# Patient Record
Sex: Female | Born: 1940 | Race: White | Hispanic: No | Marital: Married | Smoking: Former smoker
Health system: Southern US, Community
[De-identification: ages and names within clinical notes are randomized; demographics above are authoritative.]

## PROBLEM LIST (undated history)

## (undated) DIAGNOSIS — N200 Calculus of kidney: Secondary | ICD-10-CM

## (undated) DIAGNOSIS — R51 Headache: Secondary | ICD-10-CM

## (undated) DIAGNOSIS — Z5189 Encounter for other specified aftercare: Secondary | ICD-10-CM

## (undated) DIAGNOSIS — K219 Gastro-esophageal reflux disease without esophagitis: Secondary | ICD-10-CM

## (undated) DIAGNOSIS — I2510 Atherosclerotic heart disease of native coronary artery without angina pectoris: Secondary | ICD-10-CM

## (undated) DIAGNOSIS — IMO0001 Reserved for inherently not codable concepts without codable children: Secondary | ICD-10-CM

## (undated) DIAGNOSIS — M2550 Pain in unspecified joint: Secondary | ICD-10-CM

## (undated) DIAGNOSIS — D649 Anemia, unspecified: Secondary | ICD-10-CM

## (undated) DIAGNOSIS — I6529 Occlusion and stenosis of unspecified carotid artery: Secondary | ICD-10-CM

## (undated) DIAGNOSIS — M1990 Unspecified osteoarthritis, unspecified site: Secondary | ICD-10-CM

## (undated) DIAGNOSIS — H547 Unspecified visual loss: Secondary | ICD-10-CM

## (undated) DIAGNOSIS — I10 Essential (primary) hypertension: Secondary | ICD-10-CM

## (undated) DIAGNOSIS — E7800 Pure hypercholesterolemia, unspecified: Secondary | ICD-10-CM

## (undated) DIAGNOSIS — R0602 Shortness of breath: Secondary | ICD-10-CM

## (undated) HISTORY — DX: Pure hypercholesterolemia, unspecified: E78.00

## (undated) HISTORY — PX: EYE SURGERY: SHX253

## (undated) HISTORY — PX: CARDIAC CATHETERIZATION: SHX172

## (undated) HISTORY — DX: Unspecified osteoarthritis, unspecified site: M19.90

## (undated) HISTORY — DX: Occlusion and stenosis of unspecified carotid artery: I65.29

## (undated) HISTORY — DX: Essential (primary) hypertension: I10

## (undated) HISTORY — DX: Shortness of breath: R06.02

## (undated) HISTORY — PX: ABDOMINAL HYSTERECTOMY: SHX81

## (undated) HISTORY — PX: CORONARY ARTERY BYPASS GRAFT: SHX141

## (undated) HISTORY — DX: Pain in unspecified joint: M25.50

## (undated) HISTORY — PX: CAROTID ARTERY - SUBCLAVIAN ARTERY BYPASS GRAFT: SUR178

## (undated) HISTORY — DX: Anemia, unspecified: D64.9

## (undated) HISTORY — DX: Atherosclerotic heart disease of native coronary artery without angina pectoris: I25.10

## (undated) HISTORY — PX: PR VEIN BYPASS GRAFT,AORTO-FEM-POP: 35551

## (undated) HISTORY — PX: CARDIOVASCULAR STRESS TEST: SHX262

## (undated) HISTORY — PX: BREAST BIOPSY: SHX20

## (undated) HISTORY — DX: Unspecified visual loss: H54.7

---

## 2005-04-20 ENCOUNTER — Observation Stay (HOSPITAL_COMMUNITY): Admission: RE | Admit: 2005-04-20 | Discharge: 2005-04-20 | Payer: Self-pay | Admitting: Gastroenterology

## 2009-11-15 DIAGNOSIS — I2510 Atherosclerotic heart disease of native coronary artery without angina pectoris: Secondary | ICD-10-CM

## 2009-11-15 HISTORY — DX: Atherosclerotic heart disease of native coronary artery without angina pectoris: I25.10

## 2009-12-12 ENCOUNTER — Encounter: Admission: RE | Admit: 2009-12-12 | Discharge: 2009-12-12 | Payer: Self-pay | Admitting: Family Medicine

## 2009-12-23 ENCOUNTER — Inpatient Hospital Stay (HOSPITAL_COMMUNITY): Admission: EM | Admit: 2009-12-23 | Discharge: 2010-01-05 | Payer: Self-pay | Admitting: Emergency Medicine

## 2009-12-24 ENCOUNTER — Ambulatory Visit: Payer: Self-pay | Admitting: Surgery

## 2009-12-25 ENCOUNTER — Encounter: Payer: Self-pay | Admitting: Surgery

## 2009-12-25 ENCOUNTER — Encounter (INDEPENDENT_AMBULATORY_CARE_PROVIDER_SITE_OTHER): Payer: Self-pay | Admitting: Cardiovascular Disease

## 2010-01-27 ENCOUNTER — Encounter
Admission: RE | Admit: 2010-01-27 | Discharge: 2010-01-27 | Payer: Self-pay | Source: Home / Self Care | Attending: Surgery | Admitting: Surgery

## 2010-01-27 ENCOUNTER — Ambulatory Visit: Payer: Self-pay | Admitting: Surgery

## 2010-02-11 ENCOUNTER — Ambulatory Visit: Payer: Self-pay | Admitting: Surgery

## 2010-02-28 ENCOUNTER — Emergency Department (HOSPITAL_COMMUNITY)
Admission: EM | Admit: 2010-02-28 | Discharge: 2010-02-28 | Payer: Self-pay | Source: Home / Self Care | Admitting: Emergency Medicine

## 2010-03-02 LAB — CBC
HCT: 38.1 % (ref 36.0–46.0)
Hemoglobin: 12.7 g/dL (ref 12.0–15.0)
MCH: 30.7 pg (ref 26.0–34.0)
MCHC: 33.3 g/dL (ref 30.0–36.0)
MCV: 92 fL (ref 78.0–100.0)
Platelets: 263 10*3/uL (ref 150–400)
RBC: 4.14 MIL/uL (ref 3.87–5.11)
RDW: 15 % (ref 11.5–15.5)
WBC: 8.5 10*3/uL (ref 4.0–10.5)

## 2010-03-02 LAB — DIFFERENTIAL
Basophils Absolute: 0 10*3/uL (ref 0.0–0.1)
Basophils Relative: 1 % (ref 0–1)
Eosinophils Absolute: 0.2 10*3/uL (ref 0.0–0.7)
Eosinophils Relative: 3 % (ref 0–5)
Lymphocytes Relative: 26 % (ref 12–46)
Lymphs Abs: 2.2 10*3/uL (ref 0.7–4.0)
Monocytes Absolute: 0.4 10*3/uL (ref 0.1–1.0)
Monocytes Relative: 5 % (ref 3–12)
Neutro Abs: 5.6 10*3/uL (ref 1.7–7.7)
Neutrophils Relative %: 67 % (ref 43–77)

## 2010-03-02 LAB — BASIC METABOLIC PANEL
BUN: 16 mg/dL (ref 6–23)
CO2: 23 mEq/L (ref 19–32)
Calcium: 9.9 mg/dL (ref 8.4–10.5)
Chloride: 104 mEq/L (ref 96–112)
Creatinine, Ser: 0.97 mg/dL (ref 0.4–1.2)
GFR calc Af Amer: >60 mL/min (ref >60)
GFR calc non Af Amer: 57 mL/min — ABNORMAL LOW (ref >60)
Glucose, Bld: 117 mg/dL — ABNORMAL HIGH (ref 70–99)
Potassium: 4.1 mEq/L (ref 3.5–5.1)
Sodium: 136 mEq/L (ref 135–145)

## 2010-03-16 ENCOUNTER — Ambulatory Visit: Admit: 2010-03-16 | Payer: Self-pay | Admitting: Surgery

## 2010-03-16 ENCOUNTER — Ambulatory Visit
Admission: RE | Admit: 2010-03-16 | Discharge: 2010-03-16 | Payer: Self-pay | Source: Home / Self Care | Attending: Surgery | Admitting: Surgery

## 2010-03-17 NOTE — Assessment & Plan Note (Signed)
OFFICE VISIT  Jennifer [name], Jennifer [name] DOB:  [birth year]                                       03/16/2010 NWGNF#:[id number]  The patient returns today of followup.  She is status post coronary artery bypass graft x4 in combination of arch reconstruction consisting of aorta to innominate aorta left carotid and aorta left subclavian bypass graft.  This was done on 12/30/2009.  By preoperative imaging she was found to have bilateral carotid stenosis which we did not address at the time of her arch reconstruction.  She comes back in today for followup of her carotid disease to be done with ultrasound.  In the meantime she has been sent to be seen by ENT to make sure she had full vocal cord function.  She was cleared by ENT.  She recently went to the hospital for severe headache.  She had a CT angiogram of her head to rule out subarachnoid hemorrhage.  This was negative.  She still is having 1 to 2 headaches a day.  She is not having any fever.  She does have some night sweats.  She is scheduled to see a neurologist and headache specialist at the end of February.  PHYSICAL EXAMINATION:  She has palpable radial pulses bilaterally.  Her incision is well-healed.  Neurologically she is intact.  Carotid duplex was performed today that shows bilateral 60% to 79% stenosis.  ASSESSMENT:  Status post coronary artery bypass graft and aortic arch reconstruction, bilateral carotid disease.  Based on her duplex today she has bilateral carotid stenosis in the 60% to 79% range.  I believe that she is asymptomatic at this time.  I would recommend continued medical management of her carotid occlusive disease with plans for followup in 6 months with a repeat ultrasound.  I do not feel that her headaches are related to her carotid disease.  I agree with having her see a headache specialist in the near future.  This has been scheduled by Dr. Nathanial Rancher.  The patient is concerned that  her metoprolol may be causing her symptoms.  Therefore I agreed to switch her metoprolol to atenolol.  I plan on seeing her back in 6 months.    Jorge Ny, MD Electronically Signed  VWB/MEDQ  D:  03/16/2010  T:  03/17/2010  Job:  [id number]

## 2010-03-30 NOTE — Procedures (Unsigned)
CAROTID DUPLEX EXAM  INDICATION:  Carotid stenosis.  HISTORY: Diabetes:  No. Cardiac:  CABG x4. Hypertension:  Yes. Smoking:  Previous, quit November 2011. Previous Surgery:  No. CV History:  Currently complaining of excruciating headaches. Amaurosis Fugax No, Paresthesias No, Hemiparesis No Other:  Hyperlipidemia.                                      RIGHT             LEFT Brachial systolic pressure:         154               149 Brachial Doppler waveforms:         Normal            Normal Vertebral direction of flow:        Antegrade         Antegrade DUPLEX VELOCITIES (cm/sec) CCA peak systolic                   57                69 ECA peak systolic                   95                187 ICA peak systolic                   215               236 ICA end diastolic                   101               83 PLAQUE MORPHOLOGY:                  Mixed             Mixed PLAQUE AMOUNT:                      Moderate          Moderate PLAQUE LOCATION:                    CCA, ICA, ECA     CCA, ICA, ECA  IMPRESSION: 1. Bilateral internal carotid artery velocities suggest 60% to 70%     stenosis. 2. Left external carotid artery stenosis. 3. Right common carotid artery stenosis, without any hemodynamically     significant velocities.  ___________________________________________ V. Charlena Cross, MD  EM/MEDQ  D:  03/16/2010  T:  03/16/2010  Job:  [id number]

## 2010-04-28 LAB — FIBRINOGEN: Fibrinogen: 227 mg/dL (ref 204–475)

## 2010-04-28 LAB — POCT I-STAT 3, ART BLOOD GAS (G3+)
Acid-base deficit: 6 mmol/L — ABNORMAL HIGH (ref 0.0–2.0)
Bicarbonate: 22.3 mEq/L (ref 20.0–24.0)
Bicarbonate: 22.6 mEq/L (ref 20.0–24.0)
Bicarbonate: 24.8 mEq/L — ABNORMAL HIGH (ref 20.0–24.0)
O2 Saturation: 100 %
O2 Saturation: 95 %
Patient temperature: 36
TCO2: 20 mmol/L (ref 0–100)
TCO2: 23 mmol/L (ref 0–100)
pCO2 arterial: 34.6 mmHg — ABNORMAL LOW (ref 35.0–45.0)
pCO2 arterial: 45.3 mmHg — ABNORMAL HIGH (ref 35.0–45.0)
pCO2 arterial: 56.7 mmHg — ABNORMAL HIGH (ref 35.0–45.0)
pH, Arterial: 7.201 — ABNORMAL LOW (ref 7.350–7.400)
pH, Arterial: 7.346 — ABNORMAL LOW (ref 7.350–7.400)
pH, Arterial: 7.347 — ABNORMAL LOW (ref 7.350–7.400)
pO2, Arterial: 394 mmHg — ABNORMAL HIGH (ref 80.0–100.0)
pO2, Arterial: 79 mmHg — ABNORMAL LOW (ref 80.0–100.0)
pO2, Arterial: 98 mmHg (ref 80.0–100.0)

## 2010-04-28 LAB — LIPID PANEL
Cholesterol: 175 mg/dL (ref 0–200)
Cholesterol: 190 mg/dL (ref 0–200)
LDL Cholesterol: 100 mg/dL — ABNORMAL HIGH (ref 0–99)
LDL Cholesterol: 127 mg/dL — ABNORMAL HIGH (ref 0–99)
Triglycerides: 129 mg/dL (ref <150)
Triglycerides: 151 mg/dL — ABNORMAL HIGH (ref <150)
VLDL: 30 mg/dL (ref 0–40)

## 2010-04-28 LAB — CBC
HCT: 27.8 % — ABNORMAL LOW (ref 36.0–46.0)
HCT: 28.1 % — ABNORMAL LOW (ref 36.0–46.0)
HCT: 33.9 % — ABNORMAL LOW (ref 36.0–46.0)
HCT: 34.3 % — ABNORMAL LOW (ref 36.0–46.0)
HCT: 35.9 % — ABNORMAL LOW (ref 36.0–46.0)
HCT: 36.9 % (ref 36.0–46.0)
HCT: 37.7 % (ref 36.0–46.0)
HCT: 37.8 % (ref 36.0–46.0)
HCT: 38.6 % (ref 36.0–46.0)
HCT: 40.1 % (ref 36.0–46.0)
HCT: 41.6 % (ref 36.0–46.0)
HCT: 41.7 % (ref 36.0–46.0)
Hemoglobin: 11.7 g/dL — ABNORMAL LOW (ref 12.0–15.0)
Hemoglobin: 12.3 g/dL (ref 12.0–15.0)
Hemoglobin: 13.1 g/dL (ref 12.0–15.0)
Hemoglobin: 14.5 g/dL (ref 12.0–15.0)
Hemoglobin: 14.5 g/dL (ref 12.0–15.0)
Hemoglobin: 9.1 g/dL — ABNORMAL LOW (ref 12.0–15.0)
MCH: 31.3 pg (ref 26.0–34.0)
MCH: 31.4 pg (ref 26.0–34.0)
MCH: 31.4 pg (ref 26.0–34.0)
MCH: 31.8 pg (ref 26.0–34.0)
MCH: 31.8 pg (ref 26.0–34.0)
MCH: 32 pg (ref 26.0–34.0)
MCH: 32.2 pg (ref 26.0–34.0)
MCH: 32.3 pg (ref 26.0–34.0)
MCH: 32.3 pg (ref 26.0–34.0)
MCH: 32.3 pg (ref 26.0–34.0)
MCHC: 33.9 g/dL (ref 30.0–36.0)
MCHC: 34.2 g/dL (ref 30.0–36.0)
MCHC: 34.3 g/dL (ref 30.0–36.0)
MCHC: 34.4 g/dL (ref 30.0–36.0)
MCHC: 34.4 g/dL (ref 30.0–36.0)
MCHC: 34.7 g/dL (ref 30.0–36.0)
MCHC: 34.8 g/dL (ref 30.0–36.0)
MCHC: 34.8 g/dL (ref 30.0–36.0)
MCHC: 34.9 g/dL (ref 30.0–36.0)
MCHC: 35.5 g/dL (ref 30.0–36.0)
MCV: 88.4 fL (ref 78.0–100.0)
MCV: 90.2 fL (ref 78.0–100.0)
MCV: 90.6 fL (ref 78.0–100.0)
MCV: 91.5 fL (ref 78.0–100.0)
MCV: 92.1 fL (ref 78.0–100.0)
MCV: 92.4 fL (ref 78.0–100.0)
MCV: 92.7 fL (ref 78.0–100.0)
MCV: 92.7 fL (ref 78.0–100.0)
MCV: 92.7 fL (ref 78.0–100.0)
MCV: 92.9 fL (ref 78.0–100.0)
MCV: 92.9 fL (ref 78.0–100.0)
MCV: 93.6 fL (ref 78.0–100.0)
Platelets: 115 10*3/uL — ABNORMAL LOW (ref 150–400)
Platelets: 133 10*3/uL — ABNORMAL LOW (ref 150–400)
Platelets: 169 10*3/uL (ref 150–400)
Platelets: 222 10*3/uL (ref 150–400)
Platelets: 226 10*3/uL (ref 150–400)
Platelets: 262 10*3/uL (ref 150–400)
Platelets: 279 10*3/uL (ref 150–400)
Platelets: 90 10*3/uL — ABNORMAL LOW (ref 150–400)
RBC: 2.86 MIL/uL — ABNORMAL LOW (ref 3.87–5.11)
RBC: 2.95 MIL/uL — ABNORMAL LOW (ref 3.87–5.11)
RBC: 3.07 MIL/uL — ABNORMAL LOW (ref 3.87–5.11)
RBC: 3.67 MIL/uL — ABNORMAL LOW (ref 3.87–5.11)
RBC: 3.98 MIL/uL (ref 3.87–5.11)
RBC: 4 MIL/uL (ref 3.87–5.11)
RBC: 4.19 MIL/uL (ref 3.87–5.11)
RBC: 4.49 MIL/uL (ref 3.87–5.11)
RBC: 4.49 MIL/uL (ref 3.87–5.11)
RDW: 12.4 % (ref 11.5–15.5)
RDW: 12.4 % (ref 11.5–15.5)
RDW: 12.4 % (ref 11.5–15.5)
RDW: 12.5 % (ref 11.5–15.5)
RDW: 12.5 % (ref 11.5–15.5)
RDW: 12.6 % (ref 11.5–15.5)
RDW: 12.6 % (ref 11.5–15.5)
RDW: 13.1 % (ref 11.5–15.5)
RDW: 13.4 % (ref 11.5–15.5)
RDW: 13.5 % (ref 11.5–15.5)
RDW: 13.7 % (ref 11.5–15.5)
RDW: 14 % (ref 11.5–15.5)
WBC: 6.4 10*3/uL (ref 4.0–10.5)
WBC: 6.7 10*3/uL (ref 4.0–10.5)
WBC: 7.2 10*3/uL (ref 4.0–10.5)
WBC: 7.2 10*3/uL (ref 4.0–10.5)
WBC: 7.5 10*3/uL (ref 4.0–10.5)
WBC: 8.2 10*3/uL (ref 4.0–10.5)
WBC: 9.9 10*3/uL (ref 4.0–10.5)

## 2010-04-28 LAB — GLUCOSE, CAPILLARY
Glucose-Capillary: 108 mg/dL — ABNORMAL HIGH (ref 70–99)
Glucose-Capillary: 111 mg/dL — ABNORMAL HIGH (ref 70–99)
Glucose-Capillary: 122 mg/dL — ABNORMAL HIGH (ref 70–99)
Glucose-Capillary: 128 mg/dL — ABNORMAL HIGH (ref 70–99)
Glucose-Capillary: 148 mg/dL — ABNORMAL HIGH (ref 70–99)
Glucose-Capillary: 157 mg/dL — ABNORMAL HIGH (ref 70–99)
Glucose-Capillary: 160 mg/dL — ABNORMAL HIGH (ref 70–99)

## 2010-04-28 LAB — BASIC METABOLIC PANEL
BUN: 12 mg/dL (ref 6–23)
BUN: 14 mg/dL (ref 6–23)
BUN: 14 mg/dL (ref 6–23)
BUN: 19 mg/dL (ref 6–23)
BUN: 20 mg/dL (ref 6–23)
BUN: 20 mg/dL (ref 6–23)
BUN: 23 mg/dL (ref 6–23)
CO2: 23 mEq/L (ref 19–32)
CO2: 24 mEq/L (ref 19–32)
CO2: 26 mEq/L (ref 19–32)
CO2: 29 mEq/L (ref 19–32)
CO2: 30 mEq/L (ref 19–32)
CO2: 31 mEq/L (ref 19–32)
Calcium: 7 mg/dL — ABNORMAL LOW (ref 8.4–10.5)
Calcium: 8.2 mg/dL — ABNORMAL LOW (ref 8.4–10.5)
Calcium: 8.4 mg/dL (ref 8.4–10.5)
Chloride: 101 mEq/L (ref 96–112)
Chloride: 107 mEq/L (ref 96–112)
Chloride: 108 mEq/L (ref 96–112)
Chloride: 108 mEq/L (ref 96–112)
Chloride: 95 mEq/L — ABNORMAL LOW (ref 96–112)
Creatinine, Ser: 0.74 mg/dL (ref 0.4–1.2)
Creatinine, Ser: 0.82 mg/dL (ref 0.4–1.2)
Creatinine, Ser: 0.9 mg/dL (ref 0.4–1.2)
Creatinine, Ser: 1.01 mg/dL (ref 0.4–1.2)
Creatinine, Ser: 1.03 mg/dL (ref 0.4–1.2)
GFR calc Af Amer: >60 mL/min (ref >60)
GFR calc non Af Amer: >60 mL/min (ref >60)
GFR calc non Af Amer: >60 mL/min (ref >60)
Glucose, Bld: 127 mg/dL — ABNORMAL HIGH (ref 70–99)
Glucose, Bld: 130 mg/dL — ABNORMAL HIGH (ref 70–99)
Glucose, Bld: 84 mg/dL (ref 70–99)
Glucose, Bld: 93 mg/dL (ref 70–99)
Potassium: 3.3 mEq/L — ABNORMAL LOW (ref 3.5–5.1)
Potassium: 3.7 mEq/L (ref 3.5–5.1)
Potassium: 3.8 mEq/L (ref 3.5–5.1)
Potassium: 4 mEq/L (ref 3.5–5.1)

## 2010-04-28 LAB — HEPARIN LEVEL (UNFRACTIONATED)
Heparin Unfractionated: 0.44 IU/mL (ref 0.30–0.70)
Heparin Unfractionated: 0.8 IU/mL — ABNORMAL HIGH (ref 0.30–0.70)

## 2010-04-28 LAB — DIFFERENTIAL
Basophils Absolute: 0 10*3/uL (ref 0.0–0.1)
Basophils Relative: 0 % (ref 0–1)
Eosinophils Absolute: 0.2 10*3/uL (ref 0.0–0.7)
Eosinophils Relative: 3 % (ref 0–5)
Lymphocytes Relative: 34 % (ref 12–46)
Lymphs Abs: 2.5 10*3/uL (ref 0.7–4.0)
Monocytes Absolute: 0.4 10*3/uL (ref 0.1–1.0)
Monocytes Relative: 6 % (ref 3–12)
Neutro Abs: 4 10*3/uL (ref 1.7–7.7)
Neutrophils Relative %: 56 % (ref 43–77)

## 2010-04-28 LAB — POCT I-STAT, CHEM 8
BUN: 19 mg/dL (ref 6–23)
Calcium, Ion: 1.01 mmol/L — ABNORMAL LOW (ref 1.12–1.32)
Calcium, Ion: 1.12 mmol/L (ref 1.12–1.32)
Chloride: 108 mEq/L (ref 96–112)
Chloride: 109 mEq/L (ref 96–112)
Creatinine, Ser: 0.9 mg/dL (ref 0.4–1.2)
Creatinine, Ser: 1.2 mg/dL (ref 0.4–1.2)
Glucose, Bld: 122 mg/dL — ABNORMAL HIGH (ref 70–99)
Glucose, Bld: 152 mg/dL — ABNORMAL HIGH (ref 70–99)
Glucose, Bld: 92 mg/dL (ref 70–99)
HCT: 25 % — ABNORMAL LOW (ref 36.0–46.0)
HCT: 36 % (ref 36.0–46.0)
Hemoglobin: 12.2 g/dL (ref 12.0–15.0)
Hemoglobin: 8.5 g/dL — ABNORMAL LOW (ref 12.0–15.0)
Sodium: 140 mEq/L (ref 135–145)
TCO2: 24 mmol/L (ref 0–100)
TCO2: 24 mmol/L (ref 0–100)

## 2010-04-28 LAB — POCT I-STAT 4, (NA,K, GLUC, HGB,HCT)
Glucose, Bld: 109 mg/dL — ABNORMAL HIGH (ref 70–99)
Glucose, Bld: 131 mg/dL — ABNORMAL HIGH (ref 70–99)
Glucose, Bld: 139 mg/dL — ABNORMAL HIGH (ref 70–99)
Glucose, Bld: 148 mg/dL — ABNORMAL HIGH (ref 70–99)
Glucose, Bld: 91 mg/dL (ref 70–99)
HCT: 21 % — ABNORMAL LOW (ref 36.0–46.0)
HCT: 28 % — ABNORMAL LOW (ref 36.0–46.0)
HCT: 34 % — ABNORMAL LOW (ref 36.0–46.0)
HCT: 36 % (ref 36.0–46.0)
Hemoglobin: 11.2 g/dL — ABNORMAL LOW (ref 12.0–15.0)
Hemoglobin: 11.6 g/dL — ABNORMAL LOW (ref 12.0–15.0)
Hemoglobin: 11.6 g/dL — ABNORMAL LOW (ref 12.0–15.0)
Hemoglobin: 12.2 g/dL (ref 12.0–15.0)
Hemoglobin: 9.5 g/dL — ABNORMAL LOW (ref 12.0–15.0)
Potassium: 3.6 mEq/L (ref 3.5–5.1)
Potassium: 3.9 mEq/L (ref 3.5–5.1)
Potassium: 4.3 mEq/L (ref 3.5–5.1)
Potassium: 5.9 mEq/L — ABNORMAL HIGH (ref 3.5–5.1)
Potassium: 6.5 mEq/L (ref 3.5–5.1)
Sodium: 135 mEq/L (ref 135–145)
Sodium: 135 mEq/L (ref 135–145)
Sodium: 138 mEq/L (ref 135–145)
Sodium: 139 mEq/L (ref 135–145)
Sodium: 140 mEq/L (ref 135–145)
Sodium: 142 mEq/L (ref 135–145)
Sodium: 142 mEq/L (ref 135–145)

## 2010-04-28 LAB — PROTIME-INR
INR: 0.94 (ref 0.00–1.49)
Prothrombin Time: 12.4 seconds (ref 11.6–15.2)
Prothrombin Time: 12.8 seconds (ref 11.6–15.2)

## 2010-04-28 LAB — CROSSMATCH
ABO/RH(D): O POS
Antibody Screen: NEGATIVE
Unit division: 0
Unit division: 0

## 2010-04-28 LAB — TROPONIN I: Troponin I: 0.01 ng/mL (ref 0.00–0.06)

## 2010-04-28 LAB — CK TOTAL AND CKMB (NOT AT ARMC)
CK, MB: 1.1 ng/mL (ref 0.3–4.0)
Relative Index: INVALID (ref 0.0–2.5)
Total CK: 34 U/L (ref 7–177)

## 2010-04-28 LAB — BLOOD GAS, ARTERIAL
Acid-base deficit: 1.5 mmol/L (ref 0.0–2.0)
TCO2: 23.8 mmol/L (ref 0–100)
pCO2 arterial: 37.8 mmHg (ref 35.0–45.0)

## 2010-04-28 LAB — MAGNESIUM
Magnesium: 2 mg/dL (ref 1.5–2.5)
Magnesium: 2.1 mg/dL (ref 1.5–2.5)

## 2010-04-28 LAB — COMPREHENSIVE METABOLIC PANEL
Alkaline Phosphatase: 66 U/L (ref 39–117)
BUN: 17 mg/dL (ref 6–23)
CO2: 27 mEq/L (ref 19–32)
Chloride: 101 mEq/L (ref 96–112)
Creatinine, Ser: 0.95 mg/dL (ref 0.4–1.2)
GFR calc non Af Amer: 59 mL/min — ABNORMAL LOW (ref >60)
Glucose, Bld: 104 mg/dL — ABNORMAL HIGH (ref 70–99)
Potassium: 4.4 mEq/L (ref 3.5–5.1)
Total Bilirubin: 0.3 mg/dL (ref 0.3–1.2)

## 2010-04-28 LAB — CREATININE, SERUM
Creatinine, Ser: 0.83 mg/dL (ref 0.4–1.2)
GFR calc Af Amer: >60 mL/min (ref >60)
GFR calc non Af Amer: >60 mL/min (ref >60)
GFR calc non Af Amer: >60 mL/min (ref >60)

## 2010-04-28 LAB — CARDIAC PANEL(CRET KIN+CKTOT+MB+TROPI)
CK, MB: 1 ng/mL (ref 0.3–4.0)
CK, MB: 1 ng/mL (ref 0.3–4.0)
Relative Index: INVALID (ref 0.0–2.5)
Troponin I: 0.03 ng/mL (ref 0.00–0.06)
Troponin I: <0.01 ng/mL (ref 0.00–0.06)

## 2010-04-28 LAB — MRSA PCR SCREENING: MRSA by PCR: NEGATIVE

## 2010-04-28 LAB — POCT CARDIAC MARKERS
Myoglobin, poc: 67 ng/mL (ref 12–200)
Troponin i, poc: <0.05 ng/mL (ref 0.00–0.09)
Troponin i, poc: <0.05 ng/mL (ref 0.00–0.09)

## 2010-04-28 LAB — POTASSIUM
Potassium: 4.1 mEq/L (ref 3.5–5.1)
Potassium: 4.3 mEq/L (ref 3.5–5.1)

## 2010-04-28 LAB — HEMOGLOBIN A1C
Hgb A1c MFr Bld: 6 % — ABNORMAL HIGH (ref <5.7)
Mean Plasma Glucose: 126 mg/dL — ABNORMAL HIGH (ref <117)

## 2010-04-28 LAB — APTT
aPTT: 30 seconds (ref 24–37)
aPTT: 38 seconds — ABNORMAL HIGH (ref 24–37)

## 2010-04-28 LAB — PREPARE PLATELETS

## 2010-06-30 NOTE — Assessment & Plan Note (Signed)
OFFICE VISIT   Jennifer [name], Jennifer [name]  DOB:  11/10/1940                                       02/11/2010  QIWLN#:[id number]   This patient returns today for follow-up.  She is status post coronary  bypass graft times 4 in combination with arch reconstruction which  consisted of an aorta to innominate,  aorta left carotid and left  subclavian bypass graft.  This was done in conjunction with Dr. [name] on 12/30/2009.  She had uncomplicated postoperative course.  However, did develop postoperative atrial fibrillation which was treated  with amiodarone.  When she saw Dr. Laneta Simmers in her postoperative visit,  she was complaining of severe nausea.  Amiodarone has been held.  She is  back today stating that her nausea has gone away and she is starting to  regain her appetite.  She did have some hoarseness following her  operation but that is improving.  She is getting her strength back.   On examination her incisions are well-healed.  She has palpable radial  pulses bilaterally.  Neurologically she is intact.   At this point we need to reevaluate her bilateral carotid disease.  She  was found to have high-grade stenosis prior to her operation.  She is  not ready to have another surgery at this time.  I am going to give her  1 more month to recover.  In the meantime, I am going to have her  scoped by ENT to make sure she has full vocal cord function.  I am also  going to repeat her carotid ultrasound to confirm that her stenosis is  to the point where we would recommend operation.     Jorge Ny, MD  Electronically Signed   VWB/MEDQ  D:  02/11/2010  T:  02/11/2010  Job:  [id number]   cc:   [name], M.D.

## 2010-06-30 NOTE — Assessment & Plan Note (Signed)
OFFICE VISIT   Jennifer, [name]  DOB:  December 25, 1940                                        January 27, 2010  CHART #:  [id number]   HISTORY:  The patient returned to my office today for followup status  post coronary artery bypass graft surgery x4 in addition to aorto-  innominate, aorto-left carotid, and aorto-left subclavian bypass  performed in conjunction with Dr. Durene Cal on December 30, 2009.  Her postoperative course was fairly smooth, although she did develop  postoperative atrial fibrillation and was converted with amiodarone.  Since discharge, she said she has had fairly persistent nausea.  She  feels it is related to her medications and possibly her aspirin.  Her  amiodarone was decreased recently to 200 mg per day by Dr. Algie [name].  She  has been walking without chest pain or shortness of breath.  She has had  no dizziness and no focal neurologic symptoms.  She does note some  swelling in her right ankle as well as her left hand towards the end of  the day.  She said she has continued to abstain from smoking.   PHYSICAL EXAMINATION:  Today, her blood pressure is 131/74, pulse is 76  and regular, and respiratory rate is 20 unlabored.  Oxygen saturation on  room air is 94%.  She looks well.  Cardiac exam shows regular rate and  rhythm with normal bowel sounds.  Lung exam reveals a few crackles at  the left base.  The chest incision is healing well and the sternum is  stable.  The extension of the incision into the left supraclavicular  region is healing well.  The brachial and radial pulses are palpable and  equal bilaterally.  There is no upper or lower extremity edema.   DIAGNOSTIC TESTS:  A followup chest x-ray today shows improvement in  aeration of both lower lobes.  There has been improvement in her pleural  effusions with very small bilateral effusions remaining.   MEDICATIONS:  1. Amiodarone 200 mg daily.  2. Aspirin 81 mg daily.  3.  Lopressor 25 mg b.i.d.  4. Ultram p.r.n. for pain.  5. Crestor 20 mg nightly.  6. Coenzyme Q10 daily.  7. Fish oil daily.  8. Flaxseed oil daily.  9. Garlic extract daily.  10.Magnesium OTC daily.  11.Multivitamin daily.  12.Prevacid daily p.r.n.  13.Vitamin B12 daily.  14.Vitamin C daily.  15.Vitamin D3 daily.  16.Hydrocodone p.r.n. for pain.   IMPRESSION:  Overall, the patient is making a good recovery following  her surgery.  She appears to be maintaining sinus rhythm.  I suspect her  nausea is probably related to the amiodarone and I have asked her to  discontinue this completely.  If this nausea does not resolve over the  next week, then she can try holding her aspirin to see if that helps.  Also, I asked her to hold off taking any of her supplements until this  nausea completely resolves.  She said that this has really been slowing  down her recovery.  She will continue to follow up with Dr. Algie [name] for  cardiology care and Dr. Nathanial [name] for her general medical care.  She has a  followup appointment next week with Dr. Myra [name].  She does have  bilateral internal carotid artery stenosis.  I told  her she not need to  return to see me unless she develops problem with her incisions.  I told  her she can return to driving a car, but should refrain from lifting  anything heavier than 10 pounds for a total of 3 months from date of  surgery.   Jennifer [name], M.D.  Electronically Signed   BB/MEDQ  D:  01/27/2010  T:  01/28/2010  Job:  [id number]   cc:   Jennifer Ny, MD  Jennifer [name], M.D.  Jennifer Blanks, MD

## 2010-09-07 ENCOUNTER — Encounter: Payer: Self-pay | Admitting: Surgery

## 2010-09-28 ENCOUNTER — Other Ambulatory Visit (INDEPENDENT_AMBULATORY_CARE_PROVIDER_SITE_OTHER): Payer: Medicare Other

## 2010-09-28 ENCOUNTER — Ambulatory Visit: Payer: Self-pay | Admitting: Surgery

## 2010-09-28 DIAGNOSIS — Z48812 Encounter for surgical aftercare following surgery on the circulatory system: Secondary | ICD-10-CM

## 2010-09-28 DIAGNOSIS — I6529 Occlusion and stenosis of unspecified carotid artery: Secondary | ICD-10-CM

## 2010-10-05 NOTE — Procedures (Unsigned)
CAROTID DUPLEX EXAM  INDICATION:  Followup carotid stenosis.  HISTORY: Diabetes:  No. Cardiac:  CAD and CABG x4. Hypertension:  Yes. Smoking:  Previous. Previous Surgery:  Ascending aorta to innominate bypass graft, ascending aorta to left common carotid artery bypass graft and ascending aorta to left subclavian bypass graft on 12/30/2009. CV History:  Asymptomatic. Amaurosis Fugax No, Paresthesias No, Hemiparesis No                                      RIGHT             LEFT Brachial systolic pressure:         164               190 Brachial Doppler waveforms:         WNL               WNL Vertebral direction of flow:        Antegrade         Antegrade DUPLEX VELOCITIES (cm/sec) CCA peak systolic                   105               91 ECA peak systolic                   159               218 ICA peak systolic                   286               229 ICA end diastolic                   97                71 PLAQUE MORPHOLOGY:                  Heterogeneous     Heterogeneous PLAQUE AMOUNT:                      Moderate  to severe                 Moderate  to severe PLAQUE LOCATION:                    CCA, ICA, ECA     CCA, ICA, ECA  IMPRESSION: 1. Right internal carotid artery stenosis in the 60%-79% range (high     end of range). 2. Left internal carotid artery stenosis in the 60%-79% range. 3. Bilateral external carotid artery stenosis present. 4. Bilateral patent and antegrade vertebral arteries. 5. Essentially unchanged since study on 03/16/2010.        ___________________________________________ V. Charlena Cross, MD  SH/MEDQ  D:  09/28/2010  T:  09/28/2010  Job:  [id number]

## 2011-02-24 IMAGING — CR DG CHEST 2V
2 series · 2 of 2 positions shown · non-contrast
Comparison: Chest x-ray of 01/04/2010

CLINICAL DATA: Status post CABG, some shortness of breath

CHEST - 2 VIEW

[w chest pa]
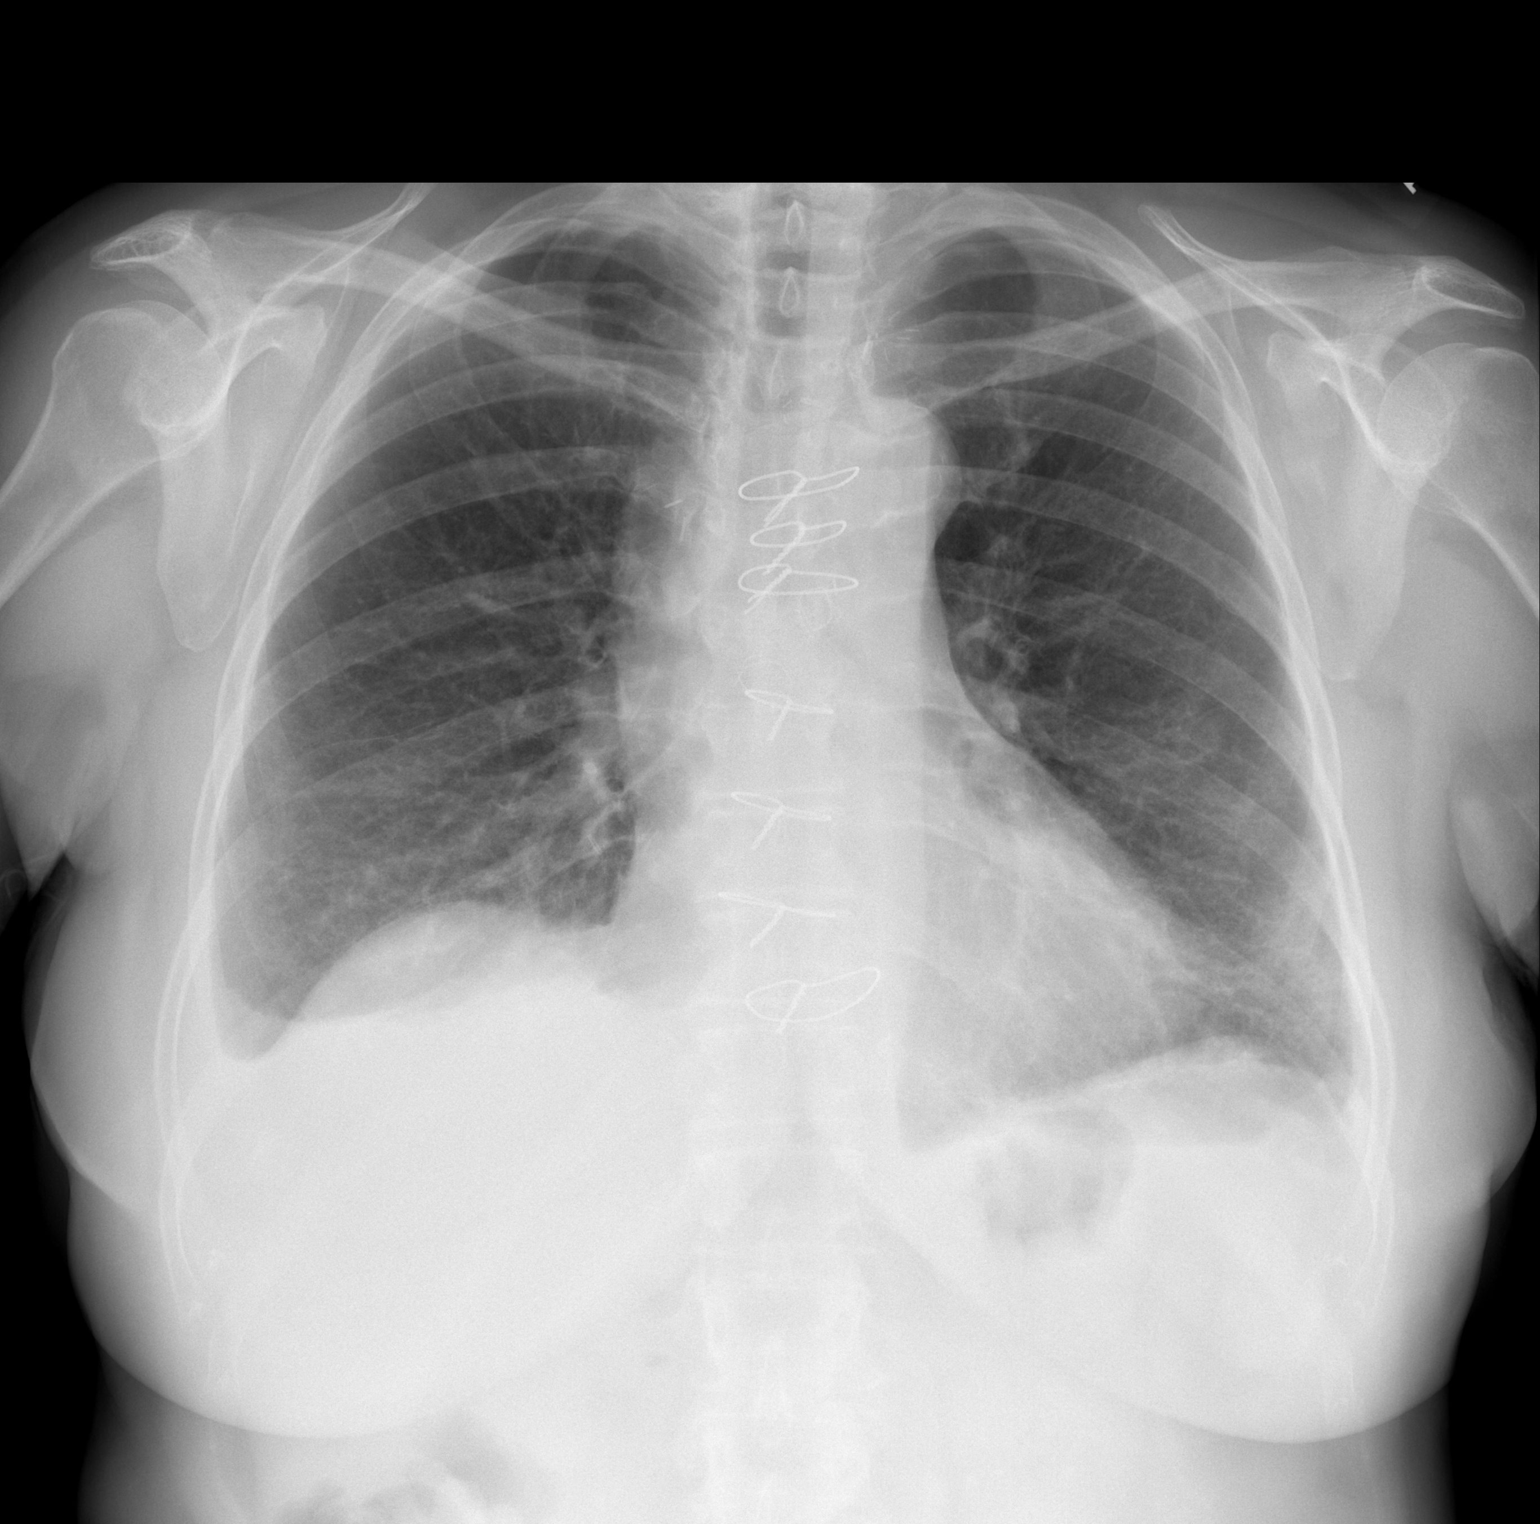

[w chest lat]
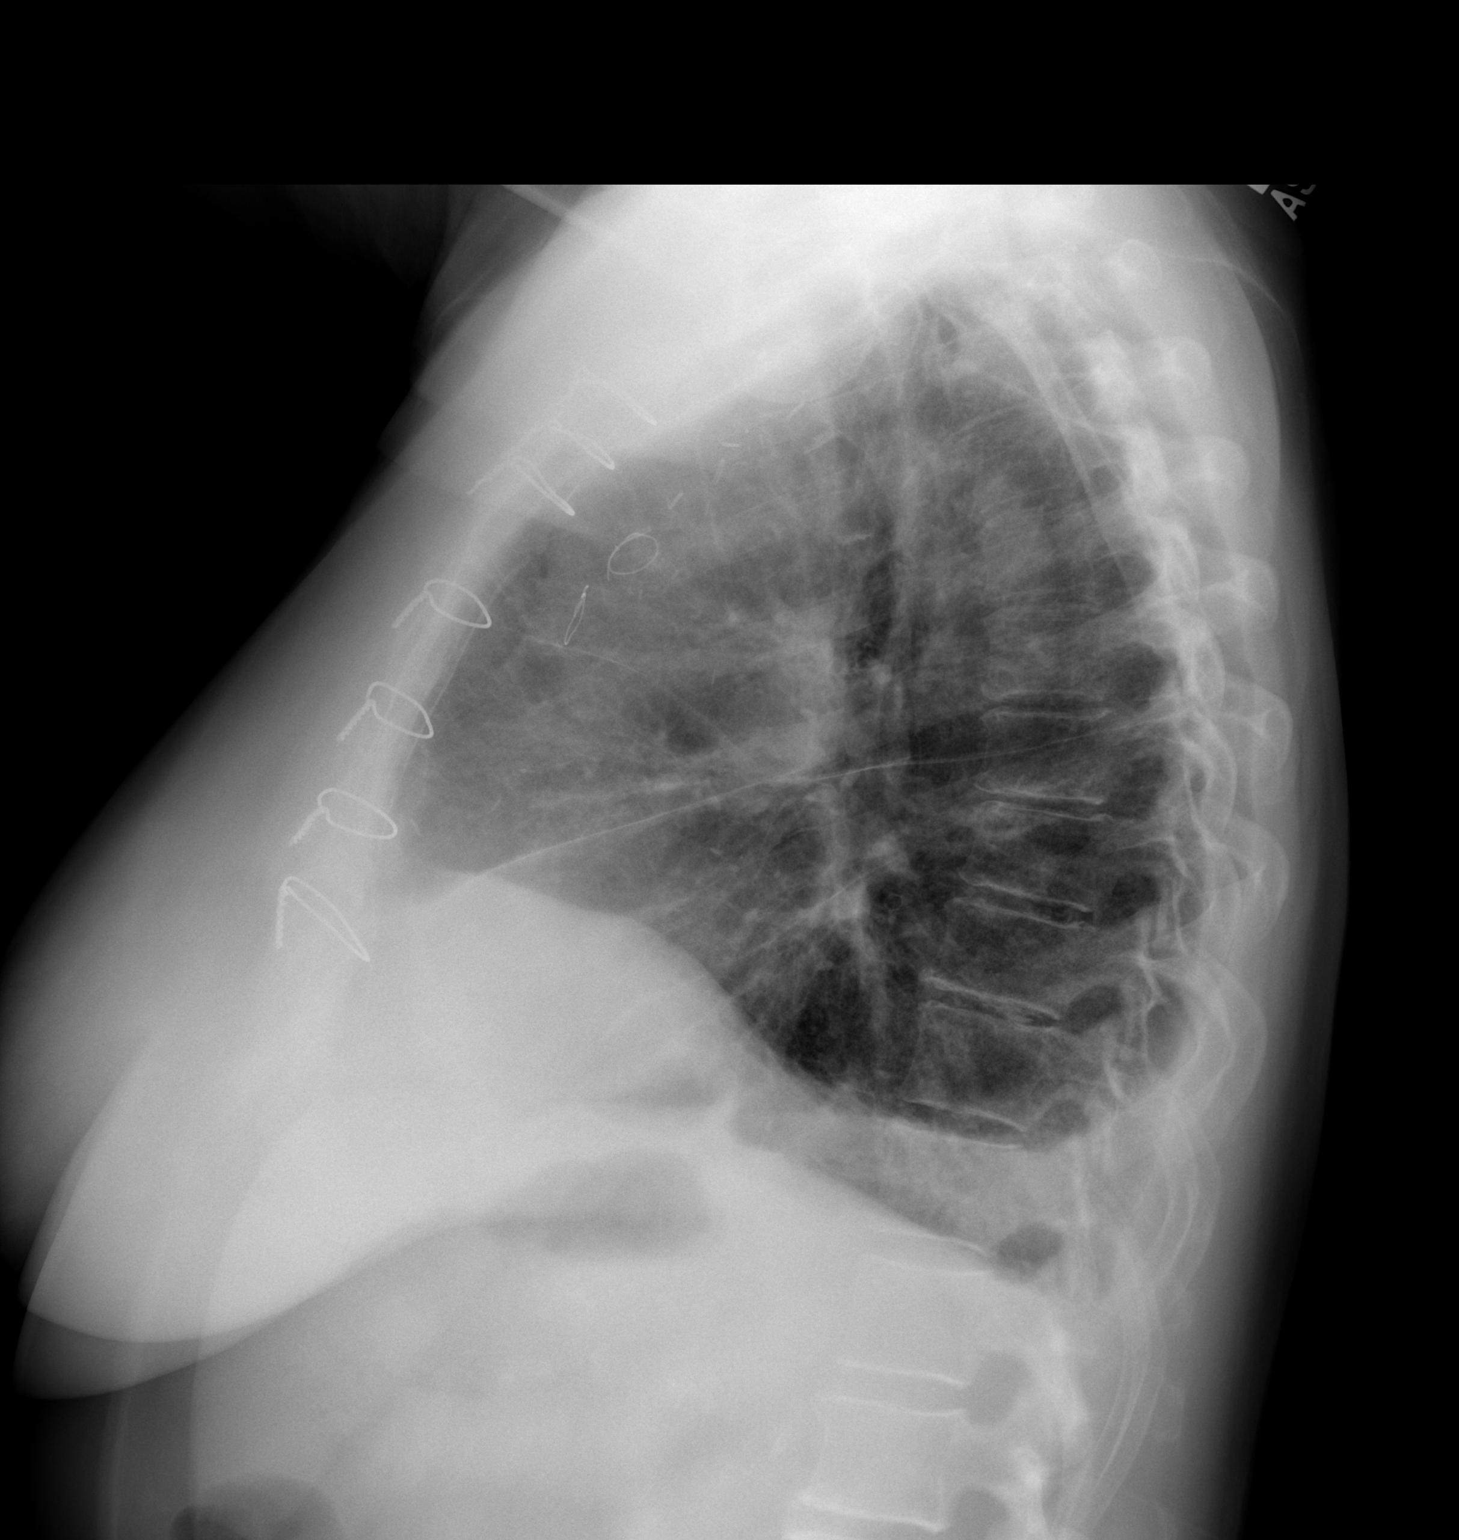

[2 of 2 positions shown; findings below may reference images not displayed]

FINDINGS: Aeration has improved with decrease in basilar
atelectasis.  Small pleural effusions have decreased in volume.
Heart size is stable.  Median sternotomy sutures appear intact.
IMPRESSION: Improved aeration with decrease in atelectasis and small effusions.

## 2011-03-16 ENCOUNTER — Other Ambulatory Visit: Payer: Self-pay | Admitting: Family Medicine

## 2011-03-16 DIAGNOSIS — Z1231 Encounter for screening mammogram for malignant neoplasm of breast: Secondary | ICD-10-CM

## 2011-03-25 ENCOUNTER — Ambulatory Visit: Payer: Medicare Other

## 2011-04-06 ENCOUNTER — Ambulatory Visit
Admission: RE | Admit: 2011-04-06 | Discharge: 2011-04-06 | Disposition: A | Discharge status: Home / Self Care | Payer: Medicare Other | Source: Ambulatory Visit | Attending: Family Medicine | Admitting: Family Medicine

## 2011-04-06 DIAGNOSIS — Z1231 Encounter for screening mammogram for malignant neoplasm of breast: Secondary | ICD-10-CM

## 2011-04-09 ENCOUNTER — Encounter: Payer: Self-pay | Admitting: Surgery

## 2011-04-12 ENCOUNTER — Encounter: Payer: Self-pay | Admitting: Surgery

## 2011-04-12 ENCOUNTER — Ambulatory Visit (INDEPENDENT_AMBULATORY_CARE_PROVIDER_SITE_OTHER): Payer: Medicare Other | Admitting: Surgery

## 2011-04-12 ENCOUNTER — Ambulatory Visit (INDEPENDENT_AMBULATORY_CARE_PROVIDER_SITE_OTHER): Payer: Medicare Other | Admitting: Vascular Surgery

## 2011-04-12 DIAGNOSIS — I6529 Occlusion and stenosis of unspecified carotid artery: Secondary | ICD-10-CM

## 2011-04-12 DIAGNOSIS — Z48812 Encounter for surgical aftercare following surgery on the circulatory system: Secondary | ICD-10-CM

## 2011-04-12 NOTE — Progress Notes (Signed)
[facility] [place]   Patient name: Jennifer [name] MRN: [id number] DOB: 06/15/1940 Sex: female     Chief Complaint  Patient presents with  . 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 She is status post a descending aorta to innominate left carotid and left subclavian artery bypass graft and November of 2011 by myself and Dr. Bartel. At that time she also underwent coronary artery bypass grafting. We have been following her for progressive extracranial carotid artery occlusive disease. She remained asymptomatic. Specifically, she denies numbness or weakness in either extremity. She denies slurred speech. She denies amaurosis fugax.  The patient struggled with hypercholesterolemia. She did get headaches from Crestor and has cut back on this. Her most recent LDL cholesterol is around 130. Her total cholesterol is slightly greater than 200.  Past Medical History  Diagnosis Date  . Arthritis   . Joint pain   . Eyesight diminished Change in eyesight  . Shortness of breath on exertion   . Hypertension   . Hypercholesterolemia   . Anemia   . Coronary artery disease Oct. 2011  . Carotid artery occlusion     Past Surgical History  Procedure Date  . Carotid artery - subclavian artery bypass graft     History   Social History  . Marital Status: Married    Spouse Name: N/A    Number of Children: N/A  . Years of Education: N/A   Occupational History  . Not on file.   Social History Main Topics  . Smoking status: Former Smoker    Quit date: 12/16/2009  . Smokeless tobacco: Not on file  . Alcohol Use: No  . Drug Use:   . Sexually Active:    Other Topics Concern  . Not on file   Social History Narrative  . No narrative on file    Family History  Problem Relation Age of Onset  . Diabetes Mother   . Hyperlipidemia Mother   . Hypertension Mother   . Diabetes  Sister   . Hyperlipidemia Sister   . Hypertension Sister   . Cancer Brother     Prostate  . Heart disease Brother   . Hypertension Brother     Allergies as of 04/12/2011 - Review Complete 04/12/2011  Allergen Reaction Noted  . Statins Anaphylaxis 04/12/2011  . Penicillins  09/07/2010    Current Outpatient Prescriptions on File Prior to Visit  Medication Sig Dispense Refill  . aspirin 325 MG EC tablet Take 81 mg by mouth 2 (two) times a week.       . Multiple Vitamin (MULTIVITAMIN) tablet Take 1 tablet by mouth daily.        . rosuvastatin (CRESTOR) 20 MG tablet Take 10 mg by mouth daily. At bedtime.      . furosemide (LASIX) 40 MG tablet Take 40 mg by mouth daily.        . potassium chloride 20 MEQ/15ML (10%) solution Take by mouth daily.        . traMADol (ULTRAM) 50 MG tablet Take 50 mg by mouth every 6 (six) hours as needed. Take one to two tablets every four to six hours as needed.          REVIEW OF SYSTEMS: Cardiovascular: No chest pain, chest pressure, palpitations, orthopnea, positive for shortness of breath with exertion No claudication or rest pain,  No   history of DVT or phlebitis. Pulmonary: No productive cough, asthma or wheezing. Neurologic: No weakness, paresthesias, aphasia, or amaurosis. No dizziness. Hematologic: No bleeding problems or clotting disorders. Musculoskeletal: No joint pain or joint swelling. Gastrointestinal: No blood in stool or hematemesis Genitourinary: No dysuria or hematuria. Psychiatric:: No history of major depression. Integumentary: No rashes or ulcers. Constitutional: No fever or chills.  PHYSICAL EXAMINATION:   Vital signs are There were no vitals taken for this visit. General: The patient appears their stated age. HEENT:  No gross abnormalities Pulmonary:  Non labored breathing Musculoskeletal: There are no major deformities. Neurologic: No focal weakness or paresthesias are detected, Skin: There are no ulcer or rashes  noted. Psychiatric: The patient has normal affect. Cardiovascular: There is a regular rate and rhythm without significant murmur appreciated. No carotid bruit   Diagnostic Studies Carotid duplex reveals progression of a right-sided stenosis this is now an 80-99% range. The left side remained in the 60-79% range. Her bifurcation is noted to be mid to high  Assessment: Asymptomatic high-grade right carotid stenosis Plan: The patient is been scheduled for right carotid endarterectomy on Thursday, March 21. We discussed the risks and benefits of surgery including the risk of stroke in the risk of nerve injury. All her questions were answered today. She is not a candidate for carotid stenting due to her aortic arch reconstruction.  Jennifer [name] IV, M.D. [facility] [place] Office: [phone] Pager:  [phone]   

## 2011-04-14 ENCOUNTER — Other Ambulatory Visit: Payer: Self-pay

## 2011-04-23 ENCOUNTER — Encounter (HOSPITAL_COMMUNITY): Payer: Self-pay | Admitting: Pharmacy Technician

## 2011-04-26 NOTE — Procedures (Unsigned)
CAROTID DUPLEX EXAM  INDICATION:  Carotid stenosis  HISTORY: Diabetes:  No Cardiac:  CAD; CABG x4 Hypertension:  Yes Smoking:  Previous Previous Surgery:  Ascending aorta to innominate, left common carotid artery and left subclavian artery bypass graft on 12/30/2009 CV History:  Asymptomatic Amaurosis Fugax No, Paresthesias No, Hemiparesis No                                      RIGHT             LEFT Brachial systolic pressure:         174               178 Brachial Doppler waveforms:         WNL               WNL Vertebral direction of flow:        Antegrade         Antegrade DUPLEX VELOCITIES (cm/sec) CCA peak systolic                   88                96 ECA peak systolic                   161               202 ICA peak systolic                   308               255 ICA end diastolic                   116               85 PLAQUE MORPHOLOGY:                  Calcified         Calcified PLAQUE AMOUNT:                      Severe            Moderate to severe PLAQUE LOCATION:                    CCA/ICA/ECA       CCA/ICA/ECA  IMPRESSION: 1. Right internal carotid artery stenosis present in the 80%-99%     range. 2. Bilateral external carotid artery stenosis present. 3. Left internal carotid artery stenosis present in the 60%-79% range. 4. Right side has increased and left side remains unchanged since     previous study on 09/28/2010.  ___________________________________________ V. Charlena Cross, MD  SH/MEDQ  D:  04/12/2011  T:  04/12/2011  Job:  [id number]

## 2011-04-27 ENCOUNTER — Encounter (HOSPITAL_COMMUNITY): Payer: Self-pay

## 2011-04-27 ENCOUNTER — Encounter (HOSPITAL_COMMUNITY)
Admission: RE | Admit: 2011-04-27 | Discharge: 2011-04-27 | Disposition: A | Discharge status: Home / Self Care | Payer: Medicare Other | Source: Ambulatory Visit | Attending: Anesthesiology | Admitting: Anesthesiology

## 2011-04-27 ENCOUNTER — Encounter (HOSPITAL_COMMUNITY)
Admission: RE | Admit: 2011-04-27 | Discharge: 2011-04-27 | Disposition: A | Discharge status: Home / Self Care | Payer: Medicare Other | Source: Ambulatory Visit | Attending: Surgery | Admitting: Surgery

## 2011-04-27 HISTORY — DX: Encounter for other specified aftercare: Z51.89

## 2011-04-27 HISTORY — DX: Gastro-esophageal reflux disease without esophagitis: K21.9

## 2011-04-27 HISTORY — DX: Headache: R51

## 2011-04-27 HISTORY — DX: Reserved for inherently not codable concepts without codable children: IMO0001

## 2011-04-27 LAB — TYPE AND SCREEN
ABO/RH(D): O POS
Antibody Screen: NEGATIVE

## 2011-04-27 LAB — DIFFERENTIAL
Basophils Absolute: 0 10*3/uL (ref 0.0–0.1)
Basophils Relative: 0 % (ref 0–1)
Lymphocytes Relative: 40 % (ref 12–46)
Monocytes Absolute: 0.4 10*3/uL (ref 0.1–1.0)
Neutro Abs: 3.6 10*3/uL (ref 1.7–7.7)
Neutrophils Relative %: 51 % (ref 43–77)

## 2011-04-27 LAB — URINALYSIS, ROUTINE W REFLEX MICROSCOPIC
Bilirubin Urine: NEGATIVE
Glucose, UA: NEGATIVE mg/dL
Hgb urine dipstick: NEGATIVE
Ketones, ur: NEGATIVE mg/dL
Protein, ur: NEGATIVE mg/dL
Urobilinogen, UA: 0.2 mg/dL (ref 0.0–1.0)

## 2011-04-27 LAB — COMPREHENSIVE METABOLIC PANEL
AST: 27 U/L (ref 0–37)
Albumin: 4.3 g/dL (ref 3.5–5.2)
BUN: 17 mg/dL (ref 6–23)
CO2: 27 mEq/L (ref 19–32)
Calcium: 10.1 mg/dL (ref 8.4–10.5)
Creatinine, Ser: 0.98 mg/dL (ref 0.50–1.10)
GFR calc non Af Amer: 58 mL/min — ABNORMAL LOW (ref >90)

## 2011-04-27 LAB — SURGICAL PCR SCREEN: MRSA, PCR: NEGATIVE

## 2011-04-27 LAB — PROTIME-INR
INR: 0.99 (ref 0.00–1.49)
Prothrombin Time: 13.3 seconds (ref 11.6–15.2)

## 2011-04-27 LAB — CBC
HCT: 40.8 % (ref 36.0–46.0)
MCH: 32.2 pg (ref 26.0–34.0)
MCV: 92.5 fL (ref 78.0–100.0)
Platelets: 242 10*3/uL (ref 150–400)
RDW: 12.7 % (ref 11.5–15.5)

## 2011-04-27 NOTE — Progress Notes (Signed)
WIL  REQUEST LAS OV, EKG, STRESS TEST, ECHO FROM DR [facility] OFFICE.

## 2011-04-27 NOTE — Pre-Procedure Instructions (Signed)
20 CYNDIA DEGRAFF  04/27/2011   Your procedure is scheduled on:   Thursday   05/06/11    Report to [facility] at 530 AM.  Call this number if you have problems the morning of surgery: [phone]   Remember:   Do not eat food:After Midnight.  May have clear liquids: up to 4 Hours before arrival.  Clear liquids include soda, tea, black coffee, apple or grape juice, broth.  Take these medicines the morning of surgery with A SIP OF WATER:   PREVACID, LOPRESSOR(METOPROLOL)    Do not wear jewelry, make-up or nail polish.  Do not wear lotions, powders, or perfumes. You may wear deodorant.  Do not shave 48 hours prior to surgery./  Do not bring valuables to the hospital.  Contacts, dentures or bridgework may not be worn into surgery.  Leave suitcase in the car. After surgery it may be brought to your room.  For patients admitted to the hospital, checkout time is 11:00 AM the day of discharge.   Patients discharged the day of surgery will not be allowed to drive home.  Name and phone number of your driver:   Special Instructions: CHG Shower Use Special Wash: 1/2 bottle night before surgery and 1/2 bottle morning of surgery.   Please read over the following fact sheets that you were given: Pain Booklet, MRSA Information and Surgical Site Infection Prevention

## 2011-05-05 MED ORDER — VANCOMYCIN HCL IN DEXTROSE 1-5 GM/200ML-% IV SOLN
1000.0000 mg | Freq: Once | INTRAVENOUS | Status: AC
  Administered 2011-05-06: 1000 mg via INTRAVENOUS
  Filled 2011-05-05: qty 200

## 2011-05-06 ENCOUNTER — Inpatient Hospital Stay (HOSPITAL_COMMUNITY)
Admission: RE | Admit: 2011-05-06 | Discharge: 2011-05-08 | DRG: 039 | Disposition: A | Discharge status: Home / Self Care | Payer: Medicare Other | Source: Ambulatory Visit | Attending: Surgery | Admitting: Surgery

## 2011-05-06 ENCOUNTER — Encounter (HOSPITAL_COMMUNITY): Payer: Self-pay | Admitting: *Deleted

## 2011-05-06 ENCOUNTER — Ambulatory Visit (HOSPITAL_COMMUNITY): Payer: Medicare Other | Admitting: Anesthesiology

## 2011-05-06 ENCOUNTER — Encounter (HOSPITAL_COMMUNITY): Payer: Self-pay | Admitting: Anesthesiology

## 2011-05-06 ENCOUNTER — Encounter (HOSPITAL_COMMUNITY)
Admission: RE | Disposition: A | Discharge status: Home / Self Care | Payer: Self-pay | Source: Ambulatory Visit | Attending: Surgery

## 2011-05-06 DIAGNOSIS — Z87442 Personal history of urinary calculi: Secondary | ICD-10-CM

## 2011-05-06 DIAGNOSIS — I6529 Occlusion and stenosis of unspecified carotid artery: Secondary | ICD-10-CM

## 2011-05-06 DIAGNOSIS — R29810 Facial weakness: Secondary | ICD-10-CM | POA: Diagnosis not present

## 2011-05-06 DIAGNOSIS — I10 Essential (primary) hypertension: Secondary | ICD-10-CM | POA: Diagnosis present

## 2011-05-06 DIAGNOSIS — R51 Headache: Secondary | ICD-10-CM | POA: Diagnosis not present

## 2011-05-06 DIAGNOSIS — Z880 Allergy status to penicillin: Secondary | ICD-10-CM

## 2011-05-06 DIAGNOSIS — Z888 Allergy status to other drugs, medicaments and biological substances status: Secondary | ICD-10-CM

## 2011-05-06 DIAGNOSIS — K219 Gastro-esophageal reflux disease without esophagitis: Secondary | ICD-10-CM | POA: Diagnosis present

## 2011-05-06 DIAGNOSIS — Z01812 Encounter for preprocedural laboratory examination: Secondary | ICD-10-CM

## 2011-05-06 DIAGNOSIS — Z87891 Personal history of nicotine dependence: Secondary | ICD-10-CM

## 2011-05-06 DIAGNOSIS — R112 Nausea with vomiting, unspecified: Secondary | ICD-10-CM | POA: Diagnosis not present

## 2011-05-06 DIAGNOSIS — E7800 Pure hypercholesterolemia, unspecified: Secondary | ICD-10-CM | POA: Diagnosis present

## 2011-05-06 HISTORY — PX: ENDARTERECTOMY: SHX5162

## 2011-05-06 SURGERY — ENDARTERECTOMY, CAROTID
Anesthesia: General | Site: Neck | Laterality: Right | Wound class: Clean

## 2011-05-06 MED ORDER — NEOSTIGMINE METHYLSULFATE 1 MG/ML IJ SOLN
INTRAMUSCULAR | Status: DC | PRN
  Administered 2011-05-06: 3 mg via INTRAVENOUS

## 2011-05-06 MED ORDER — HEPARIN SODIUM (PORCINE) 1000 UNIT/ML IJ SOLN
INTRAMUSCULAR | Status: DC | PRN
  Administered 2011-05-06: 6000 [IU] via INTRAVENOUS

## 2011-05-06 MED ORDER — MAGNESIUM 250 MG PO TABS: 1.0000 | ORAL_TABLET | Freq: Every day | ORAL | Status: DC

## 2011-05-06 MED ORDER — HYDRALAZINE HCL 20 MG/ML IJ SOLN: 10.0000 mg | INTRAMUSCULAR | Status: DC | PRN

## 2011-05-06 MED ORDER — HEPARIN SODIUM (PORCINE) 5000 UNIT/ML IJ SOLN
INTRAMUSCULAR | Status: DC | PRN
  Administered 2011-05-06: 09:00:00

## 2011-05-06 MED ORDER — MIDAZOLAM HCL 2 MG/2ML IJ SOLN: 0.5000 mg | Freq: Once | INTRAMUSCULAR | Status: DC | PRN

## 2011-05-06 MED ORDER — EPHEDRINE SULFATE 50 MG/ML IJ SOLN
INTRAMUSCULAR | Status: DC | PRN
  Administered 2011-05-06: 15 mg via INTRAVENOUS

## 2011-05-06 MED ORDER — DOPAMINE-DEXTROSE 3.2-5 MG/ML-% IV SOLN: 3.0000 ug/kg/min | INTRAVENOUS | Status: DC

## 2011-05-06 MED ORDER — MAGNESIUM SULFATE 40 MG/ML IJ SOLN
2.0000 g | Freq: Once | INTRAMUSCULAR | Status: AC | PRN
  Filled 2011-05-06: qty 50

## 2011-05-06 MED ORDER — MORPHINE SULFATE 2 MG/ML IJ SOLN
2.0000 mg | INTRAMUSCULAR | Status: DC | PRN
  Administered 2011-05-06: 2 mg via INTRAVENOUS

## 2011-05-06 MED ORDER — FENTANYL CITRATE 0.05 MG/ML IJ SOLN
INTRAMUSCULAR | Status: AC
  Filled 2011-05-06: qty 2

## 2011-05-06 MED ORDER — KETOROLAC TROMETHAMINE 30 MG/ML IJ SOLN
15.0000 mg | Freq: Once | INTRAMUSCULAR | Status: AC | PRN
  Administered 2011-05-06: 15 mg via INTRAVENOUS

## 2011-05-06 MED ORDER — ROSUVASTATIN CALCIUM 10 MG PO TABS
10.0000 mg | ORAL_TABLET | ORAL | Status: DC
  Filled 2011-05-06: qty 1

## 2011-05-06 MED ORDER — ADULT MULTIVITAMIN W/MINERALS CH
1.0000 | ORAL_TABLET | Freq: Every day | ORAL | Status: DC
  Filled 2011-05-06 (×3): qty 1

## 2011-05-06 MED ORDER — CYANOCOBALAMIN 500 MCG PO TABS
500.0000 ug | ORAL_TABLET | Freq: Every day | ORAL | Status: DC
  Filled 2011-05-06 (×3): qty 1

## 2011-05-06 MED ORDER — SODIUM CHLORIDE 0.9 % IV SOLN
INTRAVENOUS | Status: DC
  Administered 2011-05-06: 09:00:00 via INTRAVENOUS

## 2011-05-06 MED ORDER — PANTOPRAZOLE SODIUM 20 MG PO TBEC
20.0000 mg | DELAYED_RELEASE_TABLET | Freq: Every day | ORAL | Status: DC
  Filled 2011-05-06 (×2): qty 1

## 2011-05-06 MED ORDER — FAMOTIDINE IN NACL 20-0.9 MG/50ML-% IV SOLN
20.0000 mg | Freq: Two times a day (BID) | INTRAVENOUS | Status: DC
  Administered 2011-05-06 – 2011-05-07 (×3): 20 mg via INTRAVENOUS
  Filled 2011-05-06 (×5): qty 50

## 2011-05-06 MED ORDER — PROMETHAZINE HCL 25 MG/ML IJ SOLN: 6.2500 mg | INTRAMUSCULAR | Status: DC | PRN

## 2011-05-06 MED ORDER — PROPOFOL 10 MG/ML IV BOLUS
INTRAVENOUS | Status: DC | PRN
  Administered 2011-05-06: 120 mg via INTRAVENOUS

## 2011-05-06 MED ORDER — MEPERIDINE HCL 25 MG/ML IJ SOLN: 6.2500 mg | INTRAMUSCULAR | Status: DC | PRN

## 2011-05-06 MED ORDER — SENNOSIDES-DOCUSATE SODIUM 8.6-50 MG PO TABS
1.0000 | ORAL_TABLET | Freq: Every evening | ORAL | Status: DC | PRN
  Filled 2011-05-06: qty 1

## 2011-05-06 MED ORDER — PHENOL 1.4 % MT LIQD
1.0000 | OROMUCOSAL | Status: DC | PRN
  Administered 2011-05-06: 1 via OROMUCOSAL
  Filled 2011-05-06: qty 177

## 2011-05-06 MED ORDER — LABETALOL HCL 5 MG/ML IV SOLN
10.0000 mg | INTRAVENOUS | Status: DC | PRN
Start: 2011-05-06 — End: 2011-05-08
  Administered 2011-05-07: 10 mg via INTRAVENOUS
  Filled 2011-05-06: qty 4

## 2011-05-06 MED ORDER — 0.9 % SODIUM CHLORIDE (POUR BTL) OPTIME
TOPICAL | Status: DC | PRN
  Administered 2011-05-06: 1000 mL

## 2011-05-06 MED ORDER — ASPIRIN EC 81 MG PO TBEC: 81.0000 mg | DELAYED_RELEASE_TABLET | ORAL | Status: DC

## 2011-05-06 MED ORDER — QUERCETIN 50 MG PO TABS
1.0000 | ORAL_TABLET | Freq: Every day | ORAL | Status: DC
Start: 2011-05-06 — End: 2011-05-06

## 2011-05-06 MED ORDER — GUAIFENESIN-DM 100-10 MG/5ML PO SYRP: 15.0000 mL | ORAL_SOLUTION | ORAL | Status: DC | PRN

## 2011-05-06 MED ORDER — METOPROLOL TARTRATE 1 MG/ML IV SOLN: 2.0000 mg | INTRAVENOUS | Status: DC | PRN

## 2011-05-06 MED ORDER — CALCIUM CARBONATE 1250 (500 CA) MG PO TABS
1.0000 | ORAL_TABLET | Freq: Every day | ORAL | Status: DC
  Filled 2011-05-06 (×2): qty 1

## 2011-05-06 MED ORDER — ROCURONIUM BROMIDE 100 MG/10ML IV SOLN
INTRAVENOUS | Status: DC | PRN
  Administered 2011-05-06: 50 mg via INTRAVENOUS

## 2011-05-06 MED ORDER — OXYCODONE HCL 5 MG PO TABS
5.0000 mg | ORAL_TABLET | ORAL | Status: DC | PRN
  Administered 2011-05-06: 10 mg via ORAL
  Administered 2011-05-06: 5 mg via ORAL
  Administered 2011-05-07: 10 mg via ORAL
  Filled 2011-05-06 (×2): qty 2
  Filled 2011-05-06: qty 1

## 2011-05-06 MED ORDER — ONDANSETRON HCL 4 MG/2ML IJ SOLN
INTRAMUSCULAR | Status: DC | PRN
  Administered 2011-05-06: 4 mg via INTRAVENOUS

## 2011-05-06 MED ORDER — FENTANYL CITRATE 0.05 MG/ML IJ SOLN
25.0000 ug | INTRAMUSCULAR | Status: DC | PRN
  Administered 2011-05-06: 25 ug via INTRAVENOUS

## 2011-05-06 MED ORDER — SODIUM CHLORIDE 0.9 % IV SOLN
500.0000 mL | Freq: Once | INTRAVENOUS | Status: AC | PRN
  Administered 2011-05-06 (×2): 500 mL via INTRAVENOUS

## 2011-05-06 MED ORDER — LACTATED RINGERS IV SOLN
INTRAVENOUS | Status: DC | PRN
  Administered 2011-05-06: 07:00:00 via INTRAVENOUS

## 2011-05-06 MED ORDER — SODIUM CHLORIDE 0.9 % IV SOLN
10.0000 mg | INTRAVENOUS | Status: DC | PRN
  Administered 2011-05-06: 25 ug/min via INTRAVENOUS

## 2011-05-06 MED ORDER — MAGNESIUM OXIDE 400 MG PO TABS
200.0000 mg | ORAL_TABLET | Freq: Every day | ORAL | Status: DC
  Filled 2011-05-06 (×2): qty 0.5

## 2011-05-06 MED ORDER — VITAMIN D3 25 MCG (1000 UNIT) PO TABS
1000.0000 [IU] | ORAL_TABLET | ORAL | Status: DC
  Filled 2011-05-06: qty 1

## 2011-05-06 MED ORDER — LIDOCAINE HCL (CARDIAC) 20 MG/ML IV SOLN
INTRAVENOUS | Status: DC | PRN
  Administered 2011-05-06: 50 mg via INTRAVENOUS

## 2011-05-06 MED ORDER — FENTANYL CITRATE 0.05 MG/ML IJ SOLN
INTRAMUSCULAR | Status: DC | PRN
  Administered 2011-05-06: 250 ug via INTRAVENOUS

## 2011-05-06 MED ORDER — ONDANSETRON HCL 4 MG/2ML IJ SOLN
4.0000 mg | Freq: Four times a day (QID) | INTRAMUSCULAR | Status: DC | PRN
  Administered 2011-05-07: 4 mg via INTRAVENOUS
  Filled 2011-05-06 (×2): qty 2

## 2011-05-06 MED ORDER — ASPIRIN EC 325 MG PO TBEC
325.0000 mg | DELAYED_RELEASE_TABLET | Freq: Every day | ORAL | Status: DC
  Filled 2011-05-06 (×2): qty 1

## 2011-05-06 MED ORDER — DEXTROSE 5 % IV SOLN: 1.5000 g | Freq: Two times a day (BID) | INTRAVENOUS | Status: DC

## 2011-05-06 MED ORDER — DOCUSATE SODIUM 100 MG PO CAPS: 100.0000 mg | ORAL_CAPSULE | Freq: Every day | ORAL | Status: DC

## 2011-05-06 MED ORDER — ATORVASTATIN CALCIUM 10 MG PO TABS: 10.0000 mg | ORAL_TABLET | Freq: Every day | ORAL | Status: DC

## 2011-05-06 MED ORDER — PROTAMINE SULFATE 10 MG/ML IV SOLN
INTRAVENOUS | Status: DC | PRN
  Administered 2011-05-06: 40 mg via INTRAVENOUS
  Administered 2011-05-06: 10 mg via INTRAVENOUS

## 2011-05-06 MED ORDER — VANCOMYCIN HCL IN DEXTROSE 1-5 GM/200ML-% IV SOLN
1000.0000 mg | Freq: Two times a day (BID) | INTRAVENOUS | Status: AC
  Administered 2011-05-06 – 2011-05-07 (×2): 1000 mg via INTRAVENOUS
  Filled 2011-05-06 (×2): qty 200

## 2011-05-06 MED ORDER — POTASSIUM CHLORIDE CRYS ER 20 MEQ PO TBCR: 20.0000 meq | EXTENDED_RELEASE_TABLET | Freq: Once | ORAL | Status: AC | PRN

## 2011-05-06 MED ORDER — CO-ENZYME Q-10 50 MG PO CAPS: 100.0000 mg | ORAL_CAPSULE | Freq: Every day | ORAL | Status: DC

## 2011-05-06 MED ORDER — MORPHINE SULFATE 2 MG/ML IJ SOLN
INTRAMUSCULAR | Status: AC
  Administered 2011-05-06: 2 mg via INTRAVENOUS
  Filled 2011-05-06: qty 1

## 2011-05-06 MED ORDER — HEMOSTATIC AGENTS (NO CHARGE) OPTIME
TOPICAL | Status: DC | PRN
  Administered 2011-05-06: 1 via TOPICAL

## 2011-05-06 MED ORDER — ACETAMINOPHEN 650 MG RE SUPP
325.0000 mg | RECTAL | Status: DC | PRN
  Administered 2011-05-07: 650 mg via RECTAL
  Filled 2011-05-06: qty 1

## 2011-05-06 MED ORDER — VITAMIN B-12 500 MCG PO TABS: 500.0000 ug | ORAL_TABLET | Freq: Every day | ORAL | Status: DC

## 2011-05-06 MED ORDER — SODIUM CHLORIDE 0.9 % IV SOLN
INTRAVENOUS | Status: DC
  Administered 2011-05-07: 22:00:00 via INTRAVENOUS
  Administered 2011-05-07: 100 mL via INTRAVENOUS
  Administered 2011-05-07: 100 mL/h via INTRAVENOUS

## 2011-05-06 MED ORDER — GLYCOPYRROLATE 0.2 MG/ML IJ SOLN
INTRAMUSCULAR | Status: DC | PRN
  Administered 2011-05-06: .4 mg via INTRAVENOUS
  Administered 2011-05-06: .2 mg via INTRAVENOUS

## 2011-05-06 MED ORDER — ACETAMINOPHEN 325 MG PO TABS
325.0000 mg | ORAL_TABLET | ORAL | Status: DC | PRN
  Administered 2011-05-07: 650 mg via ORAL
  Filled 2011-05-06: qty 2

## 2011-05-06 MED ORDER — METOPROLOL TARTRATE 25 MG PO TABS
25.0000 mg | ORAL_TABLET | Freq: Every day | ORAL | Status: DC
  Filled 2011-05-06: qty 1

## 2011-05-06 MED ORDER — ACETAMINOPHEN 325 MG PO TABS: 325.0000 mg | ORAL_TABLET | ORAL | Status: DC | PRN

## 2011-05-06 SURGICAL SUPPLY — 48 items
ADH SKN CLS APL DERMABOND .7 (GAUZE/BANDAGES/DRESSINGS) ×1
CANISTER SUCTION 2500CC (MISCELLANEOUS) ×2 IMPLANT
CATH ROBINSON RED A/P 18FR (CATHETERS) ×2 IMPLANT
CATH SUCT 10FR WHISTLE TIP (CATHETERS) ×2 IMPLANT
CLIP TI MEDIUM 24 (CLIP) ×2 IMPLANT
CLIP TI WIDE RED SMALL 24 (CLIP) ×2 IMPLANT
CLOTH BEACON ORANGE TIMEOUT ST (SAFETY) ×2 IMPLANT
COVER SURGICAL LIGHT HANDLE (MISCELLANEOUS) ×4 IMPLANT
CRADLE DONUT ADULT HEAD (MISCELLANEOUS) ×2 IMPLANT
DERMABOND ADVANCED (GAUZE/BANDAGES/DRESSINGS) ×1
DERMABOND ADVANCED .7 DNX12 (GAUZE/BANDAGES/DRESSINGS) ×1 IMPLANT
DRAIN CHANNEL 15F RND FF W/TCR (WOUND CARE) IMPLANT
DRAPE WARM FLUID 44X44 (DRAPE) ×2 IMPLANT
ELECT REM PT RETURN 9FT ADLT (ELECTROSURGICAL) ×2
ELECTRODE REM PT RTRN 9FT ADLT (ELECTROSURGICAL) ×1 IMPLANT
EVACUATOR SILICONE 100CC (DRAIN) IMPLANT
GLOVE BIOGEL PI IND STRL 6.5 (GLOVE) ×4 IMPLANT
GLOVE BIOGEL PI IND STRL 7.5 (GLOVE) ×2 IMPLANT
GLOVE BIOGEL PI INDICATOR 6.5 (GLOVE) ×4
GLOVE BIOGEL PI INDICATOR 7.5 (GLOVE) ×2
GLOVE ORTHOPEDIC STR SZ6.5 (GLOVE) ×2 IMPLANT
GLOVE SS BIOGEL STRL SZ 7 (GLOVE) ×1 IMPLANT
GLOVE SUPERSENSE BIOGEL SZ 7 (GLOVE) ×1
GLOVE SURG SS PI 7.5 STRL IVOR (GLOVE) ×2 IMPLANT
GOWN PREVENTION PLUS XXLARGE (GOWN DISPOSABLE) ×2 IMPLANT
GOWN STRL NON-REIN LRG LVL3 (GOWN DISPOSABLE) ×6 IMPLANT
HEMOSTAT SNOW SURGICEL 2X4 (HEMOSTASIS) ×2 IMPLANT
HEMOSTAT SURGICEL 2X14 (HEMOSTASIS) IMPLANT
INSERT FOGARTY SM (MISCELLANEOUS) IMPLANT
KIT BASIN OR (CUSTOM PROCEDURE TRAY) ×2 IMPLANT
KIT ROOM TURNOVER OR (KITS) ×2 IMPLANT
NS IRRIG 1000ML POUR BTL (IV SOLUTION) ×4 IMPLANT
PACK CAROTID (CUSTOM PROCEDURE TRAY) ×2 IMPLANT
PAD ARMBOARD 7.5X6 YLW CONV (MISCELLANEOUS) ×4 IMPLANT
PATCH VASCULAR VASCU GUARD 1X6 (Vascular Products) ×2 IMPLANT
SHUNT CAROTID BYPASS 10 (VASCULAR PRODUCTS) IMPLANT
SHUNT CAROTID BYPASS 12FRX15.5 (VASCULAR PRODUCTS) IMPLANT
SPECIMEN JAR SMALL (MISCELLANEOUS) ×2 IMPLANT
SUT ETHILON 3 0 PS 1 (SUTURE) IMPLANT
SUT PROLENE 6 0 BV (SUTURE) ×4 IMPLANT
SUT PROLENE 7 0 BV 1 (SUTURE) IMPLANT
SUT VIC AB 3-0 SH 27 (SUTURE) ×4
SUT VIC AB 3-0 SH 27X BRD (SUTURE) ×2 IMPLANT
SUT VICRYL 4-0 PS2 18IN ABS (SUTURE) ×2 IMPLANT
TOWEL OR 17X24 6PK STRL BLUE (TOWEL DISPOSABLE) ×2 IMPLANT
TOWEL OR 17X26 10 PK STRL BLUE (TOWEL DISPOSABLE) ×2 IMPLANT
TRAY FOLEY CATH 14FRSI W/METER (CATHETERS) ×2 IMPLANT
WATER STERILE IRR 1000ML POUR (IV SOLUTION) ×2 IMPLANT

## 2011-05-06 NOTE — Interval H&P Note (Signed)
History and Physical Interval Note:  05/06/2011 7:30 AM  Jennifer [name]  has presented today for surgery, with the diagnosis of Right Internal Carotid Artery Stenosis  The various methods of treatment have been discussed with the patient and family. After consideration of risks, benefits and other options for treatment, the patient has consented to  Procedure(s) (LRB): ENDARTERECTOMY CAROTID (Right) as a surgical intervention .  The patients' history has been reviewed, patient examined, no change in status, stable for surgery.  I have reviewed the patients' chart and labs.  Questions were answered to the patient's satisfaction.     Taunia Frasco IV, V. WELLS

## 2011-05-06 NOTE — Op Note (Signed)
[facility] [place]  Patient name: Jennifer [name] MRN: [id number] DOB: 1940/08/27 Sex: female  05/06/2011 Pre-operative Diagnosis: Asymptomatic   right carotid stenosis Post-operative diagnosis:  Same Surgeon:  Jorge Ny Assistants:  Narda Amber Procedure:    right carotid Endarterectomy with bovine pericardial  patch angioplasty Anesthesia:  General Blood Loss:  See anesthesia record Specimens:  Carotid Plaque to pathology  Findings:  80 %stenosis; Thrombus:  none  Indications:  71 yo with Asx right carotid stenosis by ultrasound.  Procedure:  The patient was identified in the holding area and taken to [facility] OR ROOM 08  The patient was then placed supine on the table.   General endotrachial anesthesia was administered.  The patient was prepped and draped in the usual sterile fashion.  A time out was called and antibiotics were administered.  The incision was made along the anterior border of the right sternocleidomastoid muscle.  Cautery was used to dissect through the subcutaneous tissue.  The platysma muscle was divided with cautery.  The internal jugular vein was exposed along its anterior medial border.  The common facial vein was exposed and then divided between 2-0 silk ties and metal clips.  The common carotid artery was then circumferentially exposed and encircled with an umbilical tape.  The vagus nerve was identified and protected.  Next sharp dissection was used to expose the external carotid artery and the superior thyroid artery.  The were encircled with a blue vessel loop and a 2-0 silk tie respectively.  Finally, the internal carotid was carefully dissected free.  An umbilical tape was placed around the internal carotid artery distal to the diseased segment.  The hypoglossal nerve was visualized throughout and protected.  The patient was given systemic heparinization.  A bovine carotid patch was selected and prepared on the back table.  A 10 french shunt was  also prepared.  After blood pressure readings were appropriate and the heparin had been given time to circulate, the internal carotid artery was occluded with a baby Gregory clamp.  The external and common carotid arteries were then occluded with vascular clamps and the 2-0 tie tightened on the superior thyroid artery.  A #11 blade was used to make an arteriotomy in the common carotid artery.  This was extended with Potts scissors along the anterior and lateral border of the common and internal carotid artery.  Approximately 80% stenosis was identified.  There was no thrombus identified. Pulsatile backbleeding from the internal carotid was obtained.  No shunt was placed.   A kleiner kuntz elevator was used to perform endarterectomy.  An eversion endarterectomy was performed in the external carotid artery.  A good distal endpoint was obtained in the internal carotid artery.  The specimen was removed and sent to pathology.  Heparinized saline was used to irrigate the endarterectomized field.  All potential embolic debris was removed.  Bovine pericardial patch angioplasty was then performed using a running 6-0 Prolene. Just prior to completion of the repair, the shunt was removed. The common internal and external carotid arteries were all appropriately flushed. The artery was again irrigated with heparin saline.  The anastomosis was then secured. The clamp was first released on the external carotid artery followed by the common carotid artery approximately 30 seconds later, bloodflow was reestablish through the internal carotid artery.  Next, a hand-held  Doppler was used to evaluate the signals in the common, external, and internal  carotid arteries, all of which had appropriate signals.  I then administered  50 mg protamine. The wound was then irrigated.  After hemostasis was achieved, the carotid sheath was reapproximated with 3-0 Vicryl. The  platysma muscle was reapproximated with running 3-0 Vicryl. The skin    was closed with 4-0 Vicryl. Dermabond was placed on the skin. The  patient was then successfully extubated. His neurologic exam was  similar to his preprocedural exam. He was then taken to recovery room  in stable condition. There were no complications.     Disposition:  To PACU in stable condition.  Relevant Operative Details:  No shunt was placed.  Pulsatile backbleeding from the internal carotid  V. Durene Cal, M.D. [facility] [place] Office: [phone] Pager:  [phone]

## 2011-05-06 NOTE — Anesthesia Postprocedure Evaluation (Signed)
Anesthesia Post Note  Patient: Jennifer [name]  Procedure(s) Performed: Procedure(s) (LRB): ENDARTERECTOMY CAROTID (Right)  Anesthesia type: GA  Patient location: PACU  Post pain: Pain level controlled  Post assessment: Post-op Vital signs reviewed  Last Vitals:  Filed Vitals:   05/06/11 1009  BP: 124/69  Pulse: 82  Temp:   Resp: 16    Post vital signs: Reviewed  Level of consciousness: sedated  Complications: No apparent anesthesia complications

## 2011-05-06 NOTE — Anesthesia Procedure Notes (Signed)
Procedure Name: Intubation Date/Time: 05/06/2011 7:53 AM Performed by: Marena Chancy Pre-anesthesia Checklist: Patient identified, Emergency Drugs available, Suction available and Patient being monitored Patient Re-evaluated:Patient Re-evaluated prior to inductionOxygen Delivery Method: Circle system utilized Preoxygenation: Pre-oxygenation with 100% oxygen Intubation Type: IV induction Ventilation: Mask ventilation without difficulty Laryngoscope Size: Miller and 2 Grade View: Grade II Tube type: Oral Number of attempts: 1 Placement Confirmation: ETT inserted through vocal cords under direct vision,  positive ETCO2 and breath sounds checked- equal and bilateral Secured at: 21 cm Tube secured with: Tape Dental Injury: Teeth and Oropharynx as per pre-operative assessment

## 2011-05-06 NOTE — Preoperative (Signed)
Beta Blockers   Reason not to administer Beta Blockers:Not Applicable 

## 2011-05-06 NOTE — H&P (View-Only) (Signed)
[facility] [place]   Patient name: Jennifer [name] MRN: [id number] DOB: Apr 24, 1940 Sex: female     Chief Complaint  Patient presents with  . 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 She is status post a descending aorta to innominate left carotid and left subclavian artery bypass graft and November of 2011 by myself and Dr. Lavinia Sharps. At that time she also underwent coronary artery bypass grafting. We have been following her for progressive extracranial carotid artery occlusive disease. She remained asymptomatic. Specifically, she denies numbness or weakness in either extremity. She denies slurred speech. She denies amaurosis fugax.  The patient struggled with hypercholesterolemia. She did get headaches from Crestor and has cut back on this. Her most recent LDL cholesterol is around 409. Her total cholesterol is slightly greater than 200.  Past Medical History  Diagnosis Date  . Arthritis   . Joint pain   . Eyesight diminished Change in eyesight  . Shortness of breath on exertion   . Hypertension   . Hypercholesterolemia   . Anemia   . Coronary artery disease Oct. 2011  . Carotid artery occlusion     Past Surgical History  Procedure Date  . Carotid artery - subclavian artery bypass graft     History   Social History  . Marital Status: Married    Spouse Name: N/A    Number of Children: N/A  . Years of Education: N/A   Occupational History  . Not on file.   Social History Main Topics  . Smoking status: Former Smoker    Quit date: 12/16/2009  . Smokeless tobacco: Not on file  . Alcohol Use: No  . Drug Use:   . Sexually Active:    Other Topics Concern  . Not on file   Social History Narrative  . No narrative on file    Family History  Problem Relation Age of Onset  . Diabetes Mother   . Hyperlipidemia Mother   . Hypertension Mother   . Diabetes  Sister   . Hyperlipidemia Sister   . Hypertension Sister   . Cancer Brother     Prostate  . Heart disease Brother   . Hypertension Brother     Allergies as of 04/12/2011 - Review Complete 04/12/2011  Allergen Reaction Noted  . Statins Anaphylaxis 04/12/2011  . Penicillins  09/07/2010    Current Outpatient Prescriptions on File Prior to Visit  Medication Sig Dispense Refill  . aspirin 325 MG EC tablet Take 81 mg by mouth 2 (two) times a week.       . Multiple Vitamin (MULTIVITAMIN) tablet Take 1 tablet by mouth daily.        . rosuvastatin (CRESTOR) 20 MG tablet Take 10 mg by mouth daily. At bedtime.      . furosemide (LASIX) 40 MG tablet Take 40 mg by mouth daily.        . potassium chloride 20 MEQ/15ML (10%) solution Take by mouth daily.        . traMADol (ULTRAM) 50 MG tablet Take 50 mg by mouth every 6 (six) hours as needed. Take one to two tablets every four to six hours as needed.          REVIEW OF SYSTEMS: Cardiovascular: No chest pain, chest pressure, palpitations, orthopnea, positive for shortness of breath with exertion No claudication or rest pain,  No  history of DVT or phlebitis. Pulmonary: No productive cough, asthma or wheezing. Neurologic: No weakness, paresthesias, aphasia, or amaurosis. No dizziness. Hematologic: No bleeding problems or clotting disorders. Musculoskeletal: No joint pain or joint swelling. Gastrointestinal: No blood in stool or hematemesis Genitourinary: No dysuria or hematuria. Psychiatric:: No history of major depression. Integumentary: No rashes or ulcers. Constitutional: No fever or chills.  PHYSICAL EXAMINATION:   Vital signs are There were no vitals taken for this visit. General: The patient appears their stated age. HEENT:  No gross abnormalities Pulmonary:  Non labored breathing Musculoskeletal: There are no major deformities. Neurologic: No focal weakness or paresthesias are detected, Skin: There are no ulcer or rashes  noted. Psychiatric: The patient has normal affect. Cardiovascular: There is a regular rate and rhythm without significant murmur appreciated. No carotid bruit   Diagnostic Studies Carotid duplex reveals progression of a right-sided stenosis this is now an 80-99% range. The left side remained in the 60-79% range. Her bifurcation is noted to be mid to high  Assessment: Asymptomatic high-grade right carotid stenosis Plan: The patient is been scheduled for right carotid endarterectomy on Thursday, March 21. We discussed the risks and benefits of surgery including the risk of stroke in the risk of nerve injury. All her questions were answered today. She is not a candidate for carotid stenting due to her aortic arch reconstruction.  Jorge Ny, M.D. [facility] [place] Office: [phone] Pager:  [phone]

## 2011-05-06 NOTE — Anesthesia Preprocedure Evaluation (Signed)
Anesthesia Evaluation  Patient identified by MRN, date of birth, ID band Patient awake    Reviewed: Allergy & Precautions, H&P , Patient's Chart, lab work & pertinent test results, reviewed documented beta blocker date and time   History of Anesthesia Complications Negative for: history of anesthetic complications  Airway Mallampati: II TM Distance: >3 FB Neck ROM: full    Dental No notable dental hx.    Pulmonary neg pulmonary ROS, shortness of breath,  breath sounds clear to auscultation  Pulmonary exam normal       Cardiovascular Exercise Tolerance: Good hypertension, + CAD negative cardio ROS  Rhythm:regular Rate:Normal     Neuro/Psych  Headaches, negative neurological ROS  negative psych ROS   GI/Hepatic negative GI ROS, Neg liver ROS, GERD-  ,  Endo/Other  negative endocrine ROS  Renal/GU negative Renal ROS     Musculoskeletal   Abdominal   Peds  Hematology negative hematology ROS (+)   Anesthesia Other Findings Joint pain     Eyesight diminished Change in eyesight      Shortness of breath on exertion     Hypertension        Hypercholesterolemia     Coronary artery disease Oct. 2011      Carotid artery occlusion     Anemia        Blood transfusion   1965 GERD (gastroesophageal reflux disease)        No pertinent past medical history   HX KIDNEY STONES Headache        Arthritis    Reproductive/Obstetrics negative OB ROS                           Anesthesia Physical Anesthesia Plan  ASA: III  Anesthesia Plan: General ETT   Post-op Pain Management:    Induction:   Airway Management Planned:   Additional Equipment:   Intra-op Plan:   Post-operative Plan:   Informed Consent: I have reviewed the patients History and Physical, chart, labs and discussed the procedure including the risks, benefits and alternatives for the proposed anesthesia with the patient or authorized  representative who has indicated his/her understanding and acceptance.   Dental Advisory Given  Plan Discussed with: CRNA and Surgeon  Anesthesia Plan Comments:         Anesthesia Quick Evaluation

## 2011-05-06 NOTE — Transfer of Care (Signed)
Immediate Anesthesia Transfer of Care Note  Patient: Jennifer [name]  Procedure(s) Performed: Procedure(s) (LRB): ENDARTERECTOMY CAROTID (Right)  Patient Location: PACU  Anesthesia Type: General  Level of Consciousness: awake, alert , oriented and patient cooperative  Airway & Oxygen Therapy: Patient Spontanous Breathing and Patient connected to nasal cannula oxygen  Post-op Assessment: Report given to PACU RN, Post -op Vital signs reviewed and stable and Patient moving all extremities X 4  Post vital signs: Reviewed and stable  Complications: No apparent anesthesia complications

## 2011-05-06 NOTE — Progress Notes (Signed)
PHARMACIST - PHYSICIAN ORDER COMMUNICATION  CONCERNING: P&T Medication Policy on Herbal Medications  DESCRIPTION:  This patient's order for:  Quercetin and Coenzyme Q10  has been noted.  This product(s) is classified as an "herbal" or natural product. Due to a lack of definitive safety studies or FDA approval, nonstandard manufacturing practices, plus the potential risk of unknown drug-drug interactions while on inpatient medications, the Pharmacy and Therapeutics Committee does not permit the use of "herbal" or natural products of this type within [facility].   ACTION TAKEN: The pharmacy department is unable to verify this order at this time and your patient has been informed of this safety policy. Please reevaluate patient's clinical condition at discharge and address if the herbal or natural product(s) should be resumed at that time.  Dorna Leitz, PharmD, BCPS

## 2011-05-06 NOTE — Plan of Care (Signed)
Problem: Consults Goal: Diagnosis CEA/CES/AAA Stent Outcome: Completed/Met Date Met:  05/06/11 Carotid Endarterectomy (CEA)

## 2011-05-07 ENCOUNTER — Encounter (HOSPITAL_COMMUNITY): Payer: Self-pay | Admitting: Surgery

## 2011-05-07 LAB — BASIC METABOLIC PANEL
BUN: 13 mg/dL (ref 6–23)
Calcium: 8.2 mg/dL — ABNORMAL LOW (ref 8.4–10.5)
GFR calc Af Amer: 76 mL/min — ABNORMAL LOW (ref >90)
GFR calc non Af Amer: 66 mL/min — ABNORMAL LOW (ref >90)
Glucose, Bld: 89 mg/dL (ref 70–99)
Sodium: 141 mEq/L (ref 135–145)

## 2011-05-07 LAB — CBC
HCT: 33.1 % — ABNORMAL LOW (ref 36.0–46.0)
Hemoglobin: 11 g/dL — ABNORMAL LOW (ref 12.0–15.0)
MCH: 31.4 pg (ref 26.0–34.0)
MCHC: 33.2 g/dL (ref 30.0–36.0)
RDW: 13.4 % (ref 11.5–15.5)

## 2011-05-07 MED ORDER — SODIUM CHLORIDE 0.9 % IV BOLUS (SEPSIS)
1000.0000 mL | Freq: Once | INTRAVENOUS | Status: AC
  Administered 2011-05-07: 1000 mL via INTRAVENOUS

## 2011-05-07 MED ORDER — ONDANSETRON HCL 4 MG/2ML IJ SOLN
4.0000 mg | Freq: Once | INTRAMUSCULAR | Status: AC
  Administered 2011-05-07: 4 mg via INTRAVENOUS

## 2011-05-07 MED ORDER — METOPROLOL TARTRATE 25 MG PO TABS
25.0000 mg | ORAL_TABLET | Freq: Every day | ORAL | Status: DC
  Filled 2011-05-07: qty 1

## 2011-05-07 MED ORDER — PROMETHAZINE HCL 25 MG/ML IJ SOLN: 12.5000 mg | Freq: Four times a day (QID) | INTRAMUSCULAR | Status: DC | PRN

## 2011-05-07 NOTE — Progress Notes (Addendum)
VASCULAR AND VEIN SURGERY POST - OP CEA PROGRESS NOTE  Date of Surgery: 05/06/2011 Surgeon: Surgeon(s): Nada Libman, MD 1 Day Post-Op right Carotid Endarterectomy .  HPI: Jennifer [name] is a 71 y.o. female who is 1 Day Post-Op right Carotid Endarterectomy . Patient is doing well. Patient reports frontal headache; Patient denies difficulty swallowing; has had nausea and vomiting this am. States has been exposed to friends at church with stomach flu. Pt. denies weakness in upper or lower extremities; Pt. denies other symptoms of stroke or TIA.  IMAGING: No results found.  Significant Diagnostic Studies: CBC Lab Results  Component Value Date   WBC 5.9 05/07/2011   HGB 11.0* 05/07/2011   HCT 33.1* 05/07/2011   MCV 94.6 05/07/2011   PLT 179 05/07/2011    BMET    Component Value Date/Time   NA 141 05/07/2011 0400   K 3.9 05/07/2011 0400   CL 112 05/07/2011 0400   CO2 21 05/07/2011 0400   GLUCOSE 89 05/07/2011 0400   BUN 13 05/07/2011 0400   CREATININE 0.88 05/07/2011 0400   CALCIUM 8.2* 05/07/2011 0400   GFRNONAA 66* 05/07/2011 0400   GFRAA 76* 05/07/2011 0400    COAG Lab Results  Component Value Date   INR 0.99 04/27/2011   INR 1.64* 12/30/2009   INR 1.72* 12/30/2009   No results found for this basename: PTT      Intake/Output Summary (Last 24 hours) at 05/07/11 1200 Last data filed at 05/07/11 1000  Gross per 24 hour  Intake   2640 ml  Output   2400 ml  Net    240 ml    Physical Exam:  BP Readings from Last 3 Encounters:  05/07/11 178/89  05/07/11 178/89  04/27/11 165/87   Temp Readings from Last 3 Encounters:  05/07/11 99 F (37.2 C) Oral  05/07/11 99 F (37.2 C) Oral  04/27/11 97.2 F (36.2 C)    SpO2 Readings from Last 3 Encounters:  05/07/11 96%  05/07/11 96%  04/27/11 95%   Pulse Readings from Last 3 Encounters:  05/07/11 69  05/07/11 69  04/27/11 71    Pt is A&O x 3  Speech is normal right Neck Wound is healing well Patient with Negative  tongue deviation and min left facial droop Pt has good and equal strength in all extremities  Assessment: Jennifer [name] is a 71 y.o. female is S/P Right Carotid endarterectomy Positive nausea and vomiting this am - getting anti-emetics and fluid bolus H/A may be reperfusion vs sinus H/A Pt is voiding, able to swallow liquids without diff. Add phenergan if zofran ineffective Hold PO meds today    :Marlowe Shores [phone] 05/07/2011 12:00 PM   Agree with above sligh marginal mandibular neuropraxia Will keep tonight due to decreased energy and n/v  Wells Ninel Abdella

## 2011-05-07 NOTE — Progress Notes (Signed)
Up to bathroom back to bed and suddenly vomited approx. 100cc of undigested food. B/P is 178/86  HR=75. Placed back to bed,feels alittle better. Will notify MD.

## 2011-05-08 MED ORDER — TRAMADOL HCL 50 MG PO TABS: 50.0000 mg | ORAL_TABLET | Freq: Three times a day (TID) | ORAL | Status: AC | PRN

## 2011-05-08 MED ORDER — TRAMADOL HCL 50 MG PO TABS
50.0000 mg | ORAL_TABLET | Freq: Three times a day (TID) | ORAL | Status: DC | PRN
  Administered 2011-05-08: 50 mg via ORAL
  Filled 2011-05-08: qty 1

## 2011-05-08 NOTE — Progress Notes (Signed)
Discharge instructions given to pt and husband. Verbalized understanding of activity levels, wound care, follow-up appt. , s/s of stroke, s/s of potential complications and when to call doctor. Verbalized understanding of all discharge home meds. Renette Butters, Viona Gilmore

## 2011-05-08 NOTE — Discharge Summary (Signed)
[facility] Discharge Summary   Patient ID:  Jennifer [name] MRN: [id number] DOB/AGE: February 27, 1940 71 y.o.  Admit date: 05/06/2011 Discharge date: 05/08/2011 Date of Surgery: 05/06/2011 Surgeon: Surgeon(s): Nada Libman, MD  Admission Diagnosis: Right Internal Carotid Artery Stenosis  Discharge Diagnoses:  Right Internal Carotid Artery Stenosis  Secondary Diagnoses: Past Medical History  Diagnosis Date  . Joint pain   . Eyesight diminished Change in eyesight  . Shortness of breath on exertion   . Hypertension   . Hypercholesterolemia   . Coronary artery disease Oct. 2011  . Carotid artery occlusion   . Anemia   . Blood transfusion     1965  . GERD (gastroesophageal reflux disease)   . No pertinent past medical history     HX KIDNEY STONES  . Headache   . Arthritis     Procedure(s): ENDARTERECTOMY CAROTID  Discharged Condition: good  HPI:  Jennifer [name] is a 71 y.o. female who was seen by Dr. 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 She is status post a descending aorta to innominate left carotid and left subclavian artery bypass graft and November of 2011 by myself and Dr. Lavinia Sharps. At that time she also underwent coronary artery bypass grafting. We have been following her for progressive extracranial carotid artery occlusive disease. She remained asymptomatic. Specifically, she denies numbness or weakness in either extremity. She denies slurred speech. She denies amaurosis fugax.   Carotid duplex reveals progression of a right-sided stenosis this is now an 80-99% range. The left side remained in the 60-79% range. Her bifurcation is noted to be mid to high  Pt was admitted for elective right CEA   Hospital Course:  [name] is a 71 y.o. female is S/P Right Procedure(s): ENDARTERECTOMY CAROTID Extubated: POD # 0 Post-op wounds healing well Pt. Ambulating, voiding and taking PO diet without difficulty. Pt pain controlled with PO  pain meds. Labs as below Complications:nausea/ vomiting from pain meds Right mandibular nerve irritation sec retraction causing right chin numbness and facial droop - will improve with time H/A post-op, resolved  Consults:     Significant Diagnostic Studies: CBC Lab Results  Component Value Date   WBC 5.9 05/07/2011   HGB 11.0* 05/07/2011   HCT 33.1* 05/07/2011   MCV 94.6 05/07/2011   PLT 179 05/07/2011    BMET    Component Value Date/Time   NA 141 05/07/2011 0400   K 3.9 05/07/2011 0400   CL 112 05/07/2011 0400   CO2 21 05/07/2011 0400   GLUCOSE 89 05/07/2011 0400   BUN 13 05/07/2011 0400   CREATININE 0.88 05/07/2011 0400   CALCIUM 8.2* 05/07/2011 0400   GFRNONAA 66* 05/07/2011 0400   GFRAA 76* 05/07/2011 0400   COAG Lab Results  Component Value Date   INR 0.99 04/27/2011   INR 1.64* 12/30/2009   INR 1.72* 12/30/2009     Disposition:  Discharge to :Home Discharge Orders    Future Orders Please Complete By Expires   Resume previous diet      Driving Restrictions      Comments:   No driving for 2 weeks   Lifting restrictions      Comments:   No lifting for 4 weeks   Call MD for:  temperature >100.5      Call MD for:  redness, tenderness, or signs of infection (pain, swelling, bleeding, redness, odor or green/yellow discharge around incision site)      Call  MD for:  severe or increased pain, loss or decreased feeling  in affected limb(s)      May shower       Scheduling Instructions:   sunday   Increase activity slowly      Comments:   Walk with assistance use walker or cane as needed   No dressing needed      may wash over wound with mild soap and water      CAROTID Sugery: Call MD for difficulty swallowing or speaking; weakness in arms or legs that is a new symtom; severe headache.  If you have increased swelling in the neck and/or  are having difficulty breathing, CALL 911         Brayden, Betters  Home Medication Instructions [id number]   Printed on:05/08/11  0746  Medication Information                    metoprolol tartrate (LOPRESSOR) 25 MG tablet Take 25 mg by mouth daily.            lansoprazole (PREVACID) 15 MG capsule Take 15 mg by mouth daily.           Flaxseed, Linseed, 1000 MG CAPS Take 1 capsule by mouth daily.            ascorbic acid (VITAMIN C) 500 MG tablet Take 500 mg by mouth daily.           Omega-3 Fatty Acids (FISH OIL) 1000 MG CAPS Take 1,000 mg by mouth daily.            Quercetin 50 MG TABS Take 1 tablet by mouth daily.            Multiple Vitamin (MULITIVITAMIN WITH MINERALS) TABS Take 1 tablet by mouth daily.           rosuvastatin (CRESTOR) 10 MG tablet Take 10 mg by mouth 3 (three) times a week. On Monday, Wednesday, and Friday           aspirin EC 81 MG tablet Take 81 mg by mouth every other day.           co-enzyme Q-10 50 MG capsule Take 100 mg by mouth daily.           Magnesium 250 MG TABS Take 1 tablet by mouth daily.           vitamin B-12 (CYANOCOBALAMIN) 500 MCG tablet Take 500 mcg by mouth daily.           cholecalciferol (VITAMIN D) 1000 UNITS tablet Take 1,000 Units by mouth 3 (three) times a week.           calcium carbonate (OS-CAL - DOSED IN MG OF ELEMENTAL CALCIUM) 1250 MG tablet Take 1 tablet by mouth daily.           traMADol (ULTRAM) 50 MG tablet Take 1 tablet (50 mg total) by mouth every 8 (eight) hours as needed.            Verbal and written Discharge instructions given to the patient. Wound care per Discharge AVS   Signed: Marlowe Shores 05/08/2011, 7:46 AM

## 2011-05-08 NOTE — Progress Notes (Signed)
VASCULAR AND VEIN SURGERY POST - OP CEA PROGRESS NOTE  Date of Surgery: 05/06/2011 Surgeon: Surgeon(s): Nada Libman, MD 2 Days Post-Op right Carotid Endarterectomy .  HPI: Jennifer [name] is a 71 y.o. female who is 2 Days Post-Op right Carotid Endarterectomy . Patient is doing well. Patient denies headache; Patient denies difficulty swallowing; denies weakness in upper or lower extremities; Pt. denies other symptoms of stroke or TIA.  IMAGING: No results found.  Significant Diagnostic Studies: CBC Lab Results  Component Value Date   WBC 5.9 05/07/2011   HGB 11.0* 05/07/2011   HCT 33.1* 05/07/2011   MCV 94.6 05/07/2011   PLT 179 05/07/2011    BMET    Component Value Date/Time   NA 141 05/07/2011 0400   K 3.9 05/07/2011 0400   CL 112 05/07/2011 0400   CO2 21 05/07/2011 0400   GLUCOSE 89 05/07/2011 0400   BUN 13 05/07/2011 0400   CREATININE 0.88 05/07/2011 0400   CALCIUM 8.2* 05/07/2011 0400   GFRNONAA 66* 05/07/2011 0400   GFRAA 76* 05/07/2011 0400    COAG Lab Results  Component Value Date   INR 0.99 04/27/2011   INR 1.64* 12/30/2009   INR 1.72* 12/30/2009   No results found for this basename: PTT      Intake/Output Summary (Last 24 hours) at 05/08/11 0742 Last data filed at 05/08/11 0700  Gross per 24 hour  Intake 2638.33 ml  Output   4000 ml  Net -1361.67 ml    Physical Exam:  BP Readings from Last 3 Encounters:  05/08/11 151/89  05/08/11 151/89  04/27/11 165/87   Temp Readings from Last 3 Encounters:  05/08/11 98.4 F (36.9 C) Oral  05/08/11 98.4 F (36.9 C) Oral  04/27/11 97.2 F (36.2 C)    SpO2 Readings from Last 3 Encounters:  05/08/11 97%  05/08/11 97%  04/27/11 95%   Pulse Readings from Last 3 Encounters:  05/08/11 78  05/08/11 78  04/27/11 71    Pt is A&O x 3 Gait is normal Speech is fluent right Neck Wound is healing well with mod swelling - unchanged Patient with Negative tongue deviation and Positive facial droop Pt has good and  equal strength in all extremities Some numbness chin  Assessment: Jennifer [name] is a 71 y.o. female is S/P Right Carotid endarterectomy Pt is voiding, ambulating and taking po well  Right mandibular nerve irritation sec retraction causing right chin numbness and facial droop - will improve with time   Plan: Discharge to: Home Follow-up in 2 weeks  Delbert Darley J [phone] 05/08/2011 7:42 AM

## 2011-05-09 NOTE — Discharge Summary (Signed)
Agree with above Much better today, n/v resolved Marginal mandibular neuropraxia improved F/u 2 weeks  Wells Wafaa Deemer

## 2011-05-28 ENCOUNTER — Encounter: Payer: Self-pay | Admitting: Surgery

## 2011-05-31 ENCOUNTER — Ambulatory Visit (INDEPENDENT_AMBULATORY_CARE_PROVIDER_SITE_OTHER): Payer: Medicare Other | Admitting: Surgery

## 2011-05-31 ENCOUNTER — Encounter: Payer: Self-pay | Admitting: Surgery

## 2011-05-31 VITALS — BP 160/98 | HR 62 | Temp 98.0°F | Ht 63.0 in | Wt 156.0 lb

## 2011-05-31 DIAGNOSIS — Z48812 Encounter for surgical aftercare following surgery on the circulatory system: Secondary | ICD-10-CM

## 2011-05-31 DIAGNOSIS — I6529 Occlusion and stenosis of unspecified carotid artery: Secondary | ICD-10-CM | POA: Insufficient documentation

## 2011-05-31 NOTE — Progress Notes (Signed)
The patient is here today for her first postoperative visit. She is status post right carotid endarterectomy for asymptomatic high-grade stenosis. Her operation was on 05/06/2011. Intraoperative findings included an 80% stenosis without evidence of thrombus. Her postoperative course was uncomplicated. She is back today with no complaints with the exception of some drooping from the corner of her mouth on the right side. She denies neurologic symptoms.  On physical examination her incision is well-healed. Her tongue is midline. She has no neurologic deficits with the exception of a right marginal mandibular neurapraxia.  The patient is making good progress. I have reassured her that the drooping on her mouth will resolve with time. I will schedule her to come back in 6 months for followup with a carotid duplex. I will need to keep an eye on her left side the stenosis which has been in the 60-70% category.  She is status post aortic arch reconstruction in conjunction with Dr. Lavinia Sharps who also performed CABG x4. She is doing well from this perspective

## 2011-06-01 NOTE — Progress Notes (Signed)
Addended by: Sharee Pimple on: 06/01/2011 12:59 PM   Modules accepted: Orders

## 2011-11-26 ENCOUNTER — Encounter: Payer: Self-pay | Admitting: Neurosurgery

## 2011-11-29 ENCOUNTER — Other Ambulatory Visit (INDEPENDENT_AMBULATORY_CARE_PROVIDER_SITE_OTHER): Payer: Medicare Other | Admitting: *Deleted

## 2011-11-29 ENCOUNTER — Ambulatory Visit (INDEPENDENT_AMBULATORY_CARE_PROVIDER_SITE_OTHER): Payer: Medicare Other | Admitting: Neurosurgery

## 2011-11-29 ENCOUNTER — Encounter: Payer: Self-pay | Admitting: Neurosurgery

## 2011-11-29 VITALS — BP 197/97 | HR 70 | Resp 16 | Ht 63.0 in | Wt 154.0 lb

## 2011-11-29 DIAGNOSIS — Z48812 Encounter for surgical aftercare following surgery on the circulatory system: Secondary | ICD-10-CM

## 2011-11-29 DIAGNOSIS — I6529 Occlusion and stenosis of unspecified carotid artery: Secondary | ICD-10-CM

## 2011-11-29 NOTE — Addendum Note (Signed)
Addended by: Sharee Pimple on: 11/29/2011 02:59 PM   Modules accepted: Orders

## 2011-11-29 NOTE — Progress Notes (Signed)
[facility]  Carotid Office Note  CC: Carotid surveillance Referring Physician: Brabham  History of Present Illness: 71 year old female patient of Dr. Estanislado Spire that is status post ascending aorta innominate left CCA and left subclavian artery bypass graft in November 2011 with a right carotid endarterectomy in March 2013. The patient denies any signs or symptoms of CVA, TIA, amaurosis fugax or any neural deficit.  Past Medical History  Diagnosis Date  . Joint pain   . Eyesight diminished Change in eyesight  . Shortness of breath on exertion   . Hypertension   . Hypercholesterolemia   . Coronary artery disease Oct. 2011  . Carotid artery occlusion   . Anemia   . Blood transfusion     1965  . GERD (gastroesophageal reflux disease)   . No pertinent past medical history     HX KIDNEY STONES  . Headache   . Arthritis     ROS: [x]  Positive   [ ]  Denies    General: [ ]  Weight loss, [ ]  Fever, [ ]  chills Neurologic: [ ]  Dizziness, [ ]  Blackouts, [ ]  Seizure [ ]  Stroke, [ ]  "Mini stroke", [ ]  Slurred speech, [ ]  Temporary blindness; [ ]  weakness in arms or legs, [ ]  Hoarseness Cardiac: [ ]  Chest pain/pressure, [ ]  Shortness of breath at rest [ ]  Shortness of breath with exertion, [ ]  Atrial fibrillation or irregular heartbeat Vascular: [ ]  Pain in legs with walking, [ ]  Pain in legs at rest, [ ]  Pain in legs at night,  [ ]  Non-healing ulcer, [ ]  Blood clot in vein/DVT,   Pulmonary: [ ]  Home oxygen, [ ]  Productive cough, [ ]  Coughing up blood, [ ]  Asthma,  [ ]  Wheezing Musculoskeletal:  [ ]  Arthritis, [ ]  Low back pain, [ ]  Joint pain Hematologic: [ ]  Easy Bruising, [ ]  Anemia; [ ]  Hepatitis Gastrointestinal: [ ]  Blood in stool, [ ]  Gastroesophageal Reflux/heartburn, [ ]  Trouble swallowing Urinary: [ ]  chronic Kidney disease, [ ]  on HD - [ ]  MWF or [ ]  TTHS, [ ]  Burning with urination, [ ]  Difficulty urinating Skin: [ ]  Rashes, [ ]  Wounds Psychological: [  ] Anxiety, [ ]  Depression   Social History History  Substance Use Topics  . Smoking status: Former Smoker    Quit date: 12/16/2009  . Smokeless tobacco: Not on file  . Alcohol Use: No     VERY LITTLE    Family History Family History  Problem Relation Age of Onset  . Diabetes Mother   . Hyperlipidemia Mother   . Hypertension Mother   . Diabetes Sister   . Hyperlipidemia Sister   . Hypertension Sister     AAA  . Cancer Brother     Prostate  . Heart disease Brother   . Hypertension Brother   . Cancer Brother     Prostate     Allergies  Allergen Reactions  . Statins Anaphylaxis  . Penicillins Hives and Rash    Current Outpatient Prescriptions  Medication Sig Dispense Refill  . ascorbic acid (VITAMIN C) 500 MG tablet Take 500 mg by mouth daily.      Marland Kitchen aspirin EC 81 MG tablet Take 81 mg by mouth every other day.      . calcium carbonate (OS-CAL - DOSED IN MG OF ELEMENTAL CALCIUM) 1250 MG tablet Take 1,600 tablets by mouth daily.       . cholecalciferol (VITAMIN D) 1000 UNITS tablet Take 1,000  Units by mouth 3 (three) times a week.      . co-enzyme Q-10 50 MG capsule Take 100 mg by mouth daily.      . Flaxseed, Linseed, 1000 MG CAPS Take 1 capsule by mouth daily.       . lansoprazole (PREVACID) 15 MG capsule Take 15 mg by mouth daily.      . Magnesium 250 MG TABS Take 1 tablet by mouth daily.      . metoprolol tartrate (LOPRESSOR) 25 MG tablet Take 25 mg by mouth daily.       . Multiple Vitamin (MULITIVITAMIN WITH MINERALS) TABS Take 1 tablet by mouth daily.      . Omega-3 Fatty Acids (FISH OIL) 1000 MG CAPS Take 1,000 mg by mouth daily.       . rosuvastatin (CRESTOR) 10 MG tablet Take 10 mg by mouth 3 (three) times a week. On Monday, Wednesday, and Friday       . vitamin B-12 (CYANOCOBALAMIN) 500 MCG tablet Take 500 mcg by mouth daily.      . metoprolol succinate (TOPROL-XL) 25 MG 24 hr tablet daily.      . Quercetin 50 MG TABS Take 1 tablet by mouth daily.       .  traMADol (ULTRAM) 50 MG tablet 25 mg.      . DISCONTD: Calcium Carb-Cholecalciferol (CALTRATE 600+D) 600-800 MG-UNIT TABS Take by mouth.      . DISCONTD: calcium citrate-vitamin D (CITRACAL+D) 315-200 MG-UNIT per tablet Take 1 tablet by mouth 2 (two) times daily.      Marland Kitchen DISCONTD: furosemide (LASIX) 40 MG tablet Take 40 mg by mouth daily.        Marland Kitchen DISCONTD: potassium chloride 20 MEQ/15ML (10%) solution Take by mouth daily.          Physical Examination  Filed Vitals:   11/29/11 1419  BP: 197/97  Pulse: 70  Resp:     Body mass index is 27.28 kg/(m^2).  General:  WDWN in NAD Gait: Normal HEENT: WNL Eyes: Pupils equal Pulmonary: normal non-labored breathing , without Rales, rhonchi,  wheezing Cardiac: RRR, without  Murmurs, rubs or gallops; Abdomen: soft, NT, no masses Skin: no rashes, ulcers noted  Vascular Exam Pulses: 3+ radial pulses bilaterally Carotid bruits: Carotid pulses to auscultation I do not hear a bruit Extremities without ischemic changes, no Gangrene , no cellulitis; no open wounds;  Musculoskeletal: no muscle wasting or atrophy   Neurologic: A&O X 3; Appropriate Affect ; SENSATION: normal; MOTOR FUNCTION:  moving all extremities equally. Speech is fluent/normal  Non-Invasive Vascular Imaging CAROTID DUPLEX 11/29/2011  Right ICA 0 - 19% stenosis Left ICA 60 - 79 % stenosis   ASSESSMENT/PLAN: Asymptomatic patient with a patent right ICA and advanced left ICA stenosis. Patient knows the signs and symptoms of CVA and knows to report to the nearest emergency department should that occur. The patient's questions were encouraged and answered, she will followup here in 6 months with repeat carotid duplex, she is in agreement with this plan.  Lauree Chandler ANP   Clinic MD: Myra Gianotti

## 2012-11-13 ENCOUNTER — Ambulatory Visit (INDEPENDENT_AMBULATORY_CARE_PROVIDER_SITE_OTHER): Payer: Medicare Other | Admitting: Cardiovascular Disease

## 2012-11-13 ENCOUNTER — Encounter: Payer: Self-pay | Admitting: Cardiovascular Disease

## 2012-11-13 VITALS — BP 190/100 | HR 72 | Ht 63.0 in | Wt 152.0 lb

## 2012-11-13 DIAGNOSIS — I10 Essential (primary) hypertension: Secondary | ICD-10-CM | POA: Insufficient documentation

## 2012-11-13 DIAGNOSIS — I6529 Occlusion and stenosis of unspecified carotid artery: Secondary | ICD-10-CM

## 2012-11-13 DIAGNOSIS — I2510 Atherosclerotic heart disease of native coronary artery without angina pectoris: Secondary | ICD-10-CM

## 2012-11-13 DIAGNOSIS — E785 Hyperlipidemia, unspecified: Secondary | ICD-10-CM

## 2012-11-13 DIAGNOSIS — Z951 Presence of aortocoronary bypass graft: Secondary | ICD-10-CM | POA: Insufficient documentation

## 2012-11-13 DIAGNOSIS — Z87891 Personal history of nicotine dependence: Secondary | ICD-10-CM

## 2012-11-13 NOTE — Assessment & Plan Note (Signed)
Status post coronary artery bypass grafting x4 by Dr. Rexanne Mano 12/30/09. She denies chest pain or shortness of breath.

## 2012-11-13 NOTE — Assessment & Plan Note (Signed)
Status post right carotid endarterectomy performed by Dr. Durene Cal.  March of 2013. He follows this noninvasively as an outpatient. It sounds as she's also had a left carotid to subclavian bypass

## 2012-11-13 NOTE — Assessment & Plan Note (Signed)
Under good control at home. She says she measures her blood pressure daily which runs in the 130/70 range.

## 2012-11-13 NOTE — Patient Instructions (Addendum)
Your physician wants you to follow-up in: 6 months with an extender and 1 year with Dr Berry. You will receive a reminder letter in the mail two months in advance. If you don't receive a letter, please call our office to schedule the follow-up appointment.  

## 2012-11-13 NOTE — Assessment & Plan Note (Signed)
On statin therapy (rosuvastatin every other day). Her recent lipid profile performed 06/20/12 revealed a total cholesterol of 218, LDL 144 HDL of 48. She is intolerant to higher doses of statin drugs.

## 2012-11-13 NOTE — Progress Notes (Signed)
.   11/13/2012 Jennifer [name]   [birth year]  [id number]  Primary Physician Ailene Ravel, MD Primary Cardiologist: Runell Gess MD Roseanne Reno   HPI:  Jennifer [name] is a 72 year old mildly overweight married Caucasian female mother of 2 children, grandmother and one grandchild was formally a patient of Dr. Algie Coffer. She is transitioning her care to me because of insurance-related issues. 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 Risk factors include discontinue tobacco use having quit in November of 2011 and smoked 25 pack years. She has treated hypertension and dyslipidemia. She has a history of left carotid to subclavian artery bypass grafting by Dr. Myra Gianotti as post coronary artery bypass grafting x411/13/11 by Dr. Rexanne Mano. She's also had a lack of right carotid endarterectomy performed by Dr. Myra Gianotti March of last year. He follows her not as an outpatient noninvasively. She denies chest pain or shortness of breath, or claudication.   Current Outpatient Prescriptions  Medication Sig Dispense Refill  . amLODipine (NORVASC) 2.5 MG tablet Take 2.5 mg by mouth daily.      Marland Kitchen ascorbic acid (VITAMIN C) 500 MG tablet Take 500 mg by mouth daily.      Marland Kitchen aspirin EC 81 MG tablet Take 81 mg by mouth every other day.      . calcium carbonate (OS-CAL - DOSED IN MG OF ELEMENTAL CALCIUM) 1250 MG tablet Take 1,600 tablets by mouth daily.       . cholecalciferol (VITAMIN D) 1000 UNITS tablet Take 1,000 Units by mouth 3 (three) times a week.      . co-enzyme Q-10 50 MG capsule Take 100 mg by mouth daily.      . Flaxseed, Linseed, 1000 MG CAPS Take 1 capsule by mouth daily.       . Garlic (GARLIQUE PO) Take 1 tablet by mouth daily.      . lansoprazole (PREVACID) 15 MG capsule Take 15 mg by mouth daily.      . Magnesium 250 MG TABS Take 1 tablet by mouth daily.      . metoprolol succinate (TOPROL-XL) 25 MG 24 hr tablet Take 25 mg by mouth. 1/2 tablet daily      . Multiple Vitamin  (MULITIVITAMIN WITH MINERALS) TABS Take 1 tablet by mouth daily.      . Omega-3 Fatty Acids (FISH OIL) 1000 MG CAPS Take 1,000 mg by mouth daily.       . Quercetin 50 MG TABS Take 1 tablet by mouth daily.       . rosuvastatin (CRESTOR) 10 MG tablet Take 10 mg by mouth 3 (three) times a week. On Monday, Wednesday, and Friday       . vitamin B-12 (CYANOCOBALAMIN) 500 MCG tablet Take 500 mcg by mouth daily.      . [DISCONTINUED] Calcium Carb-Cholecalciferol (CALTRATE 600+D) 600-800 MG-UNIT TABS Take by mouth.      . [DISCONTINUED] calcium citrate-vitamin D (CITRACAL+D) 315-200 MG-UNIT per tablet Take 1 tablet by mouth 2 (two) times daily.      . [DISCONTINUED] furosemide (LASIX) 40 MG tablet Take 40 mg by mouth daily.        . [DISCONTINUED] potassium chloride 20 MEQ/15ML (10%) solution Take by mouth daily.         No current facility-administered medications for this visit.    Allergies  Allergen Reactions  . Statins Anaphylaxis  . Penicillins Hives and Rash    History   Social History  . Marital Status: Married  Spouse Name: N/A    Number of Children: N/A  . Years of Education: N/A   Occupational History  . Not on file.   Social History Main Topics  . Smoking status: Former Smoker    Quit date: 12/16/2009  . Smokeless tobacco: Not on file  . Alcohol Use: No     Comment: VERY LITTLE  . Drug Use: No  . Sexual Activity:    Other Topics Concern  . Not on file   Social History Narrative  . No narrative on file     Review of Systems: General: negative for chills, fever, night sweats or weight changes.  Cardiovascular: negative for chest pain, dyspnea on exertion, edema, orthopnea, palpitations, paroxysmal nocturnal dyspnea or shortness of breath Dermatological: negative for rash Respiratory: negative for cough or wheezing Urologic: negative for hematuria Abdominal: negative for nausea, vomiting, diarrhea, bright red blood per rectum, melena, or hematemesis Neurologic:  negative for visual changes, syncope, or dizziness All other systems reviewed and are otherwise negative except as noted above.    Blood pressure 190/100, pulse 72, height 5\' 3"  (1.6 m), weight 152 lb (68.947 kg).  General appearance: alert and no distress Neck: no adenopathy, no JVD, supple, symmetrical, trachea midline, thyroid not enlarged, symmetric, no tenderness/mass/nodules and left carotid artery Lungs: clear to auscultation bilaterally Heart: regular rate and rhythm, S1, S2 normal, no murmur, click, rub or gallop Abdomen: soft, non-tender; bowel sounds normal; no masses,  no organomegaly Extremities: extremities normal, atraumatic, no cyanosis or edema Pulses: 2+ and symmetric  EKG normal sinus rhythm at 72 without ST or T wave changes  ASSESSMENT AND PLAN:   Coronary artery disease Status post coronary artery bypass grafting x4 by Dr. Rexanne Mano 12/30/09. She denies chest pain or shortness of breath.  Occlusion and stenosis of carotid artery without mention of cerebral infarction Status post right carotid endarterectomy performed by Dr. Durene Cal.  March of 2013. He follows this noninvasively as an outpatient. It sounds as she's also had a left carotid to subclavian bypass  Essential hypertension Under good control at home. She says she measures her blood pressure daily which runs in the 130/70 range.  Hyperlipidemia On statin therapy (rosuvastatin every other day). Her recent lipid profile performed 06/20/12 revealed a total cholesterol of 218, LDL 144 HDL of 48. She is intolerant to higher doses of statin drugs.      Runell Gess MD FACP,FACC,FAHA, [facility] 11/13/2012 10:11 AM

## 2012-11-24 ENCOUNTER — Encounter: Payer: Self-pay | Admitting: Cardiovascular Disease

## 2012-12-01 ENCOUNTER — Encounter: Payer: Self-pay | Admitting: Family

## 2012-12-04 ENCOUNTER — Encounter: Payer: Self-pay | Admitting: Family

## 2012-12-04 ENCOUNTER — Ambulatory Visit (INDEPENDENT_AMBULATORY_CARE_PROVIDER_SITE_OTHER): Payer: Medicare Other | Admitting: Family

## 2012-12-04 ENCOUNTER — Ambulatory Visit (HOSPITAL_COMMUNITY)
Admission: RE | Admit: 2012-12-04 | Discharge: 2012-12-04 | Disposition: A | Discharge status: Home / Self Care | Payer: Medicare Other | Source: Ambulatory Visit | Attending: Family | Admitting: Family

## 2012-12-04 DIAGNOSIS — Z48812 Encounter for surgical aftercare following surgery on the circulatory system: Secondary | ICD-10-CM

## 2012-12-04 DIAGNOSIS — I6529 Occlusion and stenosis of unspecified carotid artery: Secondary | ICD-10-CM | POA: Insufficient documentation

## 2012-12-04 NOTE — Progress Notes (Signed)
Established Carotid Patient  History of Present Illness  Jennifer [name] is a 72 y.o. female patient of Dr. Estanislado Spire that is status post ascending aorta innominate left CCA and left subclavian artery bypass graft in November 2011 with a right carotid endarterectomy in March 2013 Patient reports headaches and right arm weakness with 20 mg Crestor qd which was stopped, mild myalgias on 10 mg Crestor 3x/week currently. Pt. states blood pressure usually runs 134/76 or less, states her blood pressure goes up in Dr. offices. Is walking twice/week, 2 miles. Cares for brother with dementia. Had 4 vessel CABG 2011, denies MI.  Patient has Negative history of TIA or stroke symptom.  The patient denies amaurosis fugax or monocular blindness.  The patient  denies facial drooping.  Pt. denies hemiplegia.  The patient denies receptive or expressive aphasia.  Pt. denies extremity weakness.   Patient denies New Medical or Surgical History.  Pt Diabetic: No Pt smoker: former smoker, quit 3 years ago  Pt meds include: Statin : Yes Betablocker: Yes ASA: Yes Other anticoagulants/antiplatelets: no   Past Medical History  Diagnosis Date  . Joint pain   . Eyesight diminished Change in eyesight  . Shortness of breath on exertion   . Hypertension   . Hypercholesterolemia   . Coronary artery disease Oct. 2011  . Carotid artery occlusion   . Anemia   . Blood transfusion     1965  . GERD (gastroesophageal reflux disease)   . No pertinent past medical history     HX KIDNEY STONES  . Headache(784.0)   . Arthritis     Social History History  Substance Use Topics  . Smoking status: Former Smoker    Quit date: 12/16/2009  . Smokeless tobacco: Never Used  . Alcohol Use: No     Comment: VERY LITTLE    Family History Family History  Problem Relation Age of Onset  . Diabetes Mother   . Hyperlipidemia Mother   . Hypertension Mother   . Diabetes Sister   . Hyperlipidemia Sister   . Hypertension  Sister     AAA  . Cancer Brother     Prostate  . Heart disease Brother   . Hypertension Brother   . Cancer Brother     Prostate     Surgical History Past Surgical History  Procedure Laterality Date  . Carotid artery - subclavian artery bypass graft    . Coronary artery bypass graft      12/2009    . Cardiac catheterization      2011  [facility]  . No past surgeries    . Cardiovascular stress test      2012 [facility] ALSO ECHO    . Eye surgery      LASER BIL X2 FOR GLAUCOMA  . Abdominal hysterectomy    . Cesarean section      X2   . Breast biopsy      BILATERAL  BENIGN   . Endarterectomy  05/06/2011    Procedure: ENDARTERECTOMY CAROTID;  Surgeon: Nada Libman, MD;  Location: [facility] OR;  Service: Vascular;  Laterality: Right;  right carotid artery endarterctomy with vascu guard patch angioplasty   . Carotid endarterectomy  05/06/11    Right CEA  . Pr vein bypass graft,aorto-fem-pop      Allergies  Allergen Reactions  . Statins Anaphylaxis  . Penicillins Hives and Rash    Current Outpatient Prescriptions  Medication Sig Dispense Refill  . ALPHA  LIPOIC ACID PO Take 250 mg by mouth daily.      Marland Kitchen amLODipine (NORVASC) 2.5 MG tablet Take 2.5 mg by mouth daily.      Marland Kitchen ascorbic acid (VITAMIN C) 500 MG tablet Take 500 mg by mouth daily.      Marland Kitchen aspirin EC 81 MG tablet Take 81 mg by mouth every other day.      . cholecalciferol (VITAMIN D) 1000 UNITS tablet Take 1,000 Units by mouth 3 (three) times a week.      . co-enzyme Q-10 50 MG capsule Take 100 mg by mouth daily.      . Flaxseed, Linseed, 1000 MG CAPS Take 1 capsule by mouth daily.       . Garlic (GARLIQUE PO) Take 1 tablet by mouth daily.      . lansoprazole (PREVACID) 15 MG capsule Take 15 mg by mouth daily.      . Magnesium 250 MG TABS Take 1 tablet by mouth daily.      . metoprolol succinate (TOPROL-XL) 25 MG 24 hr tablet Take 25 mg by mouth. 1/2 tablet daily      . Multiple Vitamin (MULITIVITAMIN WITH MINERALS)  TABS Take 1 tablet by mouth daily.      . Omega-3 Fatty Acids (FISH OIL) 1000 MG CAPS Take 1,000 mg by mouth daily. Mega Kr[employer] Fish oil      . rosuvastatin (CRESTOR) 10 MG tablet Take 10 mg by mouth 3 (three) times a week. On Monday, Wednesday, and Friday       . vitamin B-12 (CYANOCOBALAMIN) 500 MCG tablet Take 500 mcg by mouth daily.      . Vitamin Mixture (ESTER-C) 500-60 MG TABS Take 500 mg by mouth daily.      . calcium carbonate (OS-CAL - DOSED IN MG OF ELEMENTAL CALCIUM) 1250 MG tablet Take 1,600 tablets by mouth daily.       . Quercetin 50 MG TABS Take 1 tablet by mouth daily.       . [DISCONTINUED] Calcium Carb-Cholecalciferol (CALTRATE 600+D) 600-800 MG-UNIT TABS Take by mouth.      . [DISCONTINUED] calcium citrate-vitamin D (CITRACAL+D) 315-200 MG-UNIT per tablet Take 1 tablet by mouth 2 (two) times daily.      . [DISCONTINUED] furosemide (LASIX) 40 MG tablet Take 40 mg by mouth daily.        . [DISCONTINUED] potassium chloride 20 MEQ/15ML (10%) solution Take by mouth daily.         No current facility-administered medications for this visit.    Review of Systems : [x]  Positive   [ ]  Denies  General:[ ]  Weight loss,  [ ]  Weight gain, [ ]  Loss of appetite, [ ]  Fever, [ ]  chills  Neurologic: [ ]  Dizziness, [ ]  Blackouts, [ ]  Headaches, [ ]  Seizure [ ]  Stroke, [ ]  "Mini stroke", [ ]  Slurred speech, [ ]  Temporary blindness;  [ ] weakness,  Ear/Nose/Throat: [ ]  Change in hearing, [ ]  Nose bleeds, [ ]  Hoarseness  Vascular:[ ]  Pain in legs with walking, [ ]  Pain in feet while lying flat , [ ]   Non-healing ulcer, [ ]  Blood clot in vein,    Pulmonary: [ ]  Home oxygen, [ ]   Productive cough, [ ]  Bronchitis, [ ]  Coughing up blood,  [ ]  Asthma, [ ]  Wheezing  Musculoskeletal:  [ ]  Arthritis, [ ]  Joint pain, [ ]  low back pain  Cardiac: [ ]  Chest pain, [ ]  Shortness of breath when lying flat, [ ]   Shortness of breath with exertion, [ ]  Palpitations, [ ]  Heart murmur, [ ]   Atrial  fibrillation  Hematologic:[ ]  Easy Bruising, [ ]  Anemia; [ ]  Hepatitis  Psychiatric: [ ]   Depression, [ ]  Anxiety   Gastrointestinal: [ ]  Black stool, [ ]  Blood in stool, [ ]  Peptic ulcer disease,  [ ]  Gastroesophageal Reflux, [ ]  Trouble swallowing, [ ]  Diarrhea, [ ]  Constipation  Urinary: [ ]  chronic Kidney disease, [ ]  on HD, [ ]  Burning with urination, [ ]  Frequent urination, [ ]  Difficulty urinating;   Skin: [ ]  Rashes, [ ]  Wounds    Physical Examination  Filed Vitals:   12/04/12 1153  BP: 178/99  Pulse: 81  Resp:    Filed Weights   12/04/12 1150  Weight: 153 lb (69.4 kg)   Body mass index is 27.11 kg/(m^2).   General: WDWN female in NAD GAIT: normal Eyes: PERRLA Pulmonary:  CTAB, Negative  Rales, Negative rhonchi, & Negative wheezing.  Cardiac: regular Rhythm ,  Negative Murmurs.  VASCULAR EXAM Carotid Bruits Left Right   Negative Negative    Aorta is not palpable. Radial pulses are 2+ and equal                                                                                                                       LE Pulses LEFT RIGHT       FEMORAL   palpable   palpable        POPLITEAL  not palpable   not palpable       POSTERIOR TIBIAL  not palpable   not palpable        DORSALIS PEDIS      ANTERIOR TIBIAL  palpable   palpable     Gastrointestinal: soft, nontender, BS WNL, no r/g,  negative masses.  Musculoskeletal: Negative muscle atrophy/wasting. M/S 5/5 throughout, Extremities without ischemic changes.  Neurologic: A&O X 3; Appropriate Affect ; SENSATION ;normal;  Speech is normal CN 2-12 intact except slight left side facial droop with smile, Pain and light touch intact in extremities, Motor exam as listed above.   Non-Invasive Vascular Imaging CAROTID DUPLEX 12/04/2012   Right ICA: patent CEA site. Left ICA: 60 - 79 % stenosis. Biphasic Doppler waveforms in both subclavian arteries.  These findings are Unchanged from previous exam a  year ago.  Assessment: ENDORA TERESI is a 72 y.o. female who presents with asymptomatic patent right ICA (CEA site) and moderate/severe left ICA stenosis The  ICA stenosis is  Unchanged from previous exam. Brachial pressures are equivalent and hypertensive.  Plan: Follow-up in 6 months with Carotid Duplex scan.   I discussed in depth with the patient the nature of atherosclerosis, and emphasized the importance of maximal medical management including strict control of blood pressure, blood glucose, and lipid levels, obtaining regular exercise, and continued cessation of smoking.  The patient is aware that without maximal medical management the underlying atherosclerotic disease process will progress, limiting the benefit  of any interventions. The patient was given information about stroke prevention and what symptoms should prompt the patient to seek immediate medical care. Thank you for allowing [place] to participate in this patient's care.  Charisse March, RN, MSN, FNP-C [facility] [place] Office: [phone]  Clinic Physician: Myra Gianotti  12/04/2012 12:14 PM

## 2012-12-04 NOTE — Patient Instructions (Signed)
Stroke Prevention Some medical conditions and behaviors are associated with an increased chance of having a stroke. You may prevent a stroke by making healthy choices and managing medical conditions. Reduce your risk of having a stroke by:  Staying physically active. Get at least 30 minutes of activity on most or all days.  Not smoking. It may also be helpful to avoid exposure to secondhand smoke.  Limiting alcohol use. Moderate alcohol use is considered to be:  No more than 2 drinks per day for men.  No more than 1 drink per day for nonpregnant women.  Eating healthy foods.  Include 5 or more servings of fruits and vegetables a day.  Certain diets may be prescribed to address high blood pressure, high cholesterol, diabetes, or obesity.  Managing your cholesterol levels.  A low-saturated fat, low-trans fat, low-cholesterol, and high-fiber diet may control cholesterol levels.  Take any prescribed medicines to control cholesterol as directed by your caregiver.  Managing your diabetes.  A controlled-carbohydrate, controlled-sugar diet is recommended to manage diabetes.  Take any prescribed medicines to control diabetes as directed by your caregiver.  Controlling your high blood pressure (hypertension).  A low-salt (sodium), low-saturated fat, low-trans fat, and low-cholesterol diet is recommended to manage high blood pressure.  Take any prescribed medicines to control hypertension as directed by your caregiver.  Maintaining a healthy weight.  A reduced-calorie, low-sodium, low-saturated fat, low-trans fat, low-cholesterol diet is recommended to manage weight.  Stopping drug abuse.  Avoiding birth control pills.  Talk to your caregiver about the risks of taking birth control pills if you are over 35 years old, smoke, get migraines, or have ever had a blood clot.  Getting evaluated for sleep disorders (sleep apnea).  Talk to your caregiver about getting a sleep evaluation  if you snore a lot or have excessive sleepiness.  Taking medicines as directed by your caregiver.  For some people, aspirin or blood thinners (anticoagulants) are helpful in reducing the risk of forming abnormal blood clots that can lead to stroke. If you have the irregular heart rhythm of atrial fibrillation, you should be on a blood thinner unless there is a good reason you cannot take them.  Understand all your medicine instructions. SEEK IMMEDIATE MEDICAL CARE IF:   You have sudden weakness or numbness of the face, arm, or leg, especially on one side of the body.  You have sudden confusion.  You have trouble speaking (aphasia) or understanding.  You have sudden trouble seeing in one or both eyes.  You have sudden trouble walking.  You have dizziness.  You have a loss of balance or coordination.  You have a sudden, severe headache with no known cause.  You have new chest pain or an irregular heartbeat. Any of these symptoms may represent a serious problem that is an emergency. Do not wait to see if the symptoms will go away. Get medical help right away. Call your local emergency services (911 in [place] Do not drive yourself to the hospital. Document Released: 03/11/2004 Document Revised: 04/26/2011 Document Reviewed: 09/21/2010 [employer] Patient Information 2014 [employer].  

## 2012-12-05 ENCOUNTER — Ambulatory Visit: Payer: Medicare Other | Admitting: Neurosurgery

## 2012-12-05 ENCOUNTER — Other Ambulatory Visit: Payer: Medicare Other

## 2013-04-04 ENCOUNTER — Other Ambulatory Visit: Payer: Self-pay | Admitting: *Deleted

## 2013-04-04 MED ORDER — AMLODIPINE BESYLATE 2.5 MG PO TABS: 2.5000 mg | ORAL_TABLET | Freq: Every day | ORAL | Status: DC

## 2013-04-04 NOTE — Telephone Encounter (Signed)
Rx was sent to pharmacy electronically. 

## 2013-05-15 ENCOUNTER — Ambulatory Visit: Payer: Medicare Other | Admitting: Cardiology

## 2013-06-06 ENCOUNTER — Ambulatory Visit: Payer: Medicare Other | Admitting: Cardiology

## 2013-06-11 ENCOUNTER — Other Ambulatory Visit (HOSPITAL_COMMUNITY): Payer: Medicare Other

## 2013-06-11 ENCOUNTER — Ambulatory Visit: Payer: Medicare Other | Admitting: Family

## 2013-06-18 ENCOUNTER — Encounter: Payer: Self-pay | Admitting: Family

## 2013-06-19 ENCOUNTER — Ambulatory Visit (HOSPITAL_COMMUNITY)
Admission: RE | Admit: 2013-06-19 | Discharge: 2013-06-19 | Disposition: A | Discharge status: Home / Self Care | Payer: Medicare Other | Source: Ambulatory Visit | Attending: Family | Admitting: Family

## 2013-06-19 ENCOUNTER — Ambulatory Visit (INDEPENDENT_AMBULATORY_CARE_PROVIDER_SITE_OTHER): Payer: Medicare Other | Admitting: Family

## 2013-06-19 ENCOUNTER — Encounter: Payer: Self-pay | Admitting: Family

## 2013-06-19 VITALS — BP 156/88 | HR 62 | Resp 16 | Ht 63.0 in | Wt 156.0 lb

## 2013-06-19 DIAGNOSIS — I6529 Occlusion and stenosis of unspecified carotid artery: Secondary | ICD-10-CM

## 2013-06-19 DIAGNOSIS — Z48812 Encounter for surgical aftercare following surgery on the circulatory system: Secondary | ICD-10-CM

## 2013-06-19 NOTE — Patient Instructions (Signed)

## 2013-06-19 NOTE — Progress Notes (Signed)
Established Carotid Patient   History of Present Illness  Jennifer [name] is a 73 y.o. female patient of Dr. 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 She returns today for follow up. She denies tingling, numbness, weakness, or pain in either hand/arm, attributes transient dizziness to amlodipine, about 30 minutes after taking it, denies any other dizziness issues. Patient reports headaches and right arm weakness with 20 mg Crestor qd which was stopped, mild myalgias on 10 mg Crestor 3x/week currently.  Pt. states blood pressure usually runs 134/76 or less, states her blood pressure goes up in Dr. offices.  Is walking twice/week, 2 miles.  Cares for brother with dementia.  Had 4 vessel CABG 2011, denies MI.  Patient has Negative history of TIA or stroke symptom. The patient denies amaurosis fugax or monocular blindness. The patient denies facial drooping.  Pt. denies hemiplegia. The patient denies receptive or expressive aphasia. Pt. denies extremity weakness.  Pt denies claudication symptoms in legs with walking. Patient denies New Medical or Surgical History.   Pt Diabetic: No  Pt smoker: former smoker, quit in 2011 Pt meds include:  Statin : Yes, cannot tolerate high dose statins, had muscle spasms, is taking 10 mg Crestor 3x/week Betablocker: Yes  ASA: Yes  Other anticoagulants/antiplatelets: no   Past Medical History  Diagnosis Date  . Joint pain   . Eyesight diminished Change in eyesight  . Shortness of breath on exertion   . Hypertension   . Hypercholesterolemia   . Coronary artery disease Oct. 2011  . Carotid artery occlusion   . Anemia   . Blood transfusion     1965  . GERD (gastroesophageal reflux disease)   . No pertinent past medical history     HX KIDNEY STONES  . Headache(784.0)   . Arthritis     Social History History  Substance  Use Topics  . Smoking status: Former Smoker    Quit date: 12/16/2009  . Smokeless tobacco: Never Used  . Alcohol Use: No     Comment: VERY LITTLE    Family History Family History  Problem Relation Age of Onset  . Diabetes Mother   . Hyperlipidemia Mother   . Hypertension Mother   . Diabetes Sister   . Hyperlipidemia Sister   . Hypertension Sister     AAA  . Cancer Brother     Prostate  . Heart disease Brother   . Hypertension Brother   . Cancer Brother     Prostate     Surgical History Past Surgical History  Procedure Laterality Date  . Carotid artery - subclavian artery bypass graft    . Coronary artery bypass graft      12/2009    . Cardiac catheterization      2011  [facility]  . No past surgeries    . Cardiovascular stress test      2012 DR [facility] ALSO ECHO    . Eye surgery      LASER BIL X2 FOR GLAUCOMA  . Abdominal hysterectomy    . Cesarean section      X2   . Breast biopsy      BILATERAL  BENIGN   . Endarterectomy  05/06/2011    Procedure: ENDARTERECTOMY CAROTID;  Surgeon: Nada Libman, MD;  Location: [facility] OR;  Service: Vascular;  Laterality: Right;  right carotid artery endarterctomy  with vascu guard patch angioplasty   . Carotid endarterectomy  05/06/11    Right CEA  . Pr vein bypass graft,aorto-fem-pop      Allergies  Allergen Reactions  . Statins Anaphylaxis    High dose   . Penicillins Hives and Rash    Current Outpatient Prescriptions  Medication Sig Dispense Refill  . ALPHA LIPOIC ACID PO Take 250 mg by mouth daily.      Marland Kitchen. amLODipine (NORVASC) 2.5 MG tablet Take 1 tablet (2.5 mg total) by mouth daily.  90 tablet  2  . ascorbic acid (VITAMIN C) 500 MG tablet Take 500 mg by mouth daily.      Marland Kitchen. aspirin EC 81 MG tablet Take 81 mg by mouth every other day.      . calcium carbonate (OS-CAL - DOSED IN MG OF ELEMENTAL CALCIUM) 1250 MG tablet Take 1,600 tablets by mouth daily.       . cholecalciferol (VITAMIN D) 1000 UNITS tablet Take 1,000  Units by mouth 3 (three) times a week.      . co-enzyme Q-10 50 MG capsule Take 100 mg by mouth daily.      . Flaxseed, Linseed, 1000 MG CAPS Take 1 capsule by mouth daily.       . Garlic (GARLIQUE PO) Take 1 tablet by mouth daily.      . lansoprazole (PREVACID) 15 MG capsule Take 15 mg by mouth daily.      . Magnesium 250 MG TABS Take 1 tablet by mouth daily.      . metoprolol succinate (TOPROL-XL) 25 MG 24 hr tablet Take 25 mg by mouth. 1/2 tablet daily      . Multiple Vitamin (MULITIVITAMIN WITH MINERALS) TABS Take 1 tablet by mouth daily.      . Omega-3 Fatty Acids (FISH OIL) 1000 MG CAPS Take 1,000 mg by mouth daily. Mega Krill Fish oil      . Quercetin 50 MG TABS Take 1 tablet by mouth daily.       . rosuvastatin (CRESTOR) 10 MG tablet Take 10 mg by mouth 3 (three) times a week. On Monday, Wednesday, and Friday       . vitamin B-12 (CYANOCOBALAMIN) 500 MCG tablet Take 500 mcg by mouth daily.      . Vitamin Mixture (ESTER-C) 500-60 MG TABS Take 500 mg by mouth daily.      . [DISCONTINUED] Calcium Carb-Cholecalciferol (CALTRATE 600+D) 600-800 MG-UNIT TABS Take by mouth.      . [DISCONTINUED] calcium citrate-vitamin D (CITRACAL+D) 315-200 MG-UNIT per tablet Take 1 tablet by mouth 2 (two) times daily.      . [DISCONTINUED] furosemide (LASIX) 40 MG tablet Take 40 mg by mouth daily.        . [DISCONTINUED] potassium chloride 20 MEQ/15ML (10%) solution Take by mouth daily.         No current facility-administered medications for this visit.    Review of Systems : See HPI for pertinent positives and negatives.  Physical Examination   Filed Vitals:   06/19/13 1039 06/19/13 1043  BP: 159/87 156/88  Pulse: 63 62  Resp:  16  Height:  5\' 3"  (1.6 m)  Weight:  156 lb (70.761 kg)  SpO2:  100%  Body mass index is 27.64 kg/(m^2).   General: WDWN female in NAD  GAIT: normal  Eyes: PERRLA  Pulmonary: CTAB, Negative Rales, Negative rhonchi, or Negative wheezing.  Cardiac: regular Rhythm ,  no Murmur detected VASCULAR EXAM  Carotid Bruits  Left  Right    Negative  Negative     Radial pulses are 2+ and equal  LE Pulses  LEFT  RIGHT   POPLITEAL  not palpable  not palpable   POSTERIOR TIBIAL  not palpable  not palpable   DORSALIS PEDIS  ANTERIOR TIBIAL  palpable  palpable    Gastrointestinal: soft, nontender, BS WNL, no r/g, negative masses.  Musculoskeletal: Negative muscle atrophy/wasting. M/S 5/5 throughout, Extremities without ischemic changes.  Neurologic: A&O X 3; Appropriate Affect ; SENSATION ;normal;  Speech is normal  CN 2-12 intact except slight left side facial droop with smile, Pain and light touch intact in extremities, Motor exam as listed above.   Non-Invasive Vascular Imaging CAROTID DUPLEX 06/19/2013   CEREBROVASCULAR DUPLEX EVALUATION    INDICATION: Follow-up carotid disease     PREVIOUS INTERVENTION(S): Aorta to innominate, left common carotid, and left subclavian arterial bypass 12/30/2009. Right carotid endarterectomy 05/06/2011    DUPLEX EXAM:     RIGHT  LEFT  Peak Systolic Velocities (cm/s) End Diastolic Velocities (cm/s) Plaque LOCATION Peak Systolic Velocities (cm/s) End Diastolic Velocities (cm/s) Plaque  183 42  CCA PROXIMAL 113 29   147 42  CCA MID 87 29   138 29 HM CCA DISTAL 67 21 HT  166 30  ECA 270 36 HT  86 24  ICA PROXIMAL 244 69 HT  100 32  ICA MID 214 47   117 34  ICA DISTAL 96 27     NA ICA / CCA Ratio (PSV) 3.6  Antegrade  Vertebral Flow Antegrade    Brachial Systolic Pressure (mmHg)   Within normal limits  Brachial Artery Waveforms Within normal limits     Plaque Morphology:  HM = Homogeneous, HT = Heterogeneous, CP = Calcific Plaque, SP = Smooth Plaque, IP = Irregular Plaque     ADDITIONAL FINDINGS:     IMPRESSION: 1. Patent right carotid endarterectomy with minimal (<40%) disease at the proximal patch site. 2. Evidence of 60%-79% stenosis of the left internal carotid artery. 3. Bilateral vertebral artery is  antegrade.    Compared to the previous exam:  No significant change compared to prior exam.     Assessment: Jennifer [name] is a [age] y.o. 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  Patent right carotid endarterectomy with minimal (<40%) disease at the proximal patch site. Evidence of 60%-79% stenosis of the left internal carotid artery. Bilateral vertebral artery is antegrade. No significant change compared to prior exam.    Plan: Follow-up in 6 months with Carotid Duplex scan.   I discussed in depth with the patient the nature of atherosclerosis, and emphasized the importance of maximal medical management including strict control of blood pressure, blood glucose, and lipid levels, obtaining regular exercise, and continued cessation of smoking.  The patient is aware that without maximal medical management the underlying atherosclerotic disease process will progress, limiting the benefit of any interventions. The patient was given information about stroke prevention and what symptoms should prompt the patient to seek immediate medical care. Thank you for allowing us to participate in this patient's care.  Charisse MarchSuzanne Nickel, RN, MSN, FNP-C [facility] Office: [phone]  Clinic Physician: Early  06/19/2013 10:55 AM

## 2013-07-02 ENCOUNTER — Telehealth: Payer: Self-pay | Admitting: Cardiovascular Disease

## 2013-07-02 MED ORDER — METOPROLOL SUCCINATE ER 25 MG PO TB24: 12.5000 mg | ORAL_TABLET | Freq: Every day | ORAL | Status: DC

## 2013-07-02 NOTE — Telephone Encounter (Signed)
Rx was refilled to new pharmacy, left message on answering machine letting patient know.

## 2013-07-02 NOTE — Telephone Encounter (Signed)
She need her Metoprolol refill asap. She does have an appt on 07-10-13 with Corine ShelterLuke Kilroy. Would you please call in enough until her appt. Please call this to her new pharmacy-[facility] [phone]

## 2013-07-10 ENCOUNTER — Ambulatory Visit (INDEPENDENT_AMBULATORY_CARE_PROVIDER_SITE_OTHER): Payer: Medicare Other | Admitting: Cardiology

## 2013-07-10 ENCOUNTER — Encounter: Payer: Self-pay | Admitting: Cardiology

## 2013-07-10 VITALS — BP 182/100 | HR 68 | Ht 63.0 in | Wt 155.8 lb

## 2013-07-10 DIAGNOSIS — N181 Chronic kidney disease, stage 1: Secondary | ICD-10-CM

## 2013-07-10 DIAGNOSIS — E785 Hyperlipidemia, unspecified: Secondary | ICD-10-CM

## 2013-07-10 DIAGNOSIS — I739 Peripheral vascular disease, unspecified: Secondary | ICD-10-CM

## 2013-07-10 DIAGNOSIS — I2510 Atherosclerotic heart disease of native coronary artery without angina pectoris: Secondary | ICD-10-CM

## 2013-07-10 DIAGNOSIS — I10 Essential (primary) hypertension: Secondary | ICD-10-CM

## 2013-07-10 MED ORDER — AMLODIPINE BESYLATE 5 MG PO TABS: 5.0000 mg | ORAL_TABLET | Freq: Every day | ORAL | Status: DC

## 2013-07-10 MED ORDER — METOPROLOL SUCCINATE ER 25 MG PO TB24: 12.5000 mg | ORAL_TABLET | Freq: Every day | ORAL | Status: DC

## 2013-07-10 NOTE — Assessment & Plan Note (Signed)
Elevated in the office today

## 2013-07-10 NOTE — Progress Notes (Signed)
07/10/2013 Rodolph Bong   [birth year]  [id number]  Primary Physicia Ailene Ravel, MD Primary Cardiologist: Dr Allyson Sabal  HPI:  The pt is a 73 year old mildly overweight married Caucasian female mother of 2 children, grandmother and one grandchild was formally a patient of Dr. Algie Coffer.  She is a retired Museum/gallery exhibitions officer for [employer] and her Niece [name], is an Charity fundraiser at the surgical center. The pt has a history of tobacco use, having quit in November of 2011 after 25 pack years. She has treated hypertension and dyslipidemia. She has a history of left carotid to subclavian artery bypass grafting by Dr. Myra Gianotti as post coronary artery bypass grafting x4 12/28/09 by Dr. Rexanne Mano. She's also had a lack of right carotid endarterectomy performed by Dr. Myra Gianotti March 2013. He follows her not as an outpatient noninvasively. The pt tells me recnt doppler have boeen "OK". She is here for routine office visit. She denies chest pain or shortness of breath, or claudication.    Current Outpatient Prescriptions  Medication Sig Dispense Refill  . ALPHA LIPOIC ACID PO Take 250 mg by mouth daily.      Marland Kitchen aspirin EC 81 MG tablet Take 81 mg by mouth every other day.      . calcium carbonate (OS-CAL - DOSED IN MG OF ELEMENTAL CALCIUM) 1250 MG tablet Take 1,600 tablets by mouth daily.       . cholecalciferol (VITAMIN D) 1000 UNITS tablet Take 1,000 Units by mouth 3 (three) times a week.      . co-enzyme Q-10 50 MG capsule Take 100 mg by mouth daily.      . Flaxseed, Linseed, 1000 MG CAPS Take 1 capsule by mouth daily.       . Garlic (GARLIQUE PO) Take 1 tablet by mouth daily.      . lansoprazole (PREVACID) 15 MG capsule Take 15 mg by mouth daily.      . Magnesium 250 MG TABS Take 1 tablet by mouth daily.      . metoprolol succinate (TOPROL-XL) 25 MG 24 hr tablet Take 0.5 tablets (12.5 mg total) by mouth daily.  30 tablet  6  . Multiple Vitamin (MULITIVITAMIN WITH MINERALS) TABS Take 1 tablet by mouth  daily.      . Omega-3 Fatty Acids (FISH OIL) 1000 MG CAPS Take 1,000 mg by mouth daily. Mega Krill Fish oil      . Quercetin 50 MG TABS Take 1 tablet by mouth daily.       . rosuvastatin (CRESTOR) 10 MG tablet Take 10 mg by mouth 3 (three) times a week. On Monday, Wednesday, and Friday       . vitamin B-12 (CYANOCOBALAMIN) 500 MCG tablet Take 500 mcg by mouth daily.      . Vitamin Mixture (ESTER-C) 500-60 MG TABS Take 500 mg by mouth daily.      Marland Kitchen amLODipine (NORVASC) 5 MG tablet Take 1 tablet (5 mg total) by mouth daily.  180 tablet  3  . [DISCONTINUED] Calcium Carb-Cholecalciferol (CALTRATE 600+D) 600-800 MG-UNIT TABS Take by mouth.      . [DISCONTINUED] calcium citrate-vitamin D (CITRACAL+D) 315-200 MG-UNIT per tablet Take 1 tablet by mouth 2 (two) times daily.      . [DISCONTINUED] furosemide (LASIX) 40 MG tablet Take 40 mg by mouth daily.        . [DISCONTINUED] potassium chloride 20 MEQ/15ML (10%) solution Take by mouth daily.         No current  facility-administered medications for this visit.    Allergies  Allergen Reactions  . Statins Anaphylaxis    High dose   . Penicillins Hives and Rash    History   Social History  . Marital Status: Married    Spouse Name: N/A    Number of Children: N/A  . Years of Education: N/A   Occupational History  . Not on file.   Social History Main Topics  . Smoking status: Former Smoker    Quit date: 12/16/2009  . Smokeless tobacco: Never Used  . Alcohol Use: No     Comment: VERY LITTLE  . Drug Use: No  . Sexual Activity: Not on file   Other Topics Concern  . Not on file   Social History Narrative  . No narrative on file     Review of Systems: General: negative for chills, fever, night sweats or weight changes.  Cardiovascular: negative for chest pain, dyspnea on exertion, edema, orthopnea, palpitations, paroxysmal nocturnal dyspnea or shortness of breath Dermatological: negative for rash Respiratory: negative for cough or  wheezing Urologic: negative for hematuria Abdominal: negative for nausea, vomiting, diarrhea, bright red blood per rectum, melena, or hematemesis Neurologic: negative for visual changes, syncope, or dizziness All other systems reviewed and are otherwise negative except as noted above.    Blood pressure 182/100, pulse 68, height 5\' 3"  (1.6 m), weight 155 lb 12.8 oz (70.67 kg).  General appearance: alert, cooperative and no distress Neck: no JVD and a high pitched LCA bruit Lungs: clear to auscultation bilaterally  EKG NSR  ASSESSMENT AND PLAN:   CABG x 4 2011 No angina, NL LVF Nov 2011  Essential hypertension Elevated in the office today  Hyperlipidemia Recent LDL 131, intolerant to higher doses of statins  PVD- Lt CCA/Lt SCA BPG Nov 2011 with residual 60-79% LICA Dr Myra GianottiBrabham follows, recent dopplers "OK"   PLAN  I asked the pt to keep track of her B/P at home. I did increase her Norvasc to 5 mg daily. She says she has "renal insufficiency" But recent labs show a SCr of 1.06. We'll see her back in 3 months.   Eda PaschalLuke K [facility] CenterKilroyPA-C 07/10/2013 1:56 PM

## 2013-07-10 NOTE — Assessment & Plan Note (Signed)
No angina, NL LVF Nov 2011

## 2013-07-10 NOTE — Assessment & Plan Note (Signed)
Dr Myra Gianotti follows, recent dopplers "OK"

## 2013-07-10 NOTE — Patient Instructions (Signed)
Jennifer [name] has recommended making the following medication changes:  INCREASE Amlodipine to 5 mg daily. You can take 2 tablets of your current bottle until it runs out.  A new prescription has been sent to your pharmacy electronically.  Your physician recommends that you schedule a follow-up appointment in: 3 months with Corine Shelter, PA-C.

## 2013-07-10 NOTE — Assessment & Plan Note (Signed)
Recent LDL 131, intolerant to higher doses of statins

## 2013-07-11 ENCOUNTER — Other Ambulatory Visit: Payer: Self-pay | Admitting: *Deleted

## 2013-07-11 NOTE — Telephone Encounter (Signed)
Pharmacy needed clarification for patient medication, patients husband went into pharmacy and was confused on dosage. Medication issue was resolved.

## 2013-10-02 ENCOUNTER — Ambulatory Visit: Payer: Medicare Other | Admitting: Cardiology

## 2013-10-17 ENCOUNTER — Ambulatory Visit: Payer: Medicare Other | Admitting: Cardiology

## 2013-10-30 ENCOUNTER — Ambulatory Visit (INDEPENDENT_AMBULATORY_CARE_PROVIDER_SITE_OTHER): Payer: Medicare Other | Admitting: Cardiology

## 2013-10-30 ENCOUNTER — Encounter: Payer: Self-pay | Admitting: Cardiology

## 2013-10-30 VITALS — BP 180/100 | HR 75 | Ht 63.0 in | Wt 157.8 lb

## 2013-10-30 DIAGNOSIS — I739 Peripheral vascular disease, unspecified: Secondary | ICD-10-CM

## 2013-10-30 DIAGNOSIS — I10 Essential (primary) hypertension: Secondary | ICD-10-CM

## 2013-10-30 DIAGNOSIS — E785 Hyperlipidemia, unspecified: Secondary | ICD-10-CM

## 2013-10-30 NOTE — Assessment & Plan Note (Signed)
Followed by PCP

## 2013-10-30 NOTE — Patient Instructions (Signed)
NO CHANGE WITH CURRENT MEDICATION  Your physician wants you to follow-up in 6 MONTH DR BERRY. You will receive a reminder letter in the mail two months in advance. If you don't receive a letter, please call our office to schedule the follow-up appointment.

## 2013-10-30 NOTE — Assessment & Plan Note (Signed)
She appears to have "white coat" hypertension.

## 2013-10-30 NOTE — Assessment & Plan Note (Signed)
RCEA March 2013

## 2013-10-30 NOTE — Progress Notes (Signed)
10/30/2013 Jennifer [name]   Aug 21, 1940  [id number]  Primary Physicia Ailene Ravel, MD Primary Cardiologist: Dr Allyson Sabal  HPI:  73 year old mildly overweight married Caucasian female mother of 2 children, grandmother and one grandchild was formally a patient of Dr. Algie Coffer. She is a retired Museum/gallery exhibitions officer for [employer] and her Niece [name], is an Charity fundraiser at the surgical center. The pt has a history of tobacco use, having quit in November of 2011 after 25 pack years. She has treated hypertension and dyslipidemia. She has a history of left carotid to subclavian artery bypass grafting by Dr. Myra Gianotti as post coronary artery bypass grafting x4 in Nov 2011 by Dr. Rexanne Mano. She's also s/p right carotid endarterectomy performed by Dr. Myra Gianotti March 2013. She is here for 6 month check up. I increased her Norvasc at LOV. She brought in readings from home and her B/P appears well controlled with systolic B/P anywhere from 106 to 130. In the office her B/P is 180 over 88. She denies any chest pain or unusual SOB.    Current Outpatient Prescriptions  Medication Sig Dispense Refill  . ALPHA LIPOIC ACID PO Take 250 mg by mouth daily.      Marland Kitchen amLODipine (NORVASC) 5 MG tablet Take 1 tablet (5 mg total) by mouth daily.  180 tablet  3  . aspirin EC 81 MG tablet Take 81 mg by mouth every other day.      . calcium carbonate (OS-CAL - DOSED IN MG OF ELEMENTAL CALCIUM) 1250 MG tablet Take 1,600 tablets by mouth daily.       . cholecalciferol (VITAMIN D) 1000 UNITS tablet Take 1,000 Units by mouth 3 (three) times a week.      . co-enzyme Q-10 50 MG capsule Take 100 mg by mouth daily.      . Flaxseed, Linseed, 1000 MG CAPS Take 1 capsule by mouth daily.       . Garlic (GARLIQUE PO) Take 1 tablet by mouth daily.      . lansoprazole (PREVACID) 15 MG capsule Take 15 mg by mouth daily.      . Magnesium 250 MG TABS Take 1 tablet by mouth daily.      . metoprolol succinate (TOPROL-XL) 25 MG 24 hr tablet Take 0.5  tablets (12.5 mg total) by mouth daily.  30 tablet  6  . Multiple Vitamin (MULITIVITAMIN WITH MINERALS) TABS Take 1 tablet by mouth daily.      . Omega-3 Fatty Acids (FISH OIL) 1000 MG CAPS Take 1,000 mg by mouth daily. Mega Krill Fish oil      . Quercetin 50 MG TABS Take 1 tablet by mouth daily.       . rosuvastatin (CRESTOR) 10 MG tablet Take 10 mg by mouth 3 (three) times a week. On Monday, Wednesday, and Friday       . vitamin B-12 (CYANOCOBALAMIN) 500 MCG tablet Take 500 mcg by mouth daily.      . Vitamin Mixture (ESTER-C) 500-60 MG TABS Take 500 mg by mouth daily.      . [DISCONTINUED] Calcium Carb-Cholecalciferol (CALTRATE 600+D) 600-800 MG-UNIT TABS Take by mouth.      . [DISCONTINUED] calcium citrate-vitamin D (CITRACAL+D) 315-200 MG-UNIT per tablet Take 1 tablet by mouth 2 (two) times daily.      . [DISCONTINUED] furosemide (LASIX) 40 MG tablet Take 40 mg by mouth daily.        . [DISCONTINUED] potassium chloride 20 MEQ/15ML (10%) solution Take by mouth  daily.         No current facility-administered medications for this visit.    Allergies  Allergen Reactions  . Statins Anaphylaxis    High dose   . Penicillins Hives and Rash    History   Social History  . Marital Status: Married    Spouse Name: N/A    Number of Children: N/A  . Years of Education: N/A   Occupational History  . Not on file.   Social History Main Topics  . Smoking status: Former Smoker    Quit date: 12/16/2009  . Smokeless tobacco: Never Used  . Alcohol Use: No     Comment: VERY LITTLE  . Drug Use: No  . Sexual Activity: Not on file   Other Topics Concern  . Not on file   Social History Narrative  . No narrative on file     Review of Systems: General: negative for chills, fever, night sweats or weight changes.  Cardiovascular: negative for chest pain, dyspnea on exertion, edema, orthopnea, palpitations, paroxysmal nocturnal dyspnea or shortness of breath Dermatological: negative for  rash Respiratory: negative for cough or wheezing Urologic: negative for hematuria Abdominal: negative for nausea, vomiting, diarrhea, bright red blood per rectum, melena, or hematemesis Neurologic: negative for visual changes, syncope, or dizziness All other systems reviewed and are otherwise negative except as noted above.    Blood pressure 180/100, pulse 75, height  (1.6 m), weight 157 lb 12.8 oz (71.578 kg).  General appearance: alert, cooperative, no distress and mildly obese Neck: no JVD and RCEA scar, bilat carotid bruits Lungs: clear to auscultation bilaterally Heart: regular rate and rhythm Extremities: no LE edema  EKG NSR without acute changes  ASSESSMENT AND PLAN:   Essential hypertension She appears to have "white coat" hypertension.   CABG x 4 2011 No angina  Carotid artery stenosis and occlusion RCEA March 2013  PVD- Lt CCA/Lt SCA BPG Nov 2011 and RCEA 2013 with residual moderate LICA disase B/P is taken in her Rt arm  Hyperlipidemia Followed by PCP   PLAN  I asked Jennifer [name] to have her B/P checked at a pharmacy. If there is a discrepancy between her home cuff and the readings of the pharmacy cuff she should bring her home cuff in to be checked here. Otherwise, she can see Dr Allyson Sabal in 6 months. She is going to see her PCP in 6 weeks and I asked that a copy of her labs be sent to [place].  Jennifer [name] KPA-C 10/30/2013 10:23 AM

## 2013-10-30 NOTE — Assessment & Plan Note (Signed)
B/P is taken in her Rt arm

## 2013-10-30 NOTE — Assessment & Plan Note (Signed)
No angina 

## 2013-12-18 ENCOUNTER — Ambulatory Visit: Payer: Medicare Other | Admitting: Family

## 2013-12-18 ENCOUNTER — Other Ambulatory Visit (HOSPITAL_COMMUNITY): Payer: Medicare Other

## 2014-01-21 ENCOUNTER — Encounter: Payer: Medicare Other | Admitting: Family

## 2014-01-21 ENCOUNTER — Encounter: Payer: Self-pay | Admitting: Family

## 2014-01-21 ENCOUNTER — Ambulatory Visit (HOSPITAL_COMMUNITY)
Admission: RE | Admit: 2014-01-21 | Discharge: 2014-01-21 | Disposition: A | Discharge status: Home / Self Care | Payer: Medicare Other | Source: Ambulatory Visit | Attending: Family | Admitting: Family

## 2014-01-21 DIAGNOSIS — I6523 Occlusion and stenosis of bilateral carotid arteries: Secondary | ICD-10-CM | POA: Insufficient documentation

## 2014-01-21 DIAGNOSIS — Z48812 Encounter for surgical aftercare following surgery on the circulatory system: Secondary | ICD-10-CM | POA: Insufficient documentation

## 2014-01-22 ENCOUNTER — Other Ambulatory Visit: Payer: Self-pay

## 2014-01-22 ENCOUNTER — Encounter: Payer: Self-pay | Admitting: Family

## 2014-01-22 DIAGNOSIS — I6521 Occlusion and stenosis of right carotid artery: Secondary | ICD-10-CM

## 2014-01-22 DIAGNOSIS — Z48812 Encounter for surgical aftercare following surgery on the circulatory system: Secondary | ICD-10-CM

## 2014-01-22 NOTE — Patient Instructions (Signed)
Dear Ms. Rica Motekas, Your recent Vascular Lab study on January 21, 2014 indicates: NO significant change compared to prior exam Jun 19, 2013. Please follow up in  6 months.          Stroke Prevention Some medical conditions and behaviors are associated with an increased chance of having a stroke. You may prevent a stroke by making healthy choices and managing medical conditions. HOW CAN I REDUCE MY RISK OF HAVING A STROKE?   Stay physically active. Get at least 30 minutes of activity on most or all days.  Do not smoke. It may also be helpful to avoid exposure to secondhand smoke.  Limit alcohol use. Moderate alcohol use is considered to be:  No more than 2 drinks per day for men.  No more than 1 drink per day for nonpregnant women.  Eat healthy foods. This involves:  Eating 5 or more servings of fruits and vegetables a day.  Making dietary changes that address high blood pressure (hypertension), high cholesterol, diabetes, or obesity.  Manage your cholesterol levels.  Making food choices that are high in fiber and low in saturated fat, trans fat, and cholesterol may control cholesterol levels.  Take any prescribed medicines to control cholesterol as directed by your health care provider.  Manage your diabetes.  Controlling your carbohydrate and sugar intake is recommended to manage diabetes.  Take any prescribed medicines to control diabetes as directed by your health care provider.  Control your hypertension.  Making food choices that are low in salt (sodium), saturated fat, trans fat, and cholesterol is recommended to manage hypertension.  Take any prescribed medicines to control hypertension as directed by your health care provider.  Maintain a healthy weight.  Reducing calorie intake and making food choices that are low in sodium, saturated fat, trans fat, and cholesterol are recommended to manage weight.  Stop drug abuse.  Avoid taking birth control  pills.  Talk to your health care provider about the risks of taking birth control pills if you are over 71 years old, smoke, get migraines, or have ever had a blood clot.  Get evaluated for sleep disorders (sleep apnea).  Talk to your health care provider about getting a sleep evaluation if you snore a lot or have excessive sleepiness.  Take medicines only as directed by your health care provider.  For some people, aspirin or blood thinners (anticoagulants) are helpful in reducing the risk of forming abnormal blood clots that can lead to stroke. If you have the irregular heart rhythm of atrial fibrillation, you should be on a blood thinner unless there is a good reason you cannot take them.  Understand all your medicine instructions.  Make sure that other conditions (such as anemia or atherosclerosis) are addressed. SEEK IMMEDIATE MEDICAL CARE IF:   You have sudden weakness or numbness of the face, arm, or leg, especially on one side of the body.  Your face or eyelid droops to one side.  You have sudden confusion.  You have trouble speaking (aphasia) or understanding.  You have sudden trouble seeing in one or both eyes.  You have sudden trouble walking.  You have dizziness.  You have a loss of balance or coordination.  You have a sudden, severe headache with no known cause.  You have new chest pain or an irregular heartbeat. Any of these symptoms may represent a serious problem that is an emergency. Do not wait to see if the symptoms will go away. Get medical help at once.  Call your local emergency services (911 in [place] Do not drive yourself to the hospital. Document Released: 03/11/2004 Document Revised: 06/18/2013 Document Reviewed: 08/04/2012 [facility] Patient Information 2015 [place], [place]. This information is not intended to replace advice given to you by your health care provider. Make sure you discuss any questions you have with your health care provider.

## 2014-01-22 NOTE — Progress Notes (Signed)
Lab only 

## 2014-01-24 ENCOUNTER — Encounter: Payer: Self-pay | Admitting: Surgery

## 2014-01-29 ENCOUNTER — Ambulatory Visit: Payer: Medicare Other | Admitting: Family

## 2014-01-29 ENCOUNTER — Other Ambulatory Visit (HOSPITAL_COMMUNITY): Payer: Medicare Other

## 2014-05-07 ENCOUNTER — Ambulatory Visit (INDEPENDENT_AMBULATORY_CARE_PROVIDER_SITE_OTHER): Payer: Medicare Other | Admitting: Cardiovascular Disease

## 2014-05-07 ENCOUNTER — Encounter: Payer: Self-pay | Admitting: Cardiovascular Disease

## 2014-05-07 VITALS — BP 162/90 | HR 81 | Ht 63.0 in | Wt 162.0 lb

## 2014-05-07 DIAGNOSIS — E785 Hyperlipidemia, unspecified: Secondary | ICD-10-CM | POA: Diagnosis not present

## 2014-05-07 DIAGNOSIS — I10 Essential (primary) hypertension: Secondary | ICD-10-CM

## 2014-05-07 DIAGNOSIS — I739 Peripheral vascular disease, unspecified: Secondary | ICD-10-CM

## 2014-05-07 DIAGNOSIS — I2510 Atherosclerotic heart disease of native coronary artery without angina pectoris: Secondary | ICD-10-CM

## 2014-05-07 NOTE — Assessment & Plan Note (Signed)
History of left carotid to subclavian bypass by Dr. Myra GianottiBrabham at the time of her coronary bypass grafting in 2011 with subsequent right carotid endarterectomy performed in 2013 by Dr. Myra GianottiBrabham. He follows these by duplex ultrasound.

## 2014-05-07 NOTE — Assessment & Plan Note (Signed)
History of hypertension with blood pressure measures A1 62/90. She is on amlodipine 5 mg a day addition to metoprolol. She says her blood pressure were measured, his normal. Continue current meds at current dosing

## 2014-05-07 NOTE — Assessment & Plan Note (Signed)
History of coronary artery disease status post bypass grafting by Dr. Laneta SimmersBartle 12/28/09 with 4 grafts. She is asymptomatic

## 2014-05-07 NOTE — Progress Notes (Signed)
05/07/2014 Rodolph BongWanda S [name]   10/28/1940  [id number]  Primary Physician Ailene RavelHAMRICK,MAURA L, MD Primary Cardiologist: Runell GessJonathan J. Adonias Demore MD Roseanne RenoFACP,FACC,FAHA, FSCAI   HPI:   Jennifer [name] is a 74 year old mildly overweight married Caucasian female mother of 2 children, grandmother and one grandchild was formally a patient of Dr. Algie CofferKadakia.I last saw her in the office 11/13/12. 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 Risk factors include discontinue tobacco use having quit in November of 2011 and smoked 25 pack years. She has treated hypertension and dyslipidemia. She has a history of left carotid to subclavian artery bypass grafting by Dr. Myra GianottiBrabham as post coronary artery bypass grafting x411/13/11 by Dr. Rexanne ManoBrian Bartle. She's also had a lack of right carotid endarterectomy performed by Dr. Myra GianottiBrabham 3/13 He follows her as an outpatient noninvasively. She denies chest pain or shortness of breath, or claudication. She saw Corine ShelterLuke Kilroy in the office 07/10/13 who adjusted her blood pressure medicines. Since that time she's been asymptomatic denying chest pain or shortness of breath.   Current Outpatient Prescriptions  Medication Sig Dispense Refill  . ALPHA LIPOIC ACID PO Take 250 mg by mouth daily.    Marland Kitchen. amLODipine (NORVASC) 5 MG tablet Take 1 tablet (5 mg total) by mouth daily. 180 tablet 3  . aspirin EC 81 MG tablet Take 81 mg by mouth every other day.    . cholecalciferol (VITAMIN D) 1000 UNITS tablet Take 1,000 Units by mouth 3 (three) times a week.    . co-enzyme Q-10 50 MG capsule Take 100 mg by mouth daily.    . Flaxseed, Linseed, 1000 MG CAPS Take 1 capsule by mouth daily.     . Garlic (GARLIQUE PO) Take 1 tablet by mouth daily.    . lansoprazole (PREVACID) 15 MG capsule Take 15 mg by mouth daily.    . Magnesium 250 MG TABS Take 1 tablet by mouth daily.    . metoprolol succinate (TOPROL-XL) 25 MG 24 hr tablet Take 0.5 tablets (12.5 mg total) by mouth daily. 30 tablet 6  . Multiple Vitamin  (MULITIVITAMIN WITH MINERALS) TABS Take 1 tablet by mouth daily.    . Omega-3 Fatty Acids (FISH OIL) 1000 MG CAPS Take 1,000 mg by mouth daily. Mega Krill Fish oil    . rosuvastatin (CRESTOR) 10 MG tablet Take 10 mg by mouth 3 (three) times a week. On Monday, Wednesday, and Friday     . vitamin B-12 (CYANOCOBALAMIN) 500 MCG tablet Take 500 mcg by mouth daily.    . Vitamin Mixture (ESTER-C) 500-60 MG TABS Take 500 mg by mouth daily.    . [DISCONTINUED] Calcium Carb-Cholecalciferol (CALTRATE 600+D) 600-800 MG-UNIT TABS Take by mouth.    . [DISCONTINUED] calcium citrate-vitamin D (CITRACAL+D) 315-200 MG-UNIT per tablet Take 1 tablet by mouth 2 (two) times daily.    . [DISCONTINUED] furosemide (LASIX) 40 MG tablet Take 40 mg by mouth daily.      . [DISCONTINUED] potassium chloride 20 MEQ/15ML (10%) solution Take by mouth daily.       No current facility-administered medications for this visit.    Allergies  Allergen Reactions  . Statins Anaphylaxis    High dose ( Crestor 20 mg , pt cannot take)  . Penicillins Hives and Rash    History   Social History  . Marital Status: Married    Spouse Name: N/A  . Number of Children: N/A  . Years of Education: N/A   Occupational History  . Not on file.  Social History Main Topics  . Smoking status: Former Smoker    Quit date: 12/16/2009  . Smokeless tobacco: Never Used  . Alcohol Use: No     Comment: VERY LITTLE  . Drug Use: No  . Sexual Activity: Not on file   Other Topics Concern  . Not on file   Social History Narrative     Review of Systems: General: negative for chills, fever, night sweats or weight changes.  Cardiovascular: negative for chest pain, dyspnea on exertion, edema, orthopnea, palpitations, paroxysmal nocturnal dyspnea or shortness of breath Dermatological: negative for rash Respiratory: negative for cough or wheezing Urologic: negative for hematuria Abdominal: negative for nausea, vomiting, diarrhea, bright red  blood per rectum, melena, or hematemesis Neurologic: negative for visual changes, syncope, or dizziness All other systems reviewed and are otherwise negative except as noted above.    Blood pressure 162/90, pulse 81, height  (1.6 m), weight 162 lb (73.483 kg).  General appearance: alert and no distress Neck: no adenopathy, no carotid bruit, no JVD, supple, symmetrical, trachea midline and thyroid not enlarged, symmetric, no tenderness/mass/nodules Lungs: clear to auscultation bilaterally Heart: regular rate and rhythm, S1, S2 normal, no murmur, click, rub or gallop Extremities: extremities normal, atraumatic, no cyanosis or edema  EKG sinus rhythm at 81 with nonspecific ST and T-wave changes. I personally reviewed this EKG  ASSESSMENT AND PLAN:   PVD- Lt CCA/Lt SCA BPG Nov 2011 and RCEA 2013 with residual moderate LICA disase History of left carotid to subclavian bypass by Dr. Myra Gianotti at the time of her coronary bypass grafting in 2011 with subsequent right carotid endarterectomy performed in 2013 by Dr. Myra Gianotti. He follows these by duplex ultrasound.   Hyperlipidemia History of hyperlipidemia on rosuvastatin 10 mg a day followed by her PCP   Essential hypertension History of hypertension with blood pressure measures A1 62/90. She is on amlodipine 5 mg a day addition to metoprolol. She says her blood pressure were measured, his normal. Continue current meds at current dosing   CABG x 4 2011 History of coronary artery disease status post bypass grafting by Dr. Laneta Simmers 12/28/09 with 4 grafts. She is asymptomatic       Runell Gess MD FACP,FACC,FAHA, [facility] 05/07/2014 10:47 AM

## 2014-05-07 NOTE — Patient Instructions (Signed)
Dr Berry recommends that you schedule a follow-up appointment in 6 months with an extender - Luke Kilroy, PA-C.  Dr Berry wants you to follow-up in 1 year. You will receive a reminder letter in the mail two months in advance. If you don't receive a letter, please call our office to schedule the follow-up appointment. 

## 2014-05-07 NOTE — Assessment & Plan Note (Signed)
History of hyperlipidemia on rosuvastatin 10 mg a day followed by her PCP 

## 2014-07-16 DIAGNOSIS — N183 Chronic kidney disease, stage 3 (moderate): Secondary | ICD-10-CM | POA: Diagnosis not present

## 2014-07-16 DIAGNOSIS — K219 Gastro-esophageal reflux disease without esophagitis: Secondary | ICD-10-CM | POA: Diagnosis not present

## 2014-07-16 DIAGNOSIS — I10 Essential (primary) hypertension: Secondary | ICD-10-CM | POA: Diagnosis not present

## 2014-07-16 DIAGNOSIS — E780 Pure hypercholesterolemia: Secondary | ICD-10-CM | POA: Diagnosis not present

## 2014-07-16 DIAGNOSIS — Z9181 History of falling: Secondary | ICD-10-CM | POA: Diagnosis not present

## 2014-07-16 DIAGNOSIS — Z79899 Other long term (current) drug therapy: Secondary | ICD-10-CM | POA: Diagnosis not present

## 2014-07-16 DIAGNOSIS — Z1389 Encounter for screening for other disorder: Secondary | ICD-10-CM | POA: Diagnosis not present

## 2014-07-24 ENCOUNTER — Encounter: Payer: Self-pay | Admitting: Family

## 2014-07-29 ENCOUNTER — Ambulatory Visit (INDEPENDENT_AMBULATORY_CARE_PROVIDER_SITE_OTHER): Payer: Medicare Other | Admitting: Family

## 2014-07-29 ENCOUNTER — Ambulatory Visit (HOSPITAL_COMMUNITY)
Admission: RE | Admit: 2014-07-29 | Discharge: 2014-07-29 | Disposition: A | Discharge status: Home / Self Care | Payer: Medicare Other | Source: Ambulatory Visit | Attending: Family | Admitting: Family

## 2014-07-29 ENCOUNTER — Encounter: Payer: Self-pay | Admitting: Family

## 2014-07-29 VITALS — BP 148/90 | HR 81 | Resp 16 | Ht 63.0 in | Wt 156.0 lb

## 2014-07-29 DIAGNOSIS — Z48812 Encounter for surgical aftercare following surgery on the circulatory system: Secondary | ICD-10-CM

## 2014-07-29 DIAGNOSIS — I6521 Occlusion and stenosis of right carotid artery: Secondary | ICD-10-CM | POA: Diagnosis not present

## 2014-07-29 DIAGNOSIS — I6522 Occlusion and stenosis of left carotid artery: Secondary | ICD-10-CM

## 2014-07-29 DIAGNOSIS — Z9889 Other specified postprocedural states: Secondary | ICD-10-CM | POA: Diagnosis not present

## 2014-07-29 DIAGNOSIS — Z87891 Personal history of nicotine dependence: Secondary | ICD-10-CM | POA: Diagnosis not present

## 2014-07-29 NOTE — Progress Notes (Signed)
Established Carotid Patient   History of Present Illness  Jennifer [name] is a 74 y.o. female patient of Dr. Myra Gianotti who is status post ascending aorta innominate left CCA and left subclavian artery bypass graft in November 2011, and a right carotid endarterectomy in March 2013. She returns today for follow up. She denies tingling, numbness, weakness, or pain in either hand/arm, attributes transient dizziness to amlodipine, about 30 minutes after taking it, denies any other dizziness issues. Patient reports headaches and right arm weakness with 20 mg Crestor qd which was stopped, mild myalgias on 10 mg Crestor 3x/week currently.  Pt. states blood pressure usually runs 134/76 or less, states her blood pressure goes up in Dr. offices.  Is walking twice/week, 2 miles.   Had 4 vessel CABG 2011, denies MI.  Patient has Negative history of TIA or stroke symptom. The patient denies amaurosis fugax or monocular blindness. The patient denies facial drooping.  Pt. denies hemiplegia. The patient denies receptive or expressive aphasia.  Pt denies claudication symptoms in legs with walking. Patient denies New Medical or Surgical History.   Pt Diabetic: No  Pt smoker: former smoker, quit in 2011 Pt meds include:  Statin : Yes, cannot tolerate high dose statins, had muscle spasms, is taking 10 mg Crestor 3x/week Betablocker: Yes  ASA: Yes  Other anticoagulants/antiplatelets: no      Past Medical History  Diagnosis Date  . Joint pain   . Eyesight diminished Change in eyesight  . Shortness of breath on exertion   . Hypertension   . Hypercholesterolemia   . Coronary artery disease Oct. 2011  . Carotid artery occlusion   . Anemia   . Blood transfusion     1965  . GERD (gastroesophageal reflux disease)   . No pertinent past medical history     HX KIDNEY STONES  . Headache(784.0)   . Arthritis     Social History History  Substance Use Topics  . Smoking status: Former Smoker   Quit date: 12/16/2009  . Smokeless tobacco: Never Used  . Alcohol Use: No     Comment: VERY LITTLE    Family History Family History  Problem Relation Age of Onset  . Diabetes Mother     Late onset  . Hyperlipidemia Mother   . Hypertension Mother   . Heart attack Mother   . Diabetes Sister     Late onset  . Hyperlipidemia Sister   . Hypertension Sister     AAA  . Cancer Brother     Prostate  . Heart disease Brother   . Hypertension Brother   . Cancer Brother     Prostate     Surgical History Past Surgical History  Procedure Laterality Date  . Carotid artery - subclavian artery bypass graft    . Coronary artery bypass graft      12/2009    . Cardiac catheterization      2011  [facility]  . No past surgeries    . Cardiovascular stress test      2012 DR [facility] ALSO ECHO    . Eye surgery      LASER BIL X2 FOR GLAUCOMA  . Abdominal hysterectomy    . Cesarean section      X2   . Breast biopsy      BILATERAL  BENIGN   . Endarterectomy  05/06/2011    Procedure: ENDARTERECTOMY CAROTID;  Surgeon: Nada Libman, MD;  Location: [facility] OR;  Service: Vascular;  Laterality: Right;  right carotid artery endarterctomy with vascu guard patch angioplasty   . Carotid endarterectomy  05/06/11    Right CEA  . Pr vein bypass graft,aorto-fem-pop      Allergies  Allergen Reactions  . Statins Anaphylaxis    High dose ( Crestor 20 mg , pt cannot take)  . Penicillins Hives and Rash    Current Outpatient Prescriptions  Medication Sig Dispense Refill  . ALPHA LIPOIC ACID PO Take 250 mg by mouth daily.    Marland Kitchen amLODipine (NORVASC) 5 MG tablet Take 1 tablet (5 mg total) by mouth daily. 180 tablet 3  . aspirin EC 81 MG tablet Take 81 mg by mouth every other day.    . cholecalciferol (VITAMIN D) 1000 UNITS tablet Take 1,000 Units by mouth 3 (three) times a week.    . co-enzyme Q-10 50 MG capsule Take 100 mg by mouth daily.    . Flaxseed, Linseed, 1000 MG CAPS Take 1 capsule by mouth  daily.     . Garlic (GARLIQUE PO) Take 1 tablet by mouth daily.    . lansoprazole (PREVACID) 15 MG capsule Take 15 mg by mouth daily.    . Magnesium 250 MG TABS Take 1 tablet by mouth daily.    . metoprolol succinate (TOPROL-XL) 25 MG 24 hr tablet Take 0.5 tablets (12.5 mg total) by mouth daily. 30 tablet 6  . Multiple Vitamin (MULITIVITAMIN WITH MINERALS) TABS Take 1 tablet by mouth daily.    . Omega-3 Fatty Acids (FISH OIL) 1000 MG CAPS Take 1,000 mg by mouth daily. Mega Krill Fish oil    . rosuvastatin (CRESTOR) 10 MG tablet Take 10 mg by mouth 3 (three) times a week. On Monday, Wednesday, and Friday     . vitamin B-12 (CYANOCOBALAMIN) 500 MCG tablet Take 500 mcg by mouth daily.    . Vitamin Mixture (ESTER-C) 500-60 MG TABS Take 500 mg by mouth daily.    . [DISCONTINUED] Calcium Carb-Cholecalciferol (CALTRATE 600+D) 600-800 MG-UNIT TABS Take by mouth.    . [DISCONTINUED] calcium citrate-vitamin D (CITRACAL+D) 315-200 MG-UNIT per tablet Take 1 tablet by mouth 2 (two) times daily.    . [DISCONTINUED] furosemide (LASIX) 40 MG tablet Take 40 mg by mouth daily.      . [DISCONTINUED] potassium chloride 20 MEQ/15ML (10%) solution Take by mouth daily.       No current facility-administered medications for this visit.    Review of Systems : See HPI for pertinent positives and negatives.  Physical Examination  Filed Vitals:   07/29/14 1420 07/29/14 1424 07/29/14 1432 07/29/14 1440  BP: 174/90 161/87 150/80 148/90  Pulse: 79 80 81 81  Resp:  16    Height:  5\' 3"  (1.6 m)    Weight:  156 lb (70.761 kg)    SpO2:  99%     Body mass index is 27.64 kg/(m^2).   General: WDWN female in NAD  GAIT: normal  Eyes: PERRLA  Pulmonary: CTAB, no rales, rhonchi, or wheezing.  Cardiac: regular Rhythm, no murmur detected  VASCULAR EXAM  Carotid Bruits  Left  Right    Negative  Negative    Radial pulses are 2+ and equal   LE Pulses  LEFT  RIGHT   POPLITEAL  not palpable  not  palpable   POSTERIOR TIBIAL  not palpable  not palpable   DORSALIS PEDIS  ANTERIOR TIBIAL  palpable  Not palpable    Gastrointestinal: soft, nontender, BS WNL, no r/g, no  palpable masses.  Musculoskeletal: Negative muscle atrophy/wasting. M/S 5/5 throughout, Extremities without ischemic changes.  Neurologic: A&O X 3; Appropriate Affect,  Speech is normal  CN 2-12 intact except slight left side facial droop with smile, Pain and light touch intact in extremities, Motor exam as listed above.          Non-Invasive Vascular Imaging CAROTID DUPLEX 07/29/2014   CEREBROVASCULAR DUPLEX EVALUATION    INDICATION: Carotid stenosis    PREVIOUS INTERVENTION(S): Aorta to innominate bypass ,Left common carotid to subclavian artery bypass graft 12/30/2009;Right carotid endarterectomy 05/06/2011    DUPLEX EXAM:     RIGHT  LEFT  Peak Systolic Velocities (cm/s) End Diastolic Velocities (cm/s) Plaque LOCATION Peak Systolic Velocities (cm/s) End Diastolic Velocities (cm/s) Plaque  168 32  CCA PROXIMAL 104 29   115 24 HM CCA MID 80 2   61 9  CCA DISTAL 91/237 28/49 HT  69 12  ECA 149 16 HT  88 21  ICA PROXIMAL 277 73 HT  99 32  ICA MID 100 24   85 26  ICA DISTAL 79 25     carotid endarterectomy ICA / CCA Ratio (PSV) 3.5  Antegrade Vertebral Flow Antegrade  182 Brachial Systolic Pressure (mmHg) 178  Triphasic Brachial Artery Waveforms Triphasic    Plaque Morphology:  HM = Homogeneous, HT = Heterogeneous, CP = Calcific Plaque, SP = Smooth Plaque, IP = Irregular Plaque  ADDITIONAL FINDINGS: Right subclavian artery PSV200cm/sec; Left subclavian artery PSV 131cm/sec    IMPRESSION: Right internal carotid artery is patent with history of carotid endarterectomy, mild hyperplasia without hemodynamically significant changes present. Left internal carotid artery stenosis present in the 50%-69% range. Patent left common carotid artery to subclavian artery bypass graft.    Compared to the  previous exam:  Essentially unchanged since study on 01/21/2014.      Assessment: Jennifer [name] is a 74 y.o. female who is s/p aorta to innominate, left common carotid, and left subclavian arterial bypass on 12/30/2009; right carotid endarterectomy 05/06/2011. She has no history or stroke or TIA.  Today's carotid Duplex suggests a patent right ICA with history of carotid endarterectomy, mild hyperplasia without hemodynamically significant changes present,  50%-69% left ICA stenosis, and a patent left common carotid artery to subclavian artery bypass graft.   Plan: Follow-up in 6 months with Carotid Duplex.   I discussed in depth with the patient the nature of atherosclerosis, and emphasized the importance of maximal medical management including strict control of blood pressure, blood glucose, and lipid levels, obtaining regular exercise, and continued cessation of smoking.  The patient is aware that without maximal medical management the underlying atherosclerotic disease process will progress, limiting the benefit of any interventions. The patient was given information about stroke prevention and what symptoms should prompt the patient to seek immediate medical care. Thank you for allowing [place] to participate in this patient's care.  Charisse March, RN, MSN, FNP-C [facility] Office: [phone]  Clinic Physician: Myra Gianotti  07/29/2014 2:17 PM

## 2014-07-29 NOTE — Progress Notes (Signed)
Filed Vitals:   07/29/14 1420 07/29/14 1424 07/29/14 1432 07/29/14 1440  BP: 174/90 161/87 150/80 148/90  Pulse: 79 80 81 81  Resp:  16    Height:  5\' 3"  (1.6 m)    Weight:  156 lb (70.761 kg)    SpO2:  99%

## 2014-07-29 NOTE — Addendum Note (Signed)
Addended by: Adria Dill L on: 07/29/2014 04:37 PM   Modules accepted: Orders

## 2014-07-29 NOTE — Patient Instructions (Signed)
Stroke Prevention Some medical conditions and behaviors are associated with an increased chance of having a stroke. You may prevent a stroke by making healthy choices and managing medical conditions. HOW CAN I REDUCE MY RISK OF HAVING A STROKE?   Stay physically active. Get at least 30 minutes of activity on most or all days.  Do not smoke. It may also be helpful to avoid exposure to secondhand smoke.  Limit alcohol use. Moderate alcohol use is considered to be:  No more than 2 drinks per day for men.  No more than 1 drink per day for nonpregnant women.  Eat healthy foods. This involves:  Eating 5 or more servings of fruits and vegetables a day.  Making dietary changes that address high blood pressure (hypertension), high cholesterol, diabetes, or obesity.  Manage your cholesterol levels.  Making food choices that are high in fiber and low in saturated fat, trans fat, and cholesterol may control cholesterol levels.  Take any prescribed medicines to control cholesterol as directed by your health care provider.  Manage your diabetes.  Controlling your carbohydrate and sugar intake is recommended to manage diabetes.  Take any prescribed medicines to control diabetes as directed by your health care provider.  Control your hypertension.  Making food choices that are low in salt (sodium), saturated fat, trans fat, and cholesterol is recommended to manage hypertension.  Take any prescribed medicines to control hypertension as directed by your health care provider.  Maintain a healthy weight.  Reducing calorie intake and making food choices that are low in sodium, saturated fat, trans fat, and cholesterol are recommended to manage weight.  Stop drug abuse.  Avoid taking birth control pills.  Talk to your health care provider about the risks of taking birth control pills if you are over 35 years old, smoke, get migraines, or have ever had a blood clot.  Get evaluated for sleep  disorders (sleep apnea).  Talk to your health care provider about getting a sleep evaluation if you snore a lot or have excessive sleepiness.  Take medicines only as directed by your health care provider.  For some people, aspirin or blood thinners (anticoagulants) are helpful in reducing the risk of forming abnormal blood clots that can lead to stroke. If you have the irregular heart rhythm of atrial fibrillation, you should be on a blood thinner unless there is a good reason you cannot take them.  Understand all your medicine instructions.  Make sure that other conditions (such as anemia or atherosclerosis) are addressed. SEEK IMMEDIATE MEDICAL CARE IF:   You have sudden weakness or numbness of the face, arm, or leg, especially on one side of the body.  Your face or eyelid droops to one side.  You have sudden confusion.  You have trouble speaking (aphasia) or understanding.  You have sudden trouble seeing in one or both eyes.  You have sudden trouble walking.  You have dizziness.  You have a loss of balance or coordination.  You have a sudden, severe headache with no known cause.  You have new chest pain or an irregular heartbeat. Any of these symptoms may represent a serious problem that is an emergency. Do not wait to see if the symptoms will go away. Get medical help at once. Call your local emergency services (911 in [place] Do not drive yourself to the hospital. Document Released: 03/11/2004 Document Revised: 06/18/2013 Document Reviewed: 08/04/2012 Exit[employer] Patient Information 2015 [employer]. This information is not intended to replace advice given   to you by your health care provider. Make sure you discuss any questions you have with your health care provider.  

## 2014-07-31 ENCOUNTER — Other Ambulatory Visit: Payer: Self-pay | Admitting: Cardiology

## 2014-07-31 NOTE — Telephone Encounter (Signed)
Rx(s) sent to pharmacy electronically.  

## 2014-09-16 DIAGNOSIS — R350 Frequency of micturition: Secondary | ICD-10-CM | POA: Diagnosis not present

## 2014-09-16 DIAGNOSIS — R300 Dysuria: Secondary | ICD-10-CM | POA: Diagnosis not present

## 2014-09-16 DIAGNOSIS — Z6828 Body mass index (BMI) 28.0-28.9, adult: Secondary | ICD-10-CM | POA: Diagnosis not present

## 2014-11-05 ENCOUNTER — Ambulatory Visit: Payer: Medicare Other | Admitting: Cardiology

## 2014-11-05 ENCOUNTER — Ambulatory Visit (INDEPENDENT_AMBULATORY_CARE_PROVIDER_SITE_OTHER): Payer: Medicare Other | Admitting: Cardiology

## 2014-11-05 ENCOUNTER — Encounter: Payer: Self-pay | Admitting: Cardiology

## 2014-11-05 VITALS — BP 172/92 | HR 74 | Ht 63.0 in | Wt 156.9 lb

## 2014-11-05 DIAGNOSIS — I10 Essential (primary) hypertension: Secondary | ICD-10-CM

## 2014-11-05 DIAGNOSIS — Z87891 Personal history of nicotine dependence: Secondary | ICD-10-CM

## 2014-11-05 DIAGNOSIS — N181 Chronic kidney disease, stage 1: Secondary | ICD-10-CM

## 2014-11-05 DIAGNOSIS — I739 Peripheral vascular disease, unspecified: Secondary | ICD-10-CM | POA: Diagnosis not present

## 2014-11-05 DIAGNOSIS — N189 Chronic kidney disease, unspecified: Secondary | ICD-10-CM | POA: Diagnosis not present

## 2014-11-05 MED ORDER — AMLODIPINE BESYLATE 10 MG PO TABS: 10.0000 mg | ORAL_TABLET | Freq: Every day | ORAL | Status: DC

## 2014-11-05 NOTE — Assessment & Plan Note (Signed)
Followed by PCP, "stable" per pt

## 2014-11-05 NOTE — Progress Notes (Signed)
11/05/2014 Rodolph Bong   Aug 23, 1940  [id number]  Primary Physician Ailene Ravel, MD Primary Cardiologist: Dr Allyson Sabal  HPI:  74 year old mildly overweight married Caucasian female mother of 2 children, grandmother and one grandchild was previously a patient of Dr. Algie Coffer. She is a retired Museum/gallery exhibitions officer for [employer] and her niece, [name], is an Charity fundraiser at the surgical center. The pt has a history of tobacco use, having quit in November of 2011 after 25 pack years. She has treated hypertension, mild CRI,  and dyslipidemia. She has a history of left carotid to subclavian artery bypass grafting by Dr. Myra Gianotti prior to coronary artery bypass grafting x4 in Nov 2011 by Dr Rexanne Mano. She then had right carotid endarterectomy performed by Dr. Myra Gianotti March 2013. She is here for 6 month check up. I increased her Norvasc at [facility] for HTN. She says her B/P is always up at the doctor's office. At home she tells me her B/P is "138". She denies any chest pain or dyspnea. She is active, mows her own yard with a push mower.    Current Outpatient Prescriptions  Medication Sig Dispense Refill  . ALPHA LIPOIC ACID PO Take 250 mg by mouth daily.    Marland Kitchen aspirin EC 81 MG tablet Take 81 mg by mouth every other day.    . cholecalciferol (VITAMIN D) 1000 UNITS tablet Take 1,000 Units by mouth 3 (three) times a week.    . co-enzyme Q-10 50 MG capsule Take 100 mg by mouth daily.    . Flaxseed, Linseed, 1000 MG CAPS Take 1 capsule by mouth daily.     . Garlic (GARLIQUE PO) Take 600 mg by mouth daily.     . lansoprazole (PREVACID) 15 MG capsule Take 15 mg by mouth. For 3 days    . Magnesium 250 MG TABS Take 1 tablet by mouth daily.    . metoprolol succinate (TOPROL-XL) 25 MG 24 hr tablet Take 0.5 tablets (12.5 mg total) by mouth daily. 15 tablet 9  . Multiple Vitamin (MULITIVITAMIN WITH MINERALS) TABS Take 1 tablet by mouth daily.    . Omega-3 Fatty Acids (FISH OIL) 1000 MG CAPS Take 1,000 mg by mouth daily.  Mega Krill Fish oil    . rosuvastatin (CRESTOR) 10 MG tablet Take 10 mg by mouth 3 (three) times a week. On Monday, Wednesday, and Friday     . vitamin B-12 (CYANOCOBALAMIN) 500 MCG tablet Take 500 mcg by mouth daily.    . Vitamin Mixture (ESTER-C) 500-60 MG TABS Take 500 mg by mouth daily.    Marland Kitchen amLODipine (NORVASC) 10 MG tablet Take 1 tablet (10 mg total) by mouth daily. 30 tablet 6  . [DISCONTINUED] Calcium Carb-Cholecalciferol (CALTRATE 600+D) 600-800 MG-UNIT TABS Take by mouth.    . [DISCONTINUED] calcium citrate-vitamin D (CITRACAL+D) 315-200 MG-UNIT per tablet Take 1 tablet by mouth 2 (two) times daily.    . [DISCONTINUED] furosemide (LASIX) 40 MG tablet Take 40 mg by mouth daily.      . [DISCONTINUED] potassium chloride 20 MEQ/15ML (10%) solution Take by mouth daily.       No current facility-administered medications for this visit.    Allergies  Allergen Reactions  . Statins Anaphylaxis    High dose ( Crestor 20 mg , pt cannot take)  . Penicillins Hives and Rash    Social History   Social History  . Marital Status: Married    Spouse Name: N/A  . Number of Children: N/A  .  Years of Education: N/A   Occupational History  . Not on file.   Social History Main Topics  . Smoking status: Former Smoker    Quit date: 12/16/2009  . Smokeless tobacco: Never Used  . Alcohol Use: No     Comment: VERY LITTLE  . Drug Use: No  . Sexual Activity: Not on file   Other Topics Concern  . Not on file   Social History Narrative     Review of Systems: General: negative for chills, fever, night sweats or weight changes.  Cardiovascular: negative for chest pain, dyspnea on exertion, edema, orthopnea, palpitations, paroxysmal nocturnal dyspnea or shortness of breath Dermatological: negative for rash Respiratory: negative for cough or wheezing Urologic: negative for hematuria Abdominal: negative for nausea, vomiting, diarrhea, bright red blood per rectum, melena, or  hematemesis Neurologic: negative for visual changes, syncope, or dizziness All other systems reviewed and are otherwise negative except as noted above.    Blood pressure 172/92, pulse 74, height  (1.6 m), weight 156 lb 14.4 oz (71.169 kg).  General appearance: alert, cooperative and no distress Neck: no JVD and RCE scar, Lt CA bruit Lungs: clear to auscultation bilaterally Heart: regular rate and rhythm Extremities: edema Pulses: 2+ and symmetric Skin: Skin color, texture, turgor normal. 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 I do not feel it is adequately controlled. She is reluctant to add any new medication or increase what she takes. I suggested HCTZ but she says she took this one time and felt "funny" after only one dose.  I did get her to agree to try increasing her Norvasc to 10 mg daily. She'll contact us in a week or two and let us know her B/P. If she can't tolerate that dose I would consider adding an ACE but  She would need close f/u of her renal function.   Corine Shelter K PA-C 11/05/2014 12:18 PM

## 2014-11-05 NOTE — Assessment & Plan Note (Signed)
No angina 

## 2014-11-05 NOTE — Assessment & Plan Note (Signed)
Followed at Dr Estanislado Spire office

## 2014-11-05 NOTE — Assessment & Plan Note (Signed)
Followed by her PCP. 

## 2014-11-05 NOTE — Patient Instructions (Signed)
Increase Norvasc to 10 mg daily   Schedule Blood Pressure check in 2 weeks results to be sent to Corine Shelter PA   Your physician wants you to follow-up with Dr.Berry in 6 months. You will receive a reminder letter in the mail two months in advance. If you don't receive a letter, please call our office to schedule the follow-up appointment.

## 2014-11-05 NOTE — Assessment & Plan Note (Signed)
Uncontrolled B/P- repeat 176/92 in Rt arm

## 2014-11-19 DIAGNOSIS — Z6829 Body mass index (BMI) 29.0-29.9, adult: Secondary | ICD-10-CM | POA: Diagnosis not present

## 2014-11-19 DIAGNOSIS — I10 Essential (primary) hypertension: Secondary | ICD-10-CM | POA: Diagnosis not present

## 2014-12-23 DIAGNOSIS — H25041 Posterior subcapsular polar age-related cataract, right eye: Secondary | ICD-10-CM | POA: Diagnosis not present

## 2014-12-23 DIAGNOSIS — H40063 Primary angle closure without glaucoma damage, bilateral: Secondary | ICD-10-CM | POA: Diagnosis not present

## 2014-12-23 DIAGNOSIS — H25011 Cortical age-related cataract, right eye: Secondary | ICD-10-CM | POA: Diagnosis not present

## 2014-12-23 DIAGNOSIS — H2511 Age-related nuclear cataract, right eye: Secondary | ICD-10-CM | POA: Diagnosis not present

## 2014-12-23 DIAGNOSIS — H40069 Primary angle closure without glaucoma damage, unspecified eye: Secondary | ICD-10-CM | POA: Diagnosis not present

## 2015-01-13 DIAGNOSIS — J019 Acute sinusitis, unspecified: Secondary | ICD-10-CM | POA: Diagnosis not present

## 2015-01-13 DIAGNOSIS — Z6828 Body mass index (BMI) 28.0-28.9, adult: Secondary | ICD-10-CM | POA: Diagnosis not present

## 2015-01-21 DIAGNOSIS — I10 Essential (primary) hypertension: Secondary | ICD-10-CM | POA: Diagnosis not present

## 2015-01-21 DIAGNOSIS — N183 Chronic kidney disease, stage 3 (moderate): Secondary | ICD-10-CM | POA: Diagnosis not present

## 2015-01-21 DIAGNOSIS — Z6828 Body mass index (BMI) 28.0-28.9, adult: Secondary | ICD-10-CM | POA: Diagnosis not present

## 2015-01-21 DIAGNOSIS — E7800 Pure hypercholesterolemia, unspecified: Secondary | ICD-10-CM | POA: Diagnosis not present

## 2015-01-21 DIAGNOSIS — R7303 Prediabetes: Secondary | ICD-10-CM | POA: Diagnosis not present

## 2015-02-06 ENCOUNTER — Encounter (HOSPITAL_COMMUNITY): Payer: Medicare Other

## 2015-02-06 ENCOUNTER — Ambulatory Visit: Payer: Medicare Other | Admitting: Family

## 2015-03-20 ENCOUNTER — Encounter (HOSPITAL_COMMUNITY): Payer: Medicare Other

## 2015-03-20 ENCOUNTER — Ambulatory Visit: Payer: Medicare Other | Admitting: Family

## 2015-03-21 ENCOUNTER — Encounter: Payer: Self-pay | Admitting: Family

## 2015-03-27 ENCOUNTER — Ambulatory Visit (INDEPENDENT_AMBULATORY_CARE_PROVIDER_SITE_OTHER): Payer: Medicare Other | Admitting: Family

## 2015-03-27 ENCOUNTER — Encounter: Payer: Self-pay | Admitting: Family

## 2015-03-27 ENCOUNTER — Ambulatory Visit (HOSPITAL_COMMUNITY)
Admission: RE | Admit: 2015-03-27 | Discharge: 2015-03-27 | Disposition: A | Discharge status: Home / Self Care | Payer: Medicare Other | Source: Ambulatory Visit | Attending: Family | Admitting: Family

## 2015-03-27 VITALS — BP 141/86 | HR 79 | Temp 97.2°F | Resp 16 | Ht 63.0 in | Wt 159.0 lb

## 2015-03-27 DIAGNOSIS — I6523 Occlusion and stenosis of bilateral carotid arteries: Secondary | ICD-10-CM

## 2015-03-27 DIAGNOSIS — Z48812 Encounter for surgical aftercare following surgery on the circulatory system: Secondary | ICD-10-CM

## 2015-03-27 DIAGNOSIS — Z87891 Personal history of nicotine dependence: Secondary | ICD-10-CM

## 2015-03-27 DIAGNOSIS — I10 Essential (primary) hypertension: Secondary | ICD-10-CM | POA: Insufficient documentation

## 2015-03-27 DIAGNOSIS — Z9889 Other specified postprocedural states: Secondary | ICD-10-CM | POA: Diagnosis not present

## 2015-03-27 DIAGNOSIS — E7800 Pure hypercholesterolemia, unspecified: Secondary | ICD-10-CM | POA: Diagnosis not present

## 2015-03-27 DIAGNOSIS — I6522 Occlusion and stenosis of left carotid artery: Secondary | ICD-10-CM | POA: Diagnosis not present

## 2015-03-27 NOTE — Patient Instructions (Signed)
Stroke Prevention Some medical conditions and behaviors are associated with an increased chance of having a stroke. You may prevent a stroke by making healthy choices and managing medical conditions. HOW CAN I REDUCE MY RISK OF HAVING A STROKE?   Stay physically active. Get at least 30 minutes of activity on most or all days.  Do not smoke. It may also be helpful to avoid exposure to secondhand smoke.  Limit alcohol use. Moderate alcohol use is considered to be:  No more than 2 drinks per day for men.  No more than 1 drink per day for nonpregnant women.  Eat healthy foods. This involves:  Eating 5 or more servings of fruits and vegetables a day.  Making dietary changes that address high blood pressure (hypertension), high cholesterol, diabetes, or obesity.  Manage your cholesterol levels.  Making food choices that are high in fiber and low in saturated fat, trans fat, and cholesterol may control cholesterol levels.  Take any prescribed medicines to control cholesterol as directed by your health care provider.  Manage your diabetes.  Controlling your carbohydrate and sugar intake is recommended to manage diabetes.  Take any prescribed medicines to control diabetes as directed by your health care provider.  Control your hypertension.  Making food choices that are low in salt (sodium), saturated fat, trans fat, and cholesterol is recommended to manage hypertension.  Ask your health care provider if you need treatment to lower your blood pressure. Take any prescribed medicines to control hypertension as directed by your health care provider.  If you are 18-39 years of age, have your blood pressure checked every 3-5 years. If you are 40 years of age or older, have your blood pressure checked every year.  Maintain a healthy weight.  Reducing calorie intake and making food choices that are low in sodium, saturated fat, trans fat, and cholesterol are recommended to manage  weight.  Stop drug abuse.  Avoid taking birth control pills.  Talk to your health care provider about the risks of taking birth control pills if you are over 35 years old, smoke, get migraines, or have ever had a blood clot.  Get evaluated for sleep disorders (sleep apnea).  Talk to your health care provider about getting a sleep evaluation if you snore a lot or have excessive sleepiness.  Take medicines only as directed by your health care provider.  For some people, aspirin or blood thinners (anticoagulants) are helpful in reducing the risk of forming abnormal blood clots that can lead to stroke. If you have the irregular heart rhythm of atrial fibrillation, you should be on a blood thinner unless there is a good reason you cannot take them.  Understand all your medicine instructions.  Make sure that other conditions (such as anemia or atherosclerosis) are addressed. SEEK IMMEDIATE MEDICAL CARE IF:   You have sudden weakness or numbness of the face, arm, or leg, especially on one side of the body.  Your face or eyelid droops to one side.  You have sudden confusion.  You have trouble speaking (aphasia) or understanding.  You have sudden trouble seeing in one or both eyes.  You have sudden trouble walking.  You have dizziness.  You have a loss of balance or coordination.  You have a sudden, severe headache with no known cause.  You have new chest pain or an irregular heartbeat. Any of these symptoms may represent a serious problem that is an emergency. Do not wait to see if the symptoms will   go away. Get medical help at once. Call your local emergency services (911 in [place] Do not drive yourself to the hospital.   This information is not intended to replace advice given to you by your health care provider. Make sure you discuss any questions you have with your health care provider.   Document Released: 03/11/2004 Document Revised: 02/22/2014 Document Reviewed:  08/04/2012 Elsevier Interactive Patient Education 2016 [employer]  

## 2015-03-27 NOTE — Progress Notes (Signed)
Chief Complaint: Extracranial Carotid Artery Stenosis   History of Present Illness  Jennifer [name] is a 75 y.o. female patient of Dr. Myra Gianotti who is status post ascending aorta innominate left CCA and left subclavian artery bypass graft in November 2011, and a right carotid endarterectomy in March 2013. She returns today for follow up. She denies tingling, numbness, weakness, or pain in either hand/arm, attributes transient dizziness to amlodipine, about 30 minutes after taking it, denies any other dizziness issues. Patient reports headaches and right arm weakness with 20 mg Crestor qd which was stopped, mild myalgias on 10 mg Crestor 3x/week currently.  Pt. states blood pressure usually runs 134/76 or less, states her blood pressure goes up in Dr. offices.  Is walking twice/week, 2 miles.   Had 4 vessel CABG 2011, denies MI.  Patient has Negative history of TIA or stroke symptom. The patient denies amaurosis fugax or monocular blindness. The patient denies facial drooping.  Pt. denies hemiplegia. The patient denies receptive or expressive aphasia.  Pt denies claudication symptoms in legs with walking. Patient denies New Medical or Surgical History.   Pt Diabetic: No  Pt smoker: former smoker, quit in 2011 Pt meds include:  Statin : Yes, cannot tolerate high dose statins, had muscle spasms, is taking 10 mg Crestor 3x/week Betablocker: Yes  ASA: Yes  Other anticoagulants/antiplatelets: no     Past Medical History  Diagnosis Date  . Joint pain   . Eyesight diminished Change in eyesight  . Shortness of breath on exertion   . Hypertension   . Hypercholesterolemia   . Coronary artery disease Oct. 2011  . Carotid artery occlusion   . Anemia   . Blood transfusion     1965  . GERD (gastroesophageal reflux disease)   . No pertinent past medical history     HX KIDNEY STONES  . Headache(784.0)   . Arthritis     Social History Social History  Substance Use Topics  .  Smoking status: Former Smoker    Quit date: 12/16/2009  . Smokeless tobacco: Never Used  . Alcohol Use: No     Comment: VERY LITTLE    Family History Family History  Problem Relation Age of Onset  . Diabetes Mother     Late onset  . Hyperlipidemia Mother   . Hypertension Mother   . Heart attack Mother   . Diabetes Sister     Late onset  . Hyperlipidemia Sister   . Hypertension Sister     AAA  . Cancer Brother     Prostate  . Heart disease Brother   . Hypertension Brother   . Cancer Brother     Prostate     Surgical History Past Surgical History  Procedure Laterality Date  . Carotid artery - subclavian artery bypass graft    . Coronary artery bypass graft      12/2009    . Cardiac catheterization      2011  DR [facility]  . No past surgeries    . Cardiovascular stress test      2012 DR [facility] ALSO ECHO    . Eye surgery      LASER BIL X2 FOR GLAUCOMA  . Abdominal hysterectomy    . Cesarean section      X2   . Breast biopsy      BILATERAL  BENIGN   . Endarterectomy  05/06/2011    Procedure: ENDARTERECTOMY CAROTID;  Surgeon: Nada Libman, MD;  Location: [facility] OR;  Service: Vascular;  Laterality: Right;  right carotid artery endarterctomy with vascu guard patch angioplasty   . Carotid endarterectomy  05/06/11    Right CEA  . Pr vein bypass graft,aorto-fem-pop      Allergies  Allergen Reactions  . Statins Anaphylaxis    High dose ( Crestor 20 mg , pt cannot take)  . Penicillins Hives and Rash    Current Outpatient Prescriptions  Medication Sig Dispense Refill  . ALPHA LIPOIC ACID PO Take 250 mg by mouth daily.    Marland Kitchen amLODipine (NORVASC) 10 MG tablet Take 1 tablet (10 mg total) by mouth daily. 30 tablet 6  . aspirin EC 81 MG tablet Take 81 mg by mouth every other day.    . cholecalciferol (VITAMIN D) 1000 UNITS tablet Take 1,000 Units by mouth 3 (three) times a week.    . co-enzyme Q-10 50 MG capsule Take 100 mg by mouth daily.    . Flaxseed, Linseed, 1000  MG CAPS Take 1 capsule by mouth daily.     . Garlic (GARLIQUE PO) Take 600 mg by mouth daily.     . lansoprazole (PREVACID) 15 MG capsule Take 15 mg by mouth. For 3 days    . Magnesium 250 MG TABS Take 1 tablet by mouth daily.    . metoprolol succinate (TOPROL-XL) 25 MG 24 hr tablet Take 0.5 tablets (12.5 mg total) by mouth daily. 15 tablet 9  . Multiple Vitamin (MULITIVITAMIN WITH MINERALS) TABS Take 1 tablet by mouth daily.    . Omega-3 Fatty Acids (FISH OIL) 1000 MG CAPS Take 1,000 mg by mouth daily. Mega Krill Fish oil    . rosuvastatin (CRESTOR) 10 MG tablet Take 10 mg by mouth 3 (three) times a week. On Monday, Wednesday, and Friday     . vitamin B-12 (CYANOCOBALAMIN) 500 MCG tablet Take 500 mcg by mouth daily.    . Vitamin Mixture (ESTER-C) 500-60 MG TABS Take 500 mg by mouth daily.    . [DISCONTINUED] Calcium Carb-Cholecalciferol (CALTRATE 600+D) 600-800 MG-UNIT TABS Take by mouth.    . [DISCONTINUED] calcium citrate-vitamin D (CITRACAL+D) 315-200 MG-UNIT per tablet Take 1 tablet by mouth 2 (two) times daily.    . [DISCONTINUED] furosemide (LASIX) 40 MG tablet Take 40 mg by mouth daily.      . [DISCONTINUED] potassium chloride 20 MEQ/15ML (10%) solution Take by mouth daily.       No current facility-administered medications for this visit.    Review of Systems : See HPI for pertinent positives and negatives.  Physical Examination  Filed Vitals:   03/27/15 0934 03/27/15 0937 03/27/15 0941 03/27/15 0942  BP: 143/86 150/88 130/82 141/86  Pulse: 81 80 80 79  Temp:  97.2 F (36.2 C)    TempSrc:  Oral    Resp:  16    Height:  5\' 3"  (1.6 m)    Weight:  159 lb (72.122 kg)    SpO2:  98%     Body mass index is 28.17 kg/(m^2).   General: WDWN female in NAD  GAIT: normal  Eyes: PERRLA  Pulmonary: CTAB, no rales, rhonchi, or wheezing.  Cardiac: regular Rhythm, no murmur detected  VASCULAR EXAM  Carotid Bruits  Left  Right    Negative  Negative    Radial  pulses are 2+ and equal   LE Pulses  LEFT  RIGHT   POPLITEAL  not palpable  not palpable   POSTERIOR TIBIAL  not palpable  not  palpable   DORSALIS PEDIS  ANTERIOR TIBIAL  palpable  Not palpable    Gastrointestinal: soft, nontender, BS WNL, no r/g, no palpable masses.  Musculoskeletal: No muscle atrophy/wasting. M/S 5/5 throughout, Extremities without ischemic changes.  Neurologic: A&O X 3; Appropriate Affect,  Speech is normal  CN 2-12 intact except slight left side facial droop with smile, Pain and light touch intact in extremities, Motor exam as listed above.                Non-Invasive Vascular Imaging CAROTID DUPLEX 03/27/2015   CEREBROVASCULAR DUPLEX EVALUATION    INDICATION: Carotid stenosis    PREVIOUS INTERVENTION(S): Aorta to innominate bypass ,Left common carotid to subclavian artery bypass graft 12/30/2009;Right carotid endarterectomy 05/06/2011    DUPLEX EXAM:     RIGHT  LEFT  Peak Systolic Velocities (cm/s) End Diastolic Velocities (cm/s) Plaque LOCATION Peak Systolic Velocities (cm/s) End Diastolic Velocities (cm/s) Plaque  112 33  CCA PROXIMAL 59 18   84 14 HM CCA MID 63 22   148 29  CCA DISTAL 67 24 HT  105 21  ECA 202 89 HT  88 22  ICA PROXIMAL 267 78 HT  98 32  ICA MID 73 18   79 22  ICA DISTAL 61 20     carotid endarterectomy ICA / CCA Ratio (PSV) 3.9  Antegrade Vertebral Flow Antegrade   Brachial Systolic Pressure (mmHg)   Triphasic Brachial Artery Waveforms Triphasic    Plaque Morphology:  HM = Homogeneous, HT = Heterogeneous, CP = Calcific Plaque, SP = Smooth Plaque, IP = Irregular Plaque     ADDITIONAL FINDINGS: Mild plaque noted in the right proximal subclavian with no loss of triphasicity or increased PSV ratio.    IMPRESSION: Right internal carotid artery is patent with history of carotid endarterectomy, mild hyperplasia without hemodynamically significant changes present. Left internal carotid artery  stenosis present in the 60-79% range. Patent left common carotid artery to subclavian artery bypass graft.    Compared to the previous exam:  Essentially unchanged since study on 07/29/2014.      Assessment: Jennifer [name] is a 75 y.o. female who is s/p aorta to innominate, left common carotid, and left subclavian arterial bypass on 12/30/2009; right carotid endarterectomy 05/06/2011. She has no history or stroke or TIA.  Today's carotid Duplex suggests    Plan: Follow-up in 6 months with Carotid Duplex scan.   I discussed in depth with the patient the nature of atherosclerosis, and emphasized the importance of maximal medical management including strict control of blood pressure, blood glucose, and lipid levels, obtaining regular exercise, and continued cessation of smoking.  The patient is aware that without maximal medical management the underlying atherosclerotic disease process will progress, limiting the benefit of any interventions. The patient was given information about stroke prevention and what symptoms should prompt the patient to seek immediate medical care. Thank you for allowing [place] to participate in this patient's care.  Charisse March, RN, MSN, FNP-C [facility] Office: [phone]  Clinic Physician: Darrick Penna  03/27/2015 9:36 AM

## 2015-03-27 NOTE — Addendum Note (Signed)
Addended by: Adria Dill L on: 03/27/2015 05:01 PM   Modules accepted: Orders

## 2015-03-27 NOTE — Progress Notes (Signed)
Filed Vitals:   03/27/15 0934 03/27/15 0937 03/27/15 0941 03/27/15 0942  BP: 143/86 150/88 130/82 141/86  Pulse: 81 80 80 79  Temp:  97.2 F (36.2 C)    TempSrc:  Oral    Resp:  16    Height:   (1.6 m)    Weight:  159 lb (72.122 kg)    SpO2:  98%

## 2015-05-13 ENCOUNTER — Ambulatory Visit (INDEPENDENT_AMBULATORY_CARE_PROVIDER_SITE_OTHER): Payer: Medicare Other | Admitting: Cardiovascular Disease

## 2015-05-13 ENCOUNTER — Encounter: Payer: Self-pay | Admitting: Cardiovascular Disease

## 2015-05-13 VITALS — BP 170/89 | HR 88 | Ht 63.0 in | Wt 159.8 lb

## 2015-05-13 DIAGNOSIS — I739 Peripheral vascular disease, unspecified: Secondary | ICD-10-CM | POA: Diagnosis not present

## 2015-05-13 DIAGNOSIS — I2510 Atherosclerotic heart disease of native coronary artery without angina pectoris: Secondary | ICD-10-CM | POA: Diagnosis not present

## 2015-05-13 DIAGNOSIS — I10 Essential (primary) hypertension: Secondary | ICD-10-CM

## 2015-05-13 DIAGNOSIS — I2583 Coronary atherosclerosis due to lipid rich plaque: Secondary | ICD-10-CM

## 2015-05-13 DIAGNOSIS — E785 Hyperlipidemia, unspecified: Secondary | ICD-10-CM | POA: Diagnosis not present

## 2015-05-13 NOTE — Assessment & Plan Note (Signed)
History of coronary artery disease status post coronary artery bypass grafting X 4 by Dr. Rexanne ManoBrian Bartle 12/28/09. She denies chest pain or shortness of breath.

## 2015-05-13 NOTE — Assessment & Plan Note (Signed)
History of hyperlipidemia on statin therapy followed by her PCP. 

## 2015-05-13 NOTE — Assessment & Plan Note (Signed)
History of hypertension with blood pressure measured today at 170/89. She does say she has "white coat hypertension and her blood pressure measured this morning at home was in the 130/80 range. She is on amlodipine, and metoprolol. Continue current meds at current dosing

## 2015-05-13 NOTE — Assessment & Plan Note (Signed)
History of carotid artery disease status post bilateral carotid endarterectomy with left carotid to subclavian bypass followed by Dr. Myra GianottiBrabham .

## 2015-05-13 NOTE — Progress Notes (Signed)
05/13/2015 Jennifer [name]   09/25/1940  [id number]  Primary Physician Ailene Ravel, MD Primary Cardiologist: Runell Gess MD Roseanne Reno   HPI:  Jennifer [name] is a 75 year old mildly overweight married Caucasian female mother of 2 children, grandmother and one grandchild was formally a patient of Dr. Algie Coffer.I last saw her in the office 05/07/14. 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 Risk factors include discontinue tobacco use having quit in November of 2011 and smoked 25 pack years. She has treated hypertension and dyslipidemia. She has a history of left carotid to subclavian artery bypass grafting by Dr. Myra Gianotti as post coronary artery bypass grafting x411/13/11 by Dr. Rexanne Mano. She's also had a lack of right carotid endarterectomy performed by Dr. Myra Gianotti 3/13 He follows her as an outpatient noninvasively. She denies chest pain or shortness of breath, or claudication. She saw Corine Shelter in the office 11/05/14 who adjusted her blood pressure medicines. Since that time she's been asymptomatic denying chest pain or shortness of breath.   Current Outpatient Prescriptions  Medication Sig Dispense Refill  . ALPHA LIPOIC ACID PO Take 250 mg by mouth daily.    Marland Kitchen amLODipine (NORVASC) 10 MG tablet Take 1 tablet (10 mg total) by mouth daily. 30 tablet 6  . aspirin EC 81 MG tablet Take 81 mg by mouth every other day.    . cholecalciferol (VITAMIN D) 1000 UNITS tablet Take 1,000 Units by mouth 3 (three) times a week.    . co-enzyme Q-10 50 MG capsule Take 100 mg by mouth daily.    . Flaxseed, Linseed, 1000 MG CAPS Take 1 capsule by mouth daily.     . Garlic (GARLIQUE PO) Take 600 mg by mouth daily.     . Magnesium 250 MG TABS Take 1 tablet by mouth daily.    . metoprolol succinate (TOPROL-XL) 25 MG 24 hr tablet Take 0.5 tablets (12.5 mg total) by mouth daily. 15 tablet 9  . Multiple Vitamin (MULITIVITAMIN WITH MINERALS) TABS Take 1 tablet by mouth daily.    . Omega-3  Fatty Acids (FISH OIL) 1000 MG CAPS Take 1,000 mg by mouth daily. Mega Krill Fish oil    . rosuvastatin (CRESTOR) 10 MG tablet Take 10 mg by mouth 3 (three) times a week. On Monday, Wednesday, and Friday     . vitamin B-12 (CYANOCOBALAMIN) 500 MCG tablet Take 500 mcg by mouth daily.    . Vitamin Mixture (ESTER-C) 500-60 MG TABS Take 500 mg by mouth daily.    . lansoprazole (PREVACID) 15 MG capsule Take 15 mg by mouth. Reported on 05/13/2015    . [DISCONTINUED] Calcium Carb-Cholecalciferol (CALTRATE 600+D) 600-800 MG-UNIT TABS Take by mouth.    . [DISCONTINUED] calcium citrate-vitamin D (CITRACAL+D) 315-200 MG-UNIT per tablet Take 1 tablet by mouth 2 (two) times daily.    . [DISCONTINUED] furosemide (LASIX) 40 MG tablet Take 40 mg by mouth daily.      . [DISCONTINUED] potassium chloride 20 MEQ/15ML (10%) solution Take by mouth daily.       No current facility-administered medications for this visit.    Allergies  Allergen Reactions  . Statins Anaphylaxis    High dose ( Crestor 20 mg , pt cannot take)  . Penicillins Hives and Rash    Social History   Social History  . Marital Status: Married    Spouse Name: N/A  . Number of Children: N/A  . Years of Education: N/A   Occupational History  .  Not on file.   Social History Main Topics  . Smoking status: Former Smoker    Quit date: 12/16/2009  . Smokeless tobacco: Never Used  . Alcohol Use: No     Comment: VERY LITTLE  . Drug Use: No  . Sexual Activity: Not on file   Other Topics Concern  . Not on file   Social History Narrative     Review of Systems: General: negative for chills, fever, night sweats or weight changes.  Cardiovascular: negative for chest pain, dyspnea on exertion, edema, orthopnea, palpitations, paroxysmal nocturnal dyspnea or shortness of breath Dermatological: negative for rash Respiratory: negative for cough or wheezing Urologic: negative for hematuria Abdominal: negative for nausea, vomiting, diarrhea,  bright red blood per rectum, melena, or hematemesis Neurologic: negative for visual changes, syncope, or dizziness All other systems reviewed and are otherwise negative except as noted above.    Blood pressure 170/89, pulse 88, height 5\' 3"  (1.6 m), weight 159 lb 12.8 oz (72.485 kg).  General appearance: alert and no distress Neck: no adenopathy, no JVD, supple, symmetrical, trachea midline, thyroid not enlarged, symmetric, no tenderness/mass/nodules and soft right carotid bruit Lungs: clear to auscultation bilaterally Heart: regular rate and rhythm, S1, S2 normal, no murmur, click, rub or gallop Extremities: extremities normal, atraumatic, no cyanosis or edema  EKG normal sinus rhythm 86 with nonspecific ST and T-wave changes. I personally reviewed this EKG  ASSESSMENT AND PLAN:   CABG x 4 2011 History of coronary artery disease status post coronary artery bypass grafting X 4 by Dr. Rexanne ManoBrian Bartle 12/28/09. She denies chest pain or shortness of breath.  Essential hypertension History of hypertension with blood pressure measured today at 170/89. She does say she has "white coat hypertension and her blood pressure measured this morning at home was in the 130/80 range. She is on amlodipine, and metoprolol. Continue current meds at current dosing  Hyperlipidemia History of hyperlipidemia on statin therapy followed by her PCP  PVD- Lt CCA/Lt SCA BPG Nov 2011 and RCEA 2013 with residual moderate LICA disase History of carotid artery disease status post bilateral carotid endarterectomy with left carotid to subclavian bypass followed by Dr. Myra GianottiBrabham .      Runell GessJonathan J. Yadhira Mckneely MD [facility]sptlFACP,FACC,FAHA, [facility] RehabilitationFSCAI 05/13/2015 11:33 AM

## 2015-05-13 NOTE — Patient Instructions (Signed)
Medication Instructions:  Your physician recommends that you continue on your current medications as directed. Please refer to the Current Medication list given to you today.   Labwork: I will get your lab work from your Primary Care Physician.   Testing/Procedures: NONE  Follow-Up: We request that you follow-up in: 6 MONTHS WITH LUKE KILROY with an extender and in 12 MONTHS with Dr San MorelleBerry  You will receive a reminder letter in the mail two months in advance. If you don't receive a letter, please call our office to schedule the follow-up appointment.    Any Other Special Instructions Will Be Listed Below (If Applicable).     If you need a refill on your cardiac medications before your next appointment, please call your pharmacy.

## 2015-05-27 ENCOUNTER — Other Ambulatory Visit: Payer: Self-pay | Admitting: Cardiovascular Disease

## 2015-05-27 NOTE — Telephone Encounter (Signed)
REFILL 

## 2015-06-14 ENCOUNTER — Other Ambulatory Visit: Payer: Self-pay | Admitting: Cardiology

## 2015-06-16 NOTE — Telephone Encounter (Signed)
Rx(s) sent to pharmacy electronically.  

## 2015-08-12 DIAGNOSIS — Z9181 History of falling: Secondary | ICD-10-CM | POA: Diagnosis not present

## 2015-08-12 DIAGNOSIS — I10 Essential (primary) hypertension: Secondary | ICD-10-CM | POA: Diagnosis not present

## 2015-08-12 DIAGNOSIS — R7303 Prediabetes: Secondary | ICD-10-CM | POA: Diagnosis not present

## 2015-08-12 DIAGNOSIS — E7800 Pure hypercholesterolemia, unspecified: Secondary | ICD-10-CM | POA: Diagnosis not present

## 2015-08-12 DIAGNOSIS — Z1389 Encounter for screening for other disorder: Secondary | ICD-10-CM | POA: Diagnosis not present

## 2015-08-12 DIAGNOSIS — N183 Chronic kidney disease, stage 3 (moderate): Secondary | ICD-10-CM | POA: Diagnosis not present

## 2015-09-15 DIAGNOSIS — J019 Acute sinusitis, unspecified: Secondary | ICD-10-CM | POA: Diagnosis not present

## 2015-09-15 DIAGNOSIS — N390 Urinary tract infection, site not specified: Secondary | ICD-10-CM | POA: Diagnosis not present

## 2015-09-18 ENCOUNTER — Encounter: Payer: Self-pay | Admitting: Family

## 2015-09-25 ENCOUNTER — Ambulatory Visit (INDEPENDENT_AMBULATORY_CARE_PROVIDER_SITE_OTHER): Payer: Medicare Other | Admitting: Family

## 2015-09-25 ENCOUNTER — Ambulatory Visit (HOSPITAL_COMMUNITY)
Admission: RE | Admit: 2015-09-25 | Discharge: 2015-09-25 | Disposition: A | Discharge status: Home / Self Care | Payer: Medicare Other | Source: Ambulatory Visit | Attending: Family | Admitting: Family

## 2015-09-25 ENCOUNTER — Encounter: Payer: Self-pay | Admitting: Family

## 2015-09-25 VITALS — BP 178/88 | HR 86 | Temp 97.8°F | Resp 16 | Ht 63.0 in | Wt 155.0 lb

## 2015-09-25 DIAGNOSIS — Z48812 Encounter for surgical aftercare following surgery on the circulatory system: Secondary | ICD-10-CM

## 2015-09-25 DIAGNOSIS — I10 Essential (primary) hypertension: Secondary | ICD-10-CM | POA: Diagnosis not present

## 2015-09-25 DIAGNOSIS — I6523 Occlusion and stenosis of bilateral carotid arteries: Secondary | ICD-10-CM | POA: Diagnosis not present

## 2015-09-25 DIAGNOSIS — E7800 Pure hypercholesterolemia, unspecified: Secondary | ICD-10-CM | POA: Diagnosis not present

## 2015-09-25 DIAGNOSIS — I6522 Occlusion and stenosis of left carotid artery: Secondary | ICD-10-CM | POA: Insufficient documentation

## 2015-09-25 DIAGNOSIS — Z87891 Personal history of nicotine dependence: Secondary | ICD-10-CM | POA: Insufficient documentation

## 2015-09-25 DIAGNOSIS — K219 Gastro-esophageal reflux disease without esophagitis: Secondary | ICD-10-CM | POA: Diagnosis not present

## 2015-09-25 DIAGNOSIS — Z9889 Other specified postprocedural states: Secondary | ICD-10-CM | POA: Diagnosis not present

## 2015-09-25 DIAGNOSIS — I2510 Atherosclerotic heart disease of native coronary artery without angina pectoris: Secondary | ICD-10-CM | POA: Diagnosis not present

## 2015-09-25 LAB — VAS US CAROTID
LCCAPDIAS: 28 cm/s
LEFT ECA DIAS: 49 cm/s
LEFT VERTEBRAL DIAS: -16 cm/s
LICADDIAS: -19 cm/s
LICAPSYS: -247 cm/s
Left CCA dist dias: -34 cm/s
Left CCA dist sys: -112 cm/s
Left CCA prox sys: 102 cm/s
Left ICA dist sys: -70 cm/s
Left ICA prox dias: -67 cm/s
RCCAPSYS: 125 cm/s
RIGHT CCA MID DIAS: -34 cm/s
RIGHT ECA DIAS: 19 cm/s
RIGHT VERTEBRAL DIAS: -17 cm/s
Right CCA prox dias: 17 cm/s

## 2015-09-25 NOTE — Progress Notes (Signed)
Chief Complaint: Follow up Extracranial Carotid Artery Stenosis   History of Present Illness  Jennifer [name] is a 75 y.o. female patient of Dr. Myra Gianotti who is status post ascending aorta innominate left CCA and left subclavian artery bypass graft in November 2011, and a right carotid endarterectomy in March 2013. She returns today for follow up. She denies tingling, numbness, weakness, or pain in either hand/arm. Patient reports headaches and right arm weakness with 20 mg Crestor qd which was stopped, mild myalgias on 10 mg Crestor 3x/week currently.  She states her blood pressure usually runs 134/76 or less, states her blood pressure goes up in Dr. offices.  Is walking twice/week, 2 miles.   Had 4 vessel CABG 2011, denies MI.  She denies any history of TIA or stroke symptoms. Specifically she denies a history of hemiparesis, unilateral facial drooping, amaurosis fugax, or speech difficulties.  She denies claudication symptoms in her legs with walking.  Pt Diabetic: A1C was 6.0 in June, 2017 (review of lab results pt brought with her), prediabetes Pt smoker: former smoker, quit in 2011 Pt meds include:  Statin : Yes, cannot tolerate high dose statins, had muscle spasms, is taking 10 mg Crestor 3x/week Betablocker: Yes  ASA: Yes  Other anticoagulants/antiplatelets: no     Past Medical History:  Diagnosis Date  . Anemia   . Arthritis   . Blood transfusion    1965  . Carotid artery occlusion   . Coronary artery disease Oct. 2011  . Eyesight diminished Change in eyesight  . GERD (gastroesophageal reflux disease)   . Headache(784.0)   . Hypercholesterolemia   . Hypertension   . Joint pain   . No pertinent past medical history    HX KIDNEY STONES  . Shortness of breath on exertion     Social History Social History  Substance Use Topics  . Smoking status: Former Smoker    Quit date: 12/16/2009  . Smokeless tobacco: Never Used  . Alcohol use No     Comment: VERY  LITTLE    Family History Family History  Problem Relation Age of Onset  . Diabetes Mother     Late onset  . Hyperlipidemia Mother   . Hypertension Mother   . Heart attack Mother   . Diabetes Sister     Late onset  . Hyperlipidemia Sister   . Hypertension Sister     AAA  . Cancer Brother     Prostate  . Heart disease Brother   . Hypertension Brother   . Cancer Brother     Prostate     Surgical History Past Surgical History:  Procedure Laterality Date  . ABDOMINAL HYSTERECTOMY    . BREAST BIOPSY     BILATERAL  BENIGN   . CARDIAC CATHETERIZATION     2011  DR Algie Coffer AT [facility]  . CARDIOVASCULAR STRESS TEST     2012 DR [facility] ALSO ECHO    . CAROTID ARTERY - SUBCLAVIAN ARTERY BYPASS GRAFT    . CAROTID ENDARTERECTOMY  05/06/11   Right CEA  . CESAREAN SECTION     X2   . CORONARY ARTERY BYPASS GRAFT     12/2009    . ENDARTERECTOMY  05/06/2011   Procedure: ENDARTERECTOMY CAROTID;  Surgeon: Nada Libman, MD;  Location: [facility] OR;  Service: Vascular;  Laterality: Right;  right carotid artery endarterctomy with vascu guard patch angioplasty   . EYE SURGERY     LASER BIL X2 FOR GLAUCOMA  .  NO PAST SURGERIES    . PR VEIN BYPASS GRAFT,AORTO-FEM-POP      Allergies  Allergen Reactions  . Statins Anaphylaxis    High dose ( Crestor 20 mg , pt cannot take)  . Penicillins Hives and Rash    Current Outpatient Prescriptions  Medication Sig Dispense Refill  . ALPHA LIPOIC ACID PO Take 250 mg by mouth daily.    Marland Kitchen amLODipine (NORVASC) 10 MG tablet TAKE 1 TABLET BY MOUTH DAILY. 30 tablet 10  . aspirin EC 81 MG tablet Take 81 mg by mouth every other day.    . cholecalciferol (VITAMIN D) 1000 UNITS tablet Take 1,000 Units by mouth 3 (three) times a week.    . co-enzyme Q-10 50 MG capsule Take 100 mg by mouth daily.    . Flaxseed, Linseed, 1000 MG CAPS Take 1 capsule by mouth daily.     . Garlic (GARLIQUE PO) Take 600 mg by mouth daily.     . lansoprazole (PREVACID) 15 MG capsule Take 15  mg by mouth. Reported on 05/13/2015    . Magnesium 250 MG TABS Take 1 tablet by mouth daily.    . metoprolol succinate (TOPROL-XL) 25 MG 24 hr tablet TAKE 1/2 TABLET BY MOUTH ONCE A DAY 15 tablet 4  . Multiple Vitamin (MULITIVITAMIN WITH MINERALS) TABS Take 1 tablet by mouth daily.    . Omega-3 Fatty Acids (FISH OIL) 1000 MG CAPS Take 1,000 mg by mouth daily. Mega Krill Fish oil    . rosuvastatin (CRESTOR) 10 MG tablet Take 10 mg by mouth 3 (three) times a week. On Monday, Wednesday, and Friday     . vitamin B-12 (CYANOCOBALAMIN) 500 MCG tablet Take 500 mcg by mouth daily.    . Vitamin Mixture (ESTER-C) 500-60 MG TABS Take 500 mg by mouth daily.     No current facility-administered medications for this visit.     Review of Systems : See HPI for pertinent positives and negatives.  Physical Examination  Vitals:   09/25/15 1104 09/25/15 1107 09/25/15 1108  BP: (!) 178/86 (!) 181/95 (!) 178/88  Pulse: 78 90 86  Resp: 16    Temp: 97.8 F (36.6 C)    TempSrc: Oral    SpO2: 98%    Weight: 155 lb (70.3 kg)    Height:  (1.6 m)     Body mass index is 27.46 kg/m.  General: WDWN female in NAD  GAIT: normal  Eyes: PERRLA  Pulmonary: Respirations are non labored, CTAB, good air movement.  Cardiac: regular rhythm, no murmur detected  VASCULAR EXAM  Carotid Bruits  Left  Right    Negative  Negative    Radial pulses are 2+ and equal   LE Pulses  LEFT  RIGHT   POPLITEAL  not palpable  not palpable   POSTERIOR TIBIAL  not palpable  not palpable   DORSALIS PEDIS  ANTERIOR TIBIAL  palpable  Not palpable    Gastrointestinal: soft, nontender, BS WNL, no r/g, no palpable masses.  Musculoskeletal: No muscle atrophy/wasting. M/S 5/5 throughout, Extremities without ischemic changes.  Neurologic: A&O X 3; Appropriate Affect,  Speech is normal  CN 2-12 intact except slight left side facial droop with smile, Pain and light touch intact  in extremities, Motor exam as listed above.     Non-Invasive Vascular Imaging CAROTID DUPLEX 09/25/2015   CEREBROVASCULAR DUPLEX EVALUATION    INDICATION: Carotid artery disease    PREVIOUS INTERVENTION(S): Aorta to innominate bypass. Left common carotid to  subclavian artery bypass graft 12/30/2009. Right carotid endarterectomy 05/06/2011    DUPLEX EXAM: Carotid duplex    RIGHT  LEFT  Peak Systolic Velocities (cm/s) End Diastolic Velocities (cm/s) Plaque LOCATION Peak Systolic Velocities (cm/s) End Diastolic Velocities (cm/s) Plaque  192 33  CCA PROXIMAL 102 27   133 34 HM CCA MID 103 27   95 17 HT CCA DISTAL 112 34 HT  109 19  ECA 308 49 HT  98 21 HT ICA PROXIMAL 331 84 HT  98 30  ICA MID 84 24   110 29  ICA DISTAL 70 19     NA ICA / CCA Ratio (PSV) NA  Antegrade Vertebral Flow Antegrade  - Brachial Systolic Pressure (mmHg) -  Triphasic Brachial Artery Waveforms Triphasic    Plaque Morphology:  HM = Homogeneous, HT = Heterogeneous, CP = Calcific Plaque, SP = Smooth Plaque, IP = Irregular Plaque     ADDITIONAL FINDINGS: Multiphasic subclavian arteries    IMPRESSION: 1. Patent right internal carotid artery status post endarterectomy with no evidence for restenosis. 2. 60 - 79% left internal carotid artery stenosis. 3. Patent left common carotid artery to subclavian artery bypass graft    Compared to the previous exam:  Increase of velocity since  exam of 03/27/2015      Assessment: Rodolph BongWanda S Partington is a 75 y.o. female who is s/p aorta to innominate, left common carotid, and left subclavian arterial bypass on 12/30/2009; right carotid endarterectomy 05/06/2011. She has no history or stroke or TIA.   Today's carotid Duplex suggests right internal carotid artery status post endarterectomy with no evidence for restenosis. 60 - 79% left internal carotid artery stenosis. Patent left common carotid artery to subclavian artery bypass graft  03/27/2015 PSV/EDV at the left proximal  ICA were 267/78 compared to 331/84 today, still within 60-79% stenosis.   Her atherosclerotic risk factors include CAD, hypertension, former smoker, dyslipidemia, and pre-diabetes.   She will go home and check her blood pressure, and if it remains greater than 140/90, she will notify her PCP.   Plan: Follow-up in 6 months with Carotid Duplex scan.   I discussed in depth with the patient the nature of atherosclerosis, and emphasized the importance of maximal medical management including strict control of blood pressure, blood glucose, and lipid levels, obtaining regular exercise, and continued cessation of smoking.  The patient is aware that without maximal medical management the underlying atherosclerotic disease process will progress, limiting the benefit of any interventions. The patient was given information about stroke prevention and what symptoms should prompt the patient to seek immediate medical care. Thank you for allowing us to participate in this patient's care.  Charisse MarchSuzanne Nickel, RN, MSN, FNP-C [facility] Office: [phone]  Clinic Physician: Darrick PennaFields  09/25/15 11:11 AM

## 2015-09-25 NOTE — Progress Notes (Signed)
Vitals:   09/25/15 1104 09/25/15 1107 09/25/15 1108  BP: (!) 178/86 (!) 181/95 (!) 178/88  Pulse: 78 90 86  Resp: 16    Temp: 97.8 F (36.6 C)    TempSrc: Oral    SpO2: 98%    Weight: 155 lb (70.3 kg)    Height: 5\' 3"  (1.6 m)

## 2015-09-25 NOTE — Patient Instructions (Signed)
Stroke Prevention Some medical conditions and behaviors are associated with an increased chance of having a stroke. You may prevent a stroke by making healthy choices and managing medical conditions. HOW CAN I REDUCE MY RISK OF HAVING A STROKE?   Stay physically active. Get at least 30 minutes of activity on most or all days.  Do not smoke. It may also be helpful to avoid exposure to secondhand smoke.  Limit alcohol use. Moderate alcohol use is considered to be:  No more than 2 drinks per day for men.  No more than 1 drink per day for nonpregnant women.  Eat healthy foods. This involves:  Eating 5 or more servings of fruits and vegetables a day.  Making dietary changes that address high blood pressure (hypertension), high cholesterol, diabetes, or obesity.  Manage your cholesterol levels.  Making food choices that are high in fiber and low in saturated fat, trans fat, and cholesterol may control cholesterol levels.  Take any prescribed medicines to control cholesterol as directed by your health care provider.  Manage your diabetes.  Controlling your carbohydrate and sugar intake is recommended to manage diabetes.  Take any prescribed medicines to control diabetes as directed by your health care provider.  Control your hypertension.  Making food choices that are low in salt (sodium), saturated fat, trans fat, and cholesterol is recommended to manage hypertension.  Ask your health care provider if you need treatment to lower your blood pressure. Take any prescribed medicines to control hypertension as directed by your health care provider.  If you are 18-39 years of age, have your blood pressure checked every 3-5 years. If you are 40 years of age or older, have your blood pressure checked every year.  Maintain a healthy weight.  Reducing calorie intake and making food choices that are low in sodium, saturated fat, trans fat, and cholesterol are recommended to manage  weight.  Stop drug abuse.  Avoid taking birth control pills.  Talk to your health care provider about the risks of taking birth control pills if you are over 35 years old, smoke, get migraines, or have ever had a blood clot.  Get evaluated for sleep disorders (sleep apnea).  Talk to your health care provider about getting a sleep evaluation if you snore a lot or have excessive sleepiness.  Take medicines only as directed by your health care provider.  For some people, aspirin or blood thinners (anticoagulants) are helpful in reducing the risk of forming abnormal blood clots that can lead to stroke. If you have the irregular heart rhythm of atrial fibrillation, you should be on a blood thinner unless there is a good reason you cannot take them.  Understand all your medicine instructions.  Make sure that other conditions (such as anemia or atherosclerosis) are addressed. SEEK IMMEDIATE MEDICAL CARE IF:   You have sudden weakness or numbness of the face, arm, or leg, especially on one side of the body.  Your face or eyelid droops to one side.  You have sudden confusion.  You have trouble speaking (aphasia) or understanding.  You have sudden trouble seeing in one or both eyes.  You have sudden trouble walking.  You have dizziness.  You have a loss of balance or coordination.  You have a sudden, severe headache with no known cause.  You have new chest pain or an irregular heartbeat. Any of these symptoms may represent a serious problem that is an emergency. Do not wait to see if the symptoms will   go away. Get medical help at once. Call your local emergency services (911 in [place] Do not drive yourself to the hospital.   This information is not intended to replace advice given to you by your health care provider. Make sure you discuss any questions you have with your health care provider.   Document Released: 03/11/2004 Document Revised: 02/22/2014 Document Reviewed:  08/04/2012 Elsevier Interactive Patient Education 2016 [employer]  

## 2015-10-22 NOTE — Addendum Note (Signed)
Addended by: Yolonda KidaEVANS, ASHLEIGH N on: 10/22/2015 02:10 PM   Modules accepted: Orders

## 2015-11-11 ENCOUNTER — Encounter: Payer: Self-pay | Admitting: Cardiology

## 2015-11-11 ENCOUNTER — Ambulatory Visit (INDEPENDENT_AMBULATORY_CARE_PROVIDER_SITE_OTHER): Payer: Medicare Other | Admitting: Cardiology

## 2015-11-11 VITALS — BP 160/90 | HR 81 | Ht 63.0 in | Wt 155.0 lb

## 2015-11-11 DIAGNOSIS — E785 Hyperlipidemia, unspecified: Secondary | ICD-10-CM | POA: Diagnosis not present

## 2015-11-11 DIAGNOSIS — I2583 Coronary atherosclerosis due to lipid rich plaque: Principal | ICD-10-CM

## 2015-11-11 DIAGNOSIS — I10 Essential (primary) hypertension: Secondary | ICD-10-CM | POA: Diagnosis not present

## 2015-11-11 DIAGNOSIS — I739 Peripheral vascular disease, unspecified: Secondary | ICD-10-CM | POA: Diagnosis not present

## 2015-11-11 DIAGNOSIS — I2510 Atherosclerotic heart disease of native coronary artery without angina pectoris: Secondary | ICD-10-CM | POA: Diagnosis not present

## 2015-11-11 NOTE — Patient Instructions (Signed)
Medication Instructions:  Your physician recommends that you continue on your current medications as directed. Please refer to the Current Medication list given to you today.  Labwork: None  Testing/Procedures: none  Follow-Up: Your physician wants you to follow-up in: 6 months with Dr Allyson SabalBerry. You will receive a reminder letter in the mail two months in advance. If you don't receive a letter, please call our office to schedule the follow-up appointment.  Any Other Special Instructions Will Be Listed Below (If Applicable).     If you need a refill on your cardiac medications before your next appointment, please call your pharmacy.

## 2015-11-11 NOTE — Assessment & Plan Note (Signed)
Intolerant to high dose stain Rx

## 2015-11-11 NOTE — Progress Notes (Signed)
11/11/2015 Jennifer [name]   [birth year]  [id number]  Primary Physician Ailene Ravel, MD Primary Cardiologist: Dr Allyson Sabal  HPI:  75 year old mildly overweight married Caucasian female mother of 2 children, grandmother and one grandchild was previously a patient of Dr. Algie [name]. She is a retired Museum/gallery exhibitions officer for [employer] and her niece, Jennifer [name], is an Charity fundraiser at the surgical center. The pt has a history of tobacco use, having quit in November of 2011 after 25 pack years. She has hypertension, mild CRI,  and dyslipidemia. She has a history of left carotid to subclavian artery bypass grafting by Dr. Myra [name] prior to coronary artery bypass grafting x4 in Nov 2011 by Dr Jennifer [name]. She then had right carotid endarterectomy performed by Dr. Myra [name] March 2013. She is here for 6 month check up. Her B/P is again elevated but she says her B/P is always up at the doctor's office. At home she tells me her B/P is "138". She denies any chest pain or dyspnea. She is active, mows her own yard with a push mower.  She brought in a recent lipid panel that showed her LDL was 118. She is intolerant to high dose statin Rx.     Current Outpatient Prescriptions  Medication Sig Dispense Refill  . ALPHA LIPOIC ACID PO Take 250 mg by mouth daily.    Marland Kitchen amLODipine (NORVASC) 10 MG tablet TAKE 1 TABLET BY MOUTH DAILY. 30 tablet 10  . aspirin EC 81 MG tablet Take 81 mg by mouth every other day.    . cholecalciferol (VITAMIN D) 1000 UNITS tablet Take 1,000 Units by mouth 3 (three) times a week.    . co-enzyme Q-10 50 MG capsule Take 100 mg by mouth daily.    . Flaxseed, Linseed, 1000 MG CAPS Take 1 capsule by mouth daily.     . Garlic (GARLIQUE PO) Take 600 mg by mouth daily.     . Magnesium 250 MG TABS Take 1 tablet by mouth daily.    . metoprolol succinate (TOPROL-XL) 25 MG 24 hr tablet TAKE 1/2 TABLET BY MOUTH ONCE A DAY 15 tablet 4  . Multiple Vitamin (MULITIVITAMIN WITH MINERALS) TABS Take 1 tablet by mouth  daily.    . Omega-3 Fatty Acids (FISH OIL) 1000 MG CAPS Take 1,000 mg by mouth daily. Mega Krill Fish oil    . rosuvastatin (CRESTOR) 10 MG tablet Take 10 mg by mouth 3 (three) times a week. On Monday, Wednesday, and Friday     . vitamin B-12 (CYANOCOBALAMIN) 500 MCG tablet Take 500 mcg by mouth daily.    . Vitamin Mixture (ESTER-C) 500-60 MG TABS Take 500 mg by mouth daily.     No current facility-administered medications for this visit.     Allergies  Allergen Reactions  . Statins Anaphylaxis    High dose ( Crestor 20 mg , pt cannot take)  . Penicillins Hives and Rash    Social History   Social History  . Marital status: Married    Spouse name: N/A  . Number of children: N/A  . Years of education: N/A   Occupational History  . Not on file.   Social History Main Topics  . Smoking status: Former Smoker    Quit date: 12/16/2009  . Smokeless tobacco: Never Used  . Alcohol use No     Comment: VERY LITTLE  . Drug use: No  . Sexual activity: Not on file   Other Topics Concern  . Not on  file   Social History Narrative  . No narrative on file     Review of Systems: General: negative for chills, fever, night sweats or weight changes.  Cardiovascular: negative for chest pain, dyspnea on exertion, edema, orthopnea, palpitations, paroxysmal nocturnal dyspnea or shortness of breath Dermatological: negative for rash Respiratory: negative for cough or wheezing Urologic: negative for hematuria Abdominal: negative for nausea, vomiting, diarrhea, bright red blood per rectum, melena, or hematemesis Neurologic: negative for visual changes, syncope, or dizziness All other systems reviewed and are otherwise negative except as noted above.    Blood pressure (!) 160/90, pulse 81, height 5\' 3"  (1.6 m), weight 155 lb (70.3 kg).  General appearance: alert, cooperative, no distress, mildly obese and nervous Neck: no JVD, supple, symmetrical, trachea midline and Lt CA bruit, Rt CA  scar Lungs: few crackles Lt base Heart: regular rate and rhythm Extremities: extremities normal, atraumatic, no cyanosis or edema Skin: Skin color, texture, turgor normal. No rashes or lesions Neurologic: Grossly normal  EKG NSR  ASSESSMENT AND PLAN:   Essential hypertension Her B/P remains higher than I would like. She declines further medication adjustment. I suggested a low dose diuretic but she says she can't tolerate that.   PVD- Lt CCA/Lt SCA BPG Nov 2011 and RCEA 2013 with residual moderate LICA disase Followed at Dr Estanislado SpireBrabham's office. Recent dopplers 09/25/15 showed 70% Lt ICA,  a patent Lt CCA BPG, and a patent Rt ICA BP site.   CABG x 4 2011 No angina  Dyslipidemia LDL 118- intolerant to high dose statin Rx- she declined to consider PCSK9 Rx  PLAN  I again discussed the importance of better B/P control and dicussed PCSK9 Rx. She is reluctant to change any Rx now. F/U with Dr Allyson SabalBerry in 6 months.   Corine ShelterLuke Walda Hertzog PA-C 11/11/2015 10:47 AM

## 2015-11-25 ENCOUNTER — Other Ambulatory Visit: Payer: Self-pay | Admitting: Cardiovascular Disease

## 2016-03-11 ENCOUNTER — Other Ambulatory Visit: Payer: Self-pay | Admitting: Cardiovascular Disease

## 2016-03-11 NOTE — Telephone Encounter (Signed)
Rx request sent to pharmacy.  

## 2016-03-23 ENCOUNTER — Observation Stay (HOSPITAL_COMMUNITY)
Admission: EM | Admit: 2016-03-23 | Discharge: 2016-03-24 | Disposition: A | Discharge status: Home / Self Care | Payer: Medicare Other | Attending: Internal Medicine | Admitting: Internal Medicine

## 2016-03-23 ENCOUNTER — Emergency Department (HOSPITAL_COMMUNITY): Payer: Medicare Other

## 2016-03-23 ENCOUNTER — Encounter (HOSPITAL_COMMUNITY): Payer: Self-pay | Admitting: Emergency Medicine

## 2016-03-23 DIAGNOSIS — Z87891 Personal history of nicotine dependence: Secondary | ICD-10-CM | POA: Diagnosis not present

## 2016-03-23 DIAGNOSIS — H532 Diplopia: Secondary | ICD-10-CM | POA: Diagnosis not present

## 2016-03-23 DIAGNOSIS — R29818 Other symptoms and signs involving the nervous system: Secondary | ICD-10-CM | POA: Diagnosis not present

## 2016-03-23 DIAGNOSIS — I10 Essential (primary) hypertension: Secondary | ICD-10-CM | POA: Diagnosis not present

## 2016-03-23 DIAGNOSIS — Z951 Presence of aortocoronary bypass graft: Secondary | ICD-10-CM | POA: Diagnosis not present

## 2016-03-23 DIAGNOSIS — R079 Chest pain, unspecified: Secondary | ICD-10-CM | POA: Diagnosis not present

## 2016-03-23 DIAGNOSIS — E785 Hyperlipidemia, unspecified: Secondary | ICD-10-CM | POA: Diagnosis not present

## 2016-03-23 DIAGNOSIS — I2510 Atherosclerotic heart disease of native coronary artery without angina pectoris: Secondary | ICD-10-CM | POA: Insufficient documentation

## 2016-03-23 DIAGNOSIS — Z79899 Other long term (current) drug therapy: Secondary | ICD-10-CM | POA: Insufficient documentation

## 2016-03-23 DIAGNOSIS — H5702 Anisocoria: Secondary | ICD-10-CM | POA: Diagnosis not present

## 2016-03-23 DIAGNOSIS — R0789 Other chest pain: Secondary | ICD-10-CM | POA: Diagnosis not present

## 2016-03-23 DIAGNOSIS — I6523 Occlusion and stenosis of bilateral carotid arteries: Secondary | ICD-10-CM | POA: Diagnosis not present

## 2016-03-23 DIAGNOSIS — I6529 Occlusion and stenosis of unspecified carotid artery: Secondary | ICD-10-CM | POA: Diagnosis not present

## 2016-03-23 DIAGNOSIS — H538 Other visual disturbances: Secondary | ICD-10-CM | POA: Diagnosis not present

## 2016-03-23 DIAGNOSIS — Z7982 Long term (current) use of aspirin: Secondary | ICD-10-CM | POA: Diagnosis not present

## 2016-03-23 LAB — COMPREHENSIVE METABOLIC PANEL
ALBUMIN: 4.6 g/dL (ref 3.5–5.0)
ALT: 19 U/L (ref 14–54)
AST: 33 U/L (ref 15–41)
Alkaline Phosphatase: 60 U/L (ref 38–126)
Anion gap: 12 (ref 5–15)
BILIRUBIN TOTAL: 0.6 mg/dL (ref 0.3–1.2)
BUN: 13 mg/dL (ref 6–20)
CHLORIDE: 104 mmol/L (ref 101–111)
CO2: 22 mmol/L (ref 22–32)
Calcium: 10.2 mg/dL (ref 8.9–10.3)
Creatinine, Ser: 1 mg/dL (ref 0.44–1.00)
GFR calc Af Amer: >60 mL/min (ref >60)
GFR calc non Af Amer: 55 mL/min — ABNORMAL LOW (ref >60)
GLUCOSE: 113 mg/dL — AB (ref 65–99)
POTASSIUM: 3.8 mmol/L (ref 3.5–5.1)
SODIUM: 138 mmol/L (ref 135–145)
Total Protein: 7.8 g/dL (ref 6.5–8.1)

## 2016-03-23 LAB — PROTIME-INR
INR: 0.96
Prothrombin Time: 12.8 seconds (ref 11.4–15.2)

## 2016-03-23 LAB — I-STAT CHEM 8, ED
BUN: 15 mg/dL (ref 6–20)
CHLORIDE: 105 mmol/L (ref 101–111)
Calcium, Ion: 1.2 mmol/L (ref 1.15–1.40)
Creatinine, Ser: 1 mg/dL (ref 0.44–1.00)
Glucose, Bld: 118 mg/dL — ABNORMAL HIGH (ref 65–99)
HEMATOCRIT: 41 % (ref 36.0–46.0)
Hemoglobin: 13.9 g/dL (ref 12.0–15.0)
POTASSIUM: 3.7 mmol/L (ref 3.5–5.1)
Sodium: 141 mmol/L (ref 135–145)
TCO2: 25 mmol/L (ref 0–100)

## 2016-03-23 LAB — DIFFERENTIAL
BASOS ABS: 0 10*3/uL (ref 0.0–0.1)
Basophils Relative: 0 %
EOS ABS: 0.2 10*3/uL (ref 0.0–0.7)
Eosinophils Relative: 2 %
LYMPHS ABS: 2.4 10*3/uL (ref 0.7–4.0)
Lymphocytes Relative: 31 %
Monocytes Absolute: 0.5 10*3/uL (ref 0.1–1.0)
Monocytes Relative: 6 %
NEUTROS PCT: 61 %
Neutro Abs: 4.8 10*3/uL (ref 1.7–7.7)

## 2016-03-23 LAB — CBC
HCT: 39 % (ref 36.0–46.0)
HEMOGLOBIN: 13.4 g/dL (ref 12.0–15.0)
MCH: 31.8 pg (ref 26.0–34.0)
MCHC: 34.4 g/dL (ref 30.0–36.0)
MCV: 92.4 fL (ref 78.0–100.0)
Platelets: 239 10*3/uL (ref 150–400)
RBC: 4.22 MIL/uL (ref 3.87–5.11)
RDW: 13.4 % (ref 11.5–15.5)
WBC: 7.8 10*3/uL (ref 4.0–10.5)

## 2016-03-23 LAB — CBG MONITORING, ED: Glucose-Capillary: 116 mg/dL — ABNORMAL HIGH (ref 65–99)

## 2016-03-23 LAB — I-STAT TROPONIN, ED
TROPONIN I, POC: 0 ng/mL (ref 0.00–0.08)
Troponin i, poc: 0 ng/mL (ref 0.00–0.08)

## 2016-03-23 LAB — APTT: APTT: 29 s (ref 24–36)

## 2016-03-23 MED ORDER — GADOBENATE DIMEGLUMINE 529 MG/ML IV SOLN
15.0000 mL | Freq: Once | INTRAVENOUS | Status: AC
  Administered 2016-03-23: 15 mL via INTRAVENOUS

## 2016-03-23 NOTE — ED Notes (Signed)
MD at bedside. 

## 2016-03-23 NOTE — Consult Note (Signed)
Admission H&P    Chief Complaint: Diplopia HPI: Jennifer [name] is an 76 y.o. female presented with acute onset of vertical diplopia. Its unclear if this was binocular because it only lasted for few seconds and resolved. She said it came back again immediately and lasted for few seconds then resolved. There is no facial droop, slurring of speech, weakness or numbness associated with diplopia. At this time her symptoms have completely resolved. She also reports that when this happened second time she also noted sharp mid sternal chest pain. She thinks this was due to anxiety attack.  She has a history of right carotid artery endarterectomy due to significant blockage but never had a TIA or stroke.  History of Stroke: No.  Date last known well:Date: 03/23/2016 Time last known well: Time: 16:00  tPA Given: No: symptoms resolved. MRankin: 0  Past Medical History:  Diagnosis Date  . Anemia   . Arthritis   . Blood transfusion    1965  . Carotid artery occlusion   . Coronary artery disease Oct. 2011  . Eyesight diminished Change in eyesight  . GERD (gastroesophageal reflux disease)   . Headache(784.0)   . Hypercholesterolemia   . Hypertension   . Joint pain   . No pertinent past medical history    HX KIDNEY STONES  . Shortness of breath on exertion     Past Surgical History:  Procedure Laterality Date  . ABDOMINAL HYSTERECTOMY    . BREAST BIOPSY     BILATERAL  BENIGN   . CARDIAC CATHETERIZATION     2011  DR Doylene Canard AT [facility] TEST     2012 DR [facility] ALSO ECHO    . CAROTID ARTERY - SUBCLAVIAN ARTERY BYPASS GRAFT    . CAROTID ENDARTERECTOMY  05/06/11   Right CEA  . CESAREAN SECTION     X2   . CORONARY ARTERY BYPASS GRAFT     12/2009    . ENDARTERECTOMY  05/06/2011   Procedure: ENDARTERECTOMY CAROTID;  Surgeon: Serafina Mitchell, MD;  Location: [facility] OR;  Service: Vascular;  Laterality: Right;  right carotid artery endarterctomy with vascu guard patch angioplasty    . EYE SURGERY     LASER BIL X2 FOR GLAUCOMA  . NO PAST SURGERIES    . PR VEIN BYPASS GRAFT,AORTO-FEM-POP      Family History  Problem Relation Age of Onset  . Diabetes Mother     Late onset  . Hyperlipidemia Mother   . Hypertension Mother   . Heart attack Mother   . Diabetes Sister     Late onset  . Hyperlipidemia Sister   . Hypertension Sister     AAA  . Cancer Brother     Prostate  . Heart disease Brother   . Hypertension Brother   . Cancer Brother     Prostate    Social History:  reports that she quit smoking about 6 years ago. She has never used smokeless tobacco. She reports that she does not drink alcohol or use drugs.  Allergies:  Allergies  Allergen Reactions  . Statins Anaphylaxis    High dose ( Crestor 20 mg , pt cannot take)  . Penicillins Hives and Rash    (Not in a hospital admission)  ROS: 10 points ROS was negative other than chest pain  Physical Examination: Blood pressure (!) 178/102, pulse 112, resp. rate 20, SpO2 100 %.  HEENT-  Normocephalic, no lesions, Neck supple and no rigidity was  appreciated. Cardiovascular - regular rate and rhythm, S1, S2 normal, no murmur, click, rub or gallop Lungs - chest clear, no wheezing, rales, normal symmetric air entry, Heart exam - S1, S2 normal, no murmur, no gallop, rate regular Abdomen - soft nontender and nondistended  Neurologic Examination: Mental status: Awake alert and oriented to all spheres. Speech and language: No evidence of dysarthria was appreciated. Comprehension intact. Naming intact. Fluent. Reading intact. Cranial nerves: Right pupil 64m and poorly reactive to light. Left 216mand reactive to light with irregular shape. Extra muscles intact. Facial sensation symmetric. No facial droop is appreciated. Hearing intact. Uvula midline. Tongue midline. Motor: 5/5 throughout Sensory: Normal sensation to light touch and temperature. Coordination: Normal finger to nose and heel to show bilaterally  no evidence of dysdiadochokinesia. Gait: Deferred  Results for orders placed or performed during the hospital encounter of 03/23/16 (from the past 48 hour(s))  Protime-INR     Status: None   Collection Time: 03/23/16  6:31 PM  Result Value Ref Range   Prothrombin Time 12.8 11.4 - 15.2 seconds   INR 0.96   APTT     Status: None   Collection Time: 03/23/16  6:31 PM  Result Value Ref Range   aPTT 29 24 - 36 seconds  CBC     Status: None   Collection Time: 03/23/16  6:31 PM  Result Value Ref Range   WBC 7.8 4.0 - 10.5 K/uL   RBC 4.22 3.87 - 5.11 MIL/uL   Hemoglobin 13.4 12.0 - 15.0 g/dL   HCT 39.0 36.0 - 46.0 %   MCV 92.4 78.0 - 100.0 fL   MCH 31.8 26.0 - 34.0 pg   MCHC 34.4 30.0 - 36.0 g/dL   RDW 13.4 11.5 - 15.5 %   Platelets 239 150 - 400 K/uL  Differential     Status: None   Collection Time: 03/23/16  6:31 PM  Result Value Ref Range   Neutrophils Relative % 61 %   Neutro Abs 4.8 1.7 - 7.7 K/uL   Lymphocytes Relative 31 %   Lymphs Abs 2.4 0.7 - 4.0 K/uL   Monocytes Relative 6 %   Monocytes Absolute 0.5 0.1 - 1.0 K/uL   Eosinophils Relative 2 %   Eosinophils Absolute 0.2 0.0 - 0.7 K/uL   Basophils Relative 0 %   Basophils Absolute 0.0 0.0 - 0.1 K/uL  Comprehensive metabolic panel     Status: Abnormal   Collection Time: 03/23/16  6:31 PM  Result Value Ref Range   Sodium 138 135 - 145 mmol/L   Potassium 3.8 3.5 - 5.1 mmol/L   Chloride 104 101 - 111 mmol/L   CO2 22 22 - 32 mmol/L   Glucose, Bld 113 (H) 65 - 99 mg/dL   BUN 13 6 - 20 mg/dL   Creatinine, Ser 1.00 0.44 - 1.00 mg/dL   Calcium 10.2 8.9 - 10.3 mg/dL   Total Protein 7.8 6.5 - 8.1 g/dL   Albumin 4.6 3.5 - 5.0 g/dL   AST 33 15 - 41 U/L   ALT 19 14 - 54 U/L   Alkaline Phosphatase 60 38 - 126 U/L   Total Bilirubin 0.6 0.3 - 1.2 mg/dL   GFR calc non Af Amer 55 (L) >60 mL/min   GFR calc Af Amer >60 >60 mL/min    Comment: (NOTE) The eGFR has been calculated using the CKD EPI equation. This calculation has not  been validated in all clinical situations. eGFR's persistently <60 mL/min  signify possible Chronic Kidney Disease.    Anion gap 12 5 - 15  I-stat troponin, ED     Status: None   Collection Time: 03/23/16  6:44 PM  Result Value Ref Range   Troponin i, poc 0.00 0.00 - 0.08 ng/mL   Comment 3            Comment: Due to the release kinetics of cTnI, a negative result within the first hours of the onset of symptoms does not rule out myocardial infarction with certainty. If myocardial infarction is still suspected, repeat the test at appropriate intervals.   CBG monitoring, ED     Status: Abnormal   Collection Time: 03/23/16  6:46 PM  Result Value Ref Range   Glucose-Capillary 116 (H) 65 - 99 mg/dL  I-Stat Chem 8, ED     Status: Abnormal   Collection Time: 03/23/16  6:46 PM  Result Value Ref Range   Sodium 141 135 - 145 mmol/L   Potassium 3.7 3.5 - 5.1 mmol/L   Chloride 105 101 - 111 mmol/L   BUN 15 6 - 20 mg/dL   Creatinine, Ser 1.00 0.44 - 1.00 mg/dL   Glucose, Bld 118 (H) 65 - 99 mg/dL   Calcium, Ion 1.20 1.15 - 1.40 mmol/L   TCO2 25 0 - 100 mmol/L   Hemoglobin 13.9 12.0 - 15.0 g/dL   HCT 41.0 36.0 - 46.0 %   Ct Head Code Stroke W/o Cm  Result Date: 03/23/2016 CLINICAL DATA:  Code stroke. Vertical diplopia, dilated RIGHT pupil. EXAM: CT HEAD WITHOUT CONTRAST TECHNIQUE: Contiguous axial images were obtained from the base of the skull through the vertex without intravenous contrast. COMPARISON:  02/28/2010 CTA head. FINDINGS: Brain: No evidence of acute infarction, hemorrhage, hydrocephalus, extra-axial collection or mass lesion/mass effect. Mild atrophy, particularly in the bifrontal regions. There is focal encephalomalacia in the inferior LEFT frontal lobe. This could relate to an old trauma. Hypoattenuation of white matter suggestive of chronic microvascular ischemic change. Vascular: No hyperdense vessel. Mild vascular calcification in the carotid siphon regions as well as the  distal basilar. Skull: Normal. Negative for fracture or focal lesion. Sinuses/Orbits: No acute finding. Other: None.  Compared with prior study, no abnormality is seen ASPECTS [facility] Stroke Program Early CT Score) - Ganglionic level infarction (caudate, lentiform nuclei, internal capsule, insula, M1-M3 cortex): 7 - Supraganglionic infarction (M4-M6 cortex): 3 Total score (0-10 with 10 being normal): 10 IMPRESSION: 1. Bifrontal atrophy with focal LEFT inferior frontal encephalomalacia, query old trauma. No acute intracranial findings. 2. ASPECTS is 10 A call was placed at the time of interpretation on 03/23/2016 at 6:28 pm to Dr. Alver Fisher. Electronically Signed   By: Staci Righter M.D.   On: 03/23/2016 18:30   Assessment: 76 y.o. female presented with acute onset of vertical diplopia which is now resolved. Neurological examination with anisocoria. Unclear if anisocoria is chronic or new. Ddx include carotid artery dissection vs physiological vs chronic from eye surgeries. On this note, acute onset of diplopia and sudden improvement is concerning for possible TIA.  Stroke Risk Factors - carotid stenosis, hyperlipidemia and hypertension  Plan: 1. HgbA1c, fasting lipid panel 2. MRI and MRA ordered with contrast to rule out any dissection as well. 3. PT consult, OT consult, Speech consult 4. Echocardiogram to look for any evidence of cardioembolism 6. Prophylactic therapy-Antiplatelet med: Aspirin - dose 54m daily 7. Risk factor modification 8. Telemetry monitoring  Stroke education, treatment, and current plan discussed with patient/significant other: Yes.  Jennifer [name] 03/23/2016, 7:23 PM

## 2016-03-23 NOTE — ED Triage Notes (Signed)
Per EMS:  PT presents to ED for assessment after having three episodes of blurred vision, then anxiety with chest pressure.  Pt now pain free.  Patient given 324 ASA en route.

## 2016-03-23 NOTE — ED Notes (Signed)
ACTIVATED CODE STROKE WITH CARELINK @ 18:13

## 2016-03-23 NOTE — ED Notes (Signed)
Pt continues to be in MRI  

## 2016-03-23 NOTE — ED Notes (Signed)
Pt provided with warm blanket, update, and [place] bag, okay'd by admitting MD

## 2016-03-23 NOTE — H&P (Signed)
History and Physical    Jennifer [name] ZOX:[id number] DOB: 1940-12-30 DOA: 03/23/2016  PCP: Lonie Peak, PA-C Consultants:  Allyson Sabal - cardiology; Myra Gianotti - vascular Patient coming from: home - lives with husband; NOK: husband, [phone]  Chief Complaint: chest pain, double vision  HPI: Jennifer [name] is a 76 y.o. female with medical history significant of HTN, HLD, CAD s/p CABG in 2011, carotid stenosis s/p R CEA presenting after she had acute onset double vision x 2 back to back.  It scared her, since she is familiar with stroke symptoms.  BP shot up to 200/100+, pulse 105.  Also with chest tightness, substernal, lasted 45 minutes or so.  Started easing up as her BP went down.  Denies h/o chest tightness.  Has intermittent SOB but she did not experience this during the chest tightness.  Non-exertional.  Resolved spontaneously.  No numbness/weakness/tingling.  Diplopia only lasted for seconds.  No facial droop, dysphagia, dysarthria.   ED Course: Per Dr. Rush Landmark: Jennifer [name] is a 76 y.o. female with past medical history significant for CAD status post CABG, hypertension, and prior right-sided carotid endarterectomy who presents with sudden onset of chest pressure, shortness of breath, intermittent headache, and intermittent vertical diplopia.  History and exam are seen above.  On exam, patient found to have in his coria with a difference in right versus left pupil. The right pupil was approximately 2 mm larger than the left at 4 mm and 2 mm respectively. Patient had no facial droop. Patient had no other focal neurologic deficits. Patient denied eye pain and also denied any blurriness of vision in the right eye. Patient had no chest tenderness and lungs were clear. Abdomen was nontender. Patient had symmetric pulses in extremities. No significant extremity edema. Next  After discovery of sudden onset neurologic deficit and the persistent anisocoria which the patient says is new, code stroke was called.  Next  Patient quickly taken to CT scanner where workup was initiated. Neurology team came to the bedside and did not feel patient had evidence of stroke on CT imaging. They recommended MRI/MRA of the head and neck to continue her workup.  Given the patient's report of chest pressure similar to prior and with her history of CAD with CABG, patient will need admission for high risk chest pain. Anticipate following up on MRI results.  RI results showed no evidence of acute stroke but did show evidence of stenosis of the left carotid artery. Patient reports that she knows about this and is being worked up for it.  Given the lack of acute stroke, neurology recommended other workup and admission. Given her high risk chest pain and history of CAD, patient will be admitted for further management.  Hospitalist team called and they will admit her for further workup of her chest pain and for continuing her workup of the transient diplopia.   Review of Systems: As per HPI; otherwise 10 point review of systems reviewed and negative.   Ambulatory Status:  Ambulates independently.  Past Medical History:  Diagnosis Date  . Anemia   . Arthritis   . Blood transfusion    1965  . Carotid artery occlusion   . Coronary artery disease Oct. 2011  . Eyesight diminished Change in eyesight  . GERD (gastroesophageal reflux disease)   . Headache(784.0)   . Hypercholesterolemia   . Hypertension   . Joint pain   . Shortness of breath on exertion     Past Surgical History:  Procedure Laterality  Date  . ABDOMINAL HYSTERECTOMY    . BREAST BIOPSY     BILATERAL  BENIGN   . CARDIAC CATHETERIZATION     2011  [facility]  . [facility]ASCULAR STRESS [facility]     [facility]    . CAROTID ARTERY - SUBCLAVIAN ARTERY BYPASS GRAFT    . CESAREAN SECTION     X2   . CORONARY ARTERY BYPASS GRAFT     12/2009, x 4 plus 3 in left subclavian  . ENDARTERECTOMY  05/06/2011   Procedure: ENDARTERECTOMY CAROTID;   Surgeon: Nada LibmanVance W Brabham, MD;  [facility];  Service: Vascular;  Laterality: Right;  right carotid artery endarterctomy with vascu guard patch angioplasty   . EYE SURGERY     LASER BIL X2 FOR GLAUCOMA  . PR VEIN BYPASS GRAFT,AORTO-FEM-POP      Social History   Social History  . Marital status: Married    Spouse name: N/A  . Number of children: N/A  . Years of education: N/A   Occupational History  . retired    Social History Main Topics  . Smoking status: Former Smoker    Years: 25.00    Quit date: 12/16/2009  . Smokeless tobacco: Never Used  . Alcohol use Yes     Comment: some  . Drug use: No  . Sexual activity: Not on file   Other Topics Concern  . Not on file   Social History Narrative  . No narrative on file    Allergies  Allergen Reactions  . Statins Anaphylaxis    High dose ( Crestor 20 mg , pt cannot take)  . Penicillins Hives and Rash    Has patient had a PCN reaction causing immediate rash, facial/tongue/throat swelling, SOB or lightheadedness with hypotension:YES Has patient had a PCN reaction causing severe rash involving mucus membranes or skin necrosis: NO Has patient had a PCN reaction that required hospitalization NO Has patient had a PCN reaction occurring within the last 10 years: NO If all of the above answers are "NO", then may proceed with Cephalosporin use.    Family History  Problem Relation Age of Onset  . Diabetes Mother     Late onset  . Hyperlipidemia Mother   . Hypertension Mother   . Heart attack Mother   . Diabetes Sister     Late onset  . Hyperlipidemia Sister   . Hypertension Sister     AAA  . Cancer Brother     Prostate  . Heart disease Brother   . Hypertension Brother   . Cancer Brother     Prostate     Prior to Admission medications   Medication Sig Start Date End Date Taking? Authorizing Provider  ALPHA LIPOIC ACID PO Take 250 mg by mouth daily.    Historical Provider, MD  amLODipine (NORVASC) 10 MG tablet TAKE  1 TABLET BY MOUTH DAILY. 03/11/16   Runell GessJonathan J Berry, MD  aspirin EC 81 MG tablet Take 81 mg by mouth every other day.    Historical Provider, MD  cholecalciferol (VITAMIN D) 1000 UNITS tablet Take 1,000 Units by mouth 3 (three) times a week.    Historical Provider, MD  co-enzyme Q-10 50 MG capsule Take 100 mg by mouth daily.    Historical Provider, MD  Flaxseed, Linseed, 1000 MG CAPS Take 1 capsule by mouth daily.     Historical Provider, MD  Garlic (GARLIQUE PO) Take 600 mg by mouth daily.  Historical Provider, MD  Magnesium 250 MG TABS Take 1 tablet by mouth daily.    Historical Provider, MD  metoprolol succinate (TOPROL-XL) 25 MG 24 hr tablet TAKE 1/2 TABLET BY MOUTH ONCE A DAY 11/25/15   Runell Gess, MD  Multiple Vitamin (MULITIVITAMIN WITH MINERALS) TABS Take 1 tablet by mouth daily.    Historical Provider, MD  Omega-3 Fatty Acids (FISH OIL) 1000 MG CAPS Take 1,000 mg by mouth daily. Mega Krill Fish oil    Historical Provider, MD  rosuvastatin (CRESTOR) 10 MG tablet Take 10 mg by mouth 3 (three) times a week. On Monday, Wednesday, and Friday     Historical Provider, MD  vitamin B-12 (CYANOCOBALAMIN) 500 MCG tablet Take 500 mcg by mouth daily.    Historical Provider, MD  Vitamin Mixture (ESTER-C) 500-60 MG TABS Take 500 mg by mouth daily.    Historical Provider, MD    Physical Exam: Vitals:   03/23/16 1759 03/23/16 1800 03/23/16 2103 03/23/16 2300  BP:  (!) 178/102 143/85 147/77  Pulse:  112 93 88  Resp:  20 22 15   SpO2: 95% 100% 99% 96%     General:  Appears calm and comfortable and is NAD Eyes:  PERRL, EOMI, normal lids, iris.  Right pupillary anisocoria noted. ENT:  grossly normal hearing, lips & tongue, mmm Neck:  no LAD, masses or thyromegaly Cardiovascular:  RRR, no m/r/g. No LE edema.  Respiratory:  CTA bilaterally, no w/r/r. Normal respiratory effort. Abdomen:  soft, ntnd, NABS Skin:  no rash or induration seen on limited exam Musculoskeletal:  grossly normal tone  BUE/BLE, good ROM, no bony abnormality Psychiatric:  grossly normal mood and affect, speech fluent and appropriate, AOx3 Neurologic:  CN 2-12 grossly intact, moves all extremities in coordinated fashion, sensation intact  Labs on Admission: I have personally reviewed following labs and imaging studies  CBC:  Recent Labs Lab 03/23/16 1831 03/23/16 1846  WBC 7.8  --   NEUTROABS 4.8  --   HGB 13.4 13.9  HCT 39.0 41.0  MCV 92.4  --   PLT 239  --    Basic Metabolic Panel:  Recent Labs Lab 03/23/16 1831 03/23/16 1846  NA 138 141  K 3.8 3.7  CL 104 105  CO2 22  --   GLUCOSE 113* 118*  BUN 13 15  CREATININE 1.00 1.00  CALCIUM 10.2  --    GFR: CrCl cannot be calculated (Unknown ideal weight.). Liver Function Tests:  Recent Labs Lab 03/23/16 1831  AST 33  ALT 19  ALKPHOS 60  BILITOT 0.6  PROT 7.8  ALBUMIN 4.6   No results for input(s): LIPASE, AMYLASE in the last 168 hours. No results for input(s): AMMONIA in the last 168 hours. Coagulation Profile:  Recent Labs Lab 03/23/16 1831  INR 0.96   Cardiac Enzymes: No results for input(s): CKTOTAL, CKMB, CKMBINDEX, TROPONINI in the last 168 hours. BNP (last 3 results) No results for input(s): PROBNP in the last 8760 hours. HbA1C: No results for input(s): HGBA1C in the last 72 hours. CBG:  Recent Labs Lab 03/23/16 1846  GLUCAP 116*   Lipid Profile: No results for input(s): CHOL, HDL, LDLCALC, TRIG, CHOLHDL, LDLDIRECT in the last 72 hours. Thyroid Function Tests: No results for input(s): TSH, T4TOTAL, FREET4, T3FREE, THYROIDAB in the last 72 hours. Anemia Panel: No results for input(s): VITAMINB12, FOLATE, FERRITIN, TIBC, IRON, RETICCTPCT in the last 72 hours. Urine analysis:    Component Value Date/Time   COLORURINE YELLOW 04/27/2011  0955   APPEARANCEUR CLEAR 04/27/2011 0955   LABSPEC 1.009 04/27/2011 0955   PHURINE 7.0 04/27/2011 0955   GLUCOSEU NEGATIVE 04/27/2011 0955   HGBUR NEGATIVE 04/27/2011  0955   BILIRUBINUR NEGATIVE 04/27/2011 0955   KETONESUR NEGATIVE 04/27/2011 0955   PROTEINUR NEGATIVE 04/27/2011 0955   UROBILINOGEN 0.2 04/27/2011 0955   NITRITE NEGATIVE 04/27/2011 0955   LEUKOCYTESUR MODERATE (A) 04/27/2011 0955    Creatinine Clearance: CrCl cannot be calculated (Unknown ideal weight.).  Sepsis Labs: @LABRCNTIP (procalcitonin:4,lacticidven:4) )No results found for this or any previous visit (from the past 240 hour(s)).   Radiological Exams on Admission: Mr Shirlee Latch UJ Contrast  Result Date: 03/23/2016 CLINICAL DATA:  Three episodes of blurred vision, now resolved. EXAM: MRI HEAD WITHOUT AND WITH CONTRAST AND MRA HEAD WITHOUT AND WITH CONTRAST AND MRI NECK WITHOUT AND WITH CONTRAST TECHNIQUE: Multiplanar, multiecho pulse sequences of the brain and surrounding structures were obtained without and with intravenous contrast. Angiographic images of the head were obtained using MRA technique without contrast. Multiplanar, multiecho pulse sequences of the neck and surrounding structures were obtained without and with intravenous contrast. CONTRAST:  15mL MULTIHANCE GADOBENATE DIMEGLUMINE 529 MG/ML IV SOLN COMPARISON:  CT head earlier today. FINDINGS: MRI HEAD FINDINGS Brain: No evidence for acute infarction, hemorrhage, mass lesion, hydrocephalus, or extra-axial fluid. Bifrontal atrophy. Focal inferior LEFT frontal encephalomalacia. Chronic blood products in this region along with gliosis suggest previous remote head trauma. Mild T2 and FLAIR hyperintensities in the periventricular and subcortical white matter suggests superimposed chronic microvascular ischemic change. Post infusion, no abnormal enhancement of the brain or meninges. Vascular: Normal flow voids. Skull and upper cervical spine: Normal marrow signal. Sinuses/Orbits: Negative. Other: None. MRA HEAD FINDINGS The internal carotid arteries are widely patent. The basilar artery is widely patent. Both vertebrals contribute to  its formation, RIGHT being slightly larger. Hypoplastic LEFT A1 anterior cerebral artery. No intracranial stenosis or aneurysm. MRA NECK FINDINGS Dolichoectatic great vessels, originated in normal branching pattern from the arch. Prior RIGHT carotid endarterectomy, with venous patch graft appears patulous but widely patent. At the LEFT carotid bifurcation, there is a high-grade stenosis of the LEFT internal carotid artery and distal common carotid artery extending into the LEFT external carotid artery. Estimated stenosis is 75-90%, possibly greater. RIGHT vertebral dominant and widely patent. LEFT vertebral ostial stenosis estimated 50-75%. Subclavian stenosis just distal to the vertebral origin estimated 50%. IMPRESSION: No acute intracranial findings. Atrophy and small vessel disease, suspected remote LEFT inferior frontal trauma, but no evidence for acute stroke. No abnormal postcontrast enhancement. Unremarkable MR angiography of the intracranial circulation. High-grade stenosis of the LEFT carotid bifurcation affecting the distal common carotid artery and origin of the LEFT internal and external carotid artery. Stenosis is 75-90%, possibly greater. Correlate clinically as a cause of blurred vision. Electronically Signed   By: Elsie Stain M.D.   On: 03/23/2016 20:45   Mr Angiogram Neck W Or Wo Contrast  Result Date: 03/23/2016 CLINICAL DATA:  Three episodes of blurred vision, now resolved. EXAM: MRI HEAD WITHOUT AND WITH CONTRAST AND MRA HEAD WITHOUT AND WITH CONTRAST AND MRI NECK WITHOUT AND WITH CONTRAST TECHNIQUE: Multiplanar, multiecho pulse sequences of the brain and surrounding structures were obtained without and with intravenous contrast. Angiographic images of the head were obtained using MRA technique without contrast. Multiplanar, multiecho pulse sequences of the neck and surrounding structures were obtained without and with intravenous contrast. CONTRAST:  15mL MULTIHANCE GADOBENATE DIMEGLUMINE  529 MG/ML IV SOLN COMPARISON:  CT head  earlier today. FINDINGS: MRI HEAD FINDINGS Brain: No evidence for acute infarction, hemorrhage, mass lesion, hydrocephalus, or extra-axial fluid. Bifrontal atrophy. Focal inferior LEFT frontal encephalomalacia. Chronic blood products in this region along with gliosis suggest previous remote head trauma. Mild T2 and FLAIR hyperintensities in the periventricular and subcortical white matter suggests superimposed chronic microvascular ischemic change. Post infusion, no abnormal enhancement of the brain or meninges. Vascular: Normal flow voids. Skull and upper cervical spine: Normal marrow signal. Sinuses/Orbits: Negative. Other: None. MRA HEAD FINDINGS The internal carotid arteries are widely patent. The basilar artery is widely patent. Both vertebrals contribute to its formation, RIGHT being slightly larger. Hypoplastic LEFT A1 anterior cerebral artery. No intracranial stenosis or aneurysm. MRA NECK FINDINGS Dolichoectatic great vessels, originated in normal branching pattern from the arch. Prior RIGHT carotid endarterectomy, with venous patch graft appears patulous but widely patent. At the LEFT carotid bifurcation, there is a high-grade stenosis of the LEFT internal carotid artery and distal common carotid artery extending into the LEFT external carotid artery. Estimated stenosis is 75-90%, possibly greater. RIGHT vertebral dominant and widely patent. LEFT vertebral ostial stenosis estimated 50-75%. Subclavian stenosis just distal to the vertebral origin estimated 50%. IMPRESSION: No acute intracranial findings. Atrophy and small vessel disease, suspected remote LEFT inferior frontal trauma, but no evidence for acute stroke. No abnormal postcontrast enhancement. Unremarkable MR angiography of the intracranial circulation. High-grade stenosis of the LEFT carotid bifurcation affecting the distal common carotid artery and origin of the LEFT internal and external carotid artery.  Stenosis is 75-90%, possibly greater. Correlate clinically as a cause of blurred vision. Electronically Signed   By: Elsie Stain M.D.   On: 03/23/2016 20:45   Mr Laqueta Jean ZO Contrast  Result Date: 03/23/2016 CLINICAL DATA:  Three episodes of blurred vision, now resolved. EXAM: MRI HEAD WITHOUT AND WITH CONTRAST AND MRA HEAD WITHOUT AND WITH CONTRAST AND MRI NECK WITHOUT AND WITH CONTRAST TECHNIQUE: Multiplanar, multiecho pulse sequences of the brain and surrounding structures were obtained without and with intravenous contrast. Angiographic images of the head were obtained using MRA technique without contrast. Multiplanar, multiecho pulse sequences of the neck and surrounding structures were obtained without and with intravenous contrast. CONTRAST:  15mL MULTIHANCE GADOBENATE DIMEGLUMINE 529 MG/ML IV SOLN COMPARISON:  CT head earlier today. FINDINGS: MRI HEAD FINDINGS Brain: No evidence for acute infarction, hemorrhage, mass lesion, hydrocephalus, or extra-axial fluid. Bifrontal atrophy. Focal inferior LEFT frontal encephalomalacia. Chronic blood products in this region along with gliosis suggest previous remote head trauma. Mild T2 and FLAIR hyperintensities in the periventricular and subcortical white matter suggests superimposed chronic microvascular ischemic change. Post infusion, no abnormal enhancement of the brain or meninges. Vascular: Normal flow voids. Skull and upper cervical spine: Normal marrow signal. Sinuses/Orbits: Negative. Other: None. MRA HEAD FINDINGS The internal carotid arteries are widely patent. The basilar artery is widely patent. Both vertebrals contribute to its formation, RIGHT being slightly larger. Hypoplastic LEFT A1 anterior cerebral artery. No intracranial stenosis or aneurysm. MRA NECK FINDINGS Dolichoectatic great vessels, originated in normal branching pattern from the arch. Prior RIGHT carotid endarterectomy, with venous patch graft appears patulous but widely patent. At the  LEFT carotid bifurcation, there is a high-grade stenosis of the LEFT internal carotid artery and distal common carotid artery extending into the LEFT external carotid artery. Estimated stenosis is 75-90%, possibly greater. RIGHT vertebral dominant and widely patent. LEFT vertebral ostial stenosis estimated 50-75%. Subclavian stenosis just distal to the vertebral origin estimated 50%. IMPRESSION: No acute intracranial  findings. Atrophy and small vessel disease, suspected remote LEFT inferior frontal trauma, but no evidence for acute stroke. No abnormal postcontrast enhancement. Unremarkable MR angiography of the intracranial circulation. High-grade stenosis of the LEFT carotid bifurcation affecting the distal common carotid artery and origin of the LEFT internal and external carotid artery. Stenosis is 75-90%, possibly greater. Correlate clinically as a cause of blurred vision. Electronically Signed   By: Elsie Stain M.D.   On: 03/23/2016 20:45   Ct Head Code Stroke W/o Cm  Result Date: 03/23/2016 CLINICAL DATA:  Code stroke. Vertical diplopia, dilated RIGHT pupil. EXAM: CT HEAD WITHOUT CONTRAST TECHNIQUE: Contiguous axial images were obtained from the base of the skull through the vertex without intravenous contrast. COMPARISON:  02/28/2010 CTA head. FINDINGS: Brain: No evidence of acute infarction, hemorrhage, hydrocephalus, extra-axial collection or mass lesion/mass effect. Mild atrophy, particularly in the bifrontal regions. There is focal encephalomalacia in the inferior LEFT frontal lobe. This could relate to an old trauma. Hypoattenuation of white matter suggestive of chronic microvascular ischemic change. Vascular: No hyperdense vessel. Mild vascular calcification in the carotid siphon regions as well as the distal basilar. Skull: Normal. Negative for fracture or focal lesion. Sinuses/Orbits: No acute finding. Other: None.  Compared with prior study, no abnormality is seen ASPECTS [facility] Stroke Program  Early CT Score) - Ganglionic level infarction (caudate, lentiform nuclei, internal capsule, insula, M1-M3 cortex): 7 - Supraganglionic infarction (M4-M6 cortex): 3 Total score (0-10 with 10 being normal): 10 IMPRESSION: 1. Bifrontal atrophy with focal LEFT inferior frontal encephalomalacia, query old trauma. No acute intracranial findings. 2. ASPECTS is 10 A call was placed at the time of interpretation on 03/23/2016 at 6:28 pm to Dr. Grace Isaac. Electronically Signed   By: Elsie Stain M.D.   On: 03/23/2016 18:30    EKG: Independently reviewed.  Sinus tachycardia with rate 105; nonspecific ST changes (Abnormal R-wave progression; Minimal ST depression, lateral leads) with no evidence of acute ischemia  Assessment/Plan Principal Problem:   Chest pain Active Problems:   Occlusion and stenosis of carotid artery   Essential hypertension   Hyperlipidemia   Diplopia   Chest pain -Patient with substernal chest pain that came on at rest and resolved spontaneously after about 45 minutes -1/3 typical symptoms suggestive of noncardiac chest pain.  -CXR unremarkable.   -Initial cardiac troponin negative -EKG not indicative of acute ischemia.   -TIMI risk score is 4; which predicts a 14 days risk of death, recurrent MI, or urgent revascularization of 19.9%.  -Will plan to place in observation status on telemetry to rule out ACS by overnight observation.  -cycle troponin q6h x 3 and repeat EKG in AM -Continue ASA 81 mg daily -morphine given -Continue fish oil -Risk factor stratification with HgbA1c and lipids; will also check TSH and UDS  Diplopia with anisocoria -Concern for CVA, ER physician called a Code Stroke -Neuro consult done -MRI/MRA negative -Carotid dopplers ordered - has h/o 60-79% stenosis but MRI/A seems to indicate nearly occlusive disease; if present, will need to call vascular surgery since her diplopia could have been caused by an unstable carotid plaque and she could be at markedly  increased risk for a severe stroke -Echo -Risk stratification with FLP, A1c; will also check TSH and UDS - as above -ASA daily -PT/OT/ST/Nutrition Consults  HTN -Allow permissive HTN -Treat BP only if >220/120, and then with goal of 15% reduction -Hold CCB and BB and plan to restart in 48-72 hours  HLD -Check FLP -Will plan  to continue Crestor at home dose for now    DVT prophylaxis:  Lovenox  Code Status: DNR - confirmed with patient/family Family Communication: husband present throughout evaluation Disposition Plan:  Home once clinically improved Consults called: Neurology, PT/OT, ST, Nutrition Admission status: It is my clinical opinion that referral for OBSERVATION is reasonable and necessary in this patient based on the above information provided. The aforementioned taken together are felt to place the patient at high risk for further clinical deterioration. However it is anticipated that the patient may be medically stable for discharge from the hospital within 24 to 48 hours.    Jonah Blue MD [facility]  If 7PM-7AM, please contact night-coverage [email or web address] Password [email or web address]  03/24/2016, 2:18 AM

## 2016-03-23 NOTE — ED Provider Notes (Signed)
[facility]-EMERGENCY DEPT Provider Note   CSN: [id number] Arrival date & time: 03/23/16  1758     History   Chief Complaint Chief Complaint  Patient presents with  . Chest Pain  . Blurred Vision    HPI Jennifer [name] is a 76 y.o. female with past medical history significant for CAD status post CABG, hypertension, and prior right-sided carotid endarterectomy who presents with sudden onset of chest pressure, shortness of breath, intermittent headache, and intermittent vertical diplopia. Patient reports that she was sitting at home watching television at 4 PM, approximately 2.5 hours prior to arrival, when she had sudden onset of vertical diplopia. She says it lasted for several seconds, then went away, then return for several seconds. She said that after this happened, she began having chest pressure and tightness. She says this feels similar to her prior cardiac episodes leading to her getting a CABG. She says that she was feeling completely normal before the episode. She does report some associated chills, intermittent headache that is resolved, and some shortness of breath. She says the chest pressure has resolved after she took 4 aspirin. She describes the chest pressure as a squeezing tightness that was a 6 out of 10 in severity. She denies radiation of the symptoms. She denies any fevers, cough, conservation, diarrhea, nausea, vomiting, or dysuria. Patient does say that her blood pressure was elevated around 190 systolic during the episode at home. She denies any history of stroke or TIA but has history of carotid endarterectomy.     HPI  Past Medical History:  Diagnosis Date  . Anemia   . Arthritis   . Blood transfusion    1965  . Carotid artery occlusion   . Coronary artery disease Oct. 2011  . Eyesight diminished Change in eyesight  . GERD (gastroesophageal reflux disease)   . Headache(784.0)   . Hypercholesterolemia   . Hypertension   . Joint pain   . No pertinent past medical  history    HX KIDNEY STONES  . Shortness of breath on exertion     Patient Active Problem List   Diagnosis Date Noted  . 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 Aftercare following surgery of the circulatory system, NEC 12/04/2012  . CABG x 4 2011 11/13/2012  . Essential hypertension 11/13/2012  . Hyperlipidemia 11/13/2012  . History of tobacco abuse 11/13/2012  . Carotid artery stenosis and occlusion 05/31/2011  . Occlusion and stenosis of carotid artery without mention of cerebral infarction 04/12/2011    Past Surgical History:  Procedure Laterality Date  . ABDOMINAL HYSTERECTOMY    . BREAST BIOPSY     BILATERAL  BENIGN   . CARDIAC CATHETERIZATION     2011  DR Algie Coffer AT [facility]  . CARDIOVASCULAR STRESS TEST     2012 DR [facility] ALSO ECHO    . CAROTID ARTERY - SUBCLAVIAN ARTERY BYPASS GRAFT    . CAROTID ENDARTERECTOMY  05/06/11   Right CEA  . CESAREAN SECTION     X2   . CORONARY ARTERY BYPASS GRAFT     12/2009    . ENDARTERECTOMY  05/06/2011   Procedure: ENDARTERECTOMY CAROTID;  Surgeon: Nada Libman, MD;  Location: [facility] OR;  Service: Vascular;  Laterality: Right;  right carotid artery endarterctomy with vascu guard patch angioplasty   . EYE SURGERY     LASER BIL X2 FOR GLAUCOMA  . NO PAST SURGERIES    .  PR VEIN BYPASS GRAFT,AORTO-FEM-POP      OB History    No data available       Home Medications    Prior to Admission medications   Medication Sig Start Date End Date Taking? Authorizing Provider  ALPHA LIPOIC ACID PO Take 250 mg by mouth daily.    Historical Provider, MD  amLODipine (NORVASC) 10 MG tablet TAKE 1 TABLET BY MOUTH DAILY. 03/11/16   Runell Gess, MD  aspirin EC 81 MG tablet Take 81 mg by mouth every other day.    Historical Provider, MD  cholecalciferol (VITAMIN D) 1000 UNITS tablet Take 1,000 Units by mouth 3 (three) times a week.    Historical Provider, MD  co-enzyme Q-10 50 MG capsule Take 100  mg by mouth daily.    Historical Provider, MD  Flaxseed, Linseed, 1000 MG CAPS Take 1 capsule by mouth daily.     Historical Provider, MD  Garlic (GARLIQUE PO) Take 600 mg by mouth daily.     Historical Provider, MD  Magnesium 250 MG TABS Take 1 tablet by mouth daily.    Historical Provider, MD  metoprolol succinate (TOPROL-XL) 25 MG 24 hr tablet TAKE 1/2 TABLET BY MOUTH ONCE A DAY 11/25/15   Runell Gess, MD  Multiple Vitamin (MULITIVITAMIN WITH MINERALS) TABS Take 1 tablet by mouth daily.    Historical Provider, MD  Omega-3 Fatty Acids (FISH OIL) 1000 MG CAPS Take 1,000 mg by mouth daily. Mega Krill Fish oil    Historical Provider, MD  rosuvastatin (CRESTOR) 10 MG tablet Take 10 mg by mouth 3 (three) times a week. On Monday, Wednesday, and Friday     Historical Provider, MD  vitamin B-12 (CYANOCOBALAMIN) 500 MCG tablet Take 500 mcg by mouth daily.    Historical Provider, MD  Vitamin Mixture (ESTER-C) 500-60 MG TABS Take 500 mg by mouth daily.    Historical Provider, MD    Family History Family History  Problem Relation Age of Onset  . Diabetes Mother     Late onset  . Hyperlipidemia Mother   . Hypertension Mother   . Heart attack Mother   . Diabetes Sister     Late onset  . Hyperlipidemia Sister   . Hypertension Sister     AAA  . Cancer Brother     Prostate  . Heart disease Brother   . Hypertension Brother   . Cancer Brother     Prostate     Social History Social History  Substance Use Topics  . Smoking status: Former Smoker    Quit date: 12/16/2009  . Smokeless tobacco: Never Used  . Alcohol use No     Comment: VERY LITTLE     Allergies   Statins and Penicillins   Review of Systems Review of Systems  Constitutional: Positive for chills. Negative for diaphoresis, fatigue and fever.  HENT: Negative for congestion and rhinorrhea.   Eyes: Positive for visual disturbance. Negative for photophobia, pain and discharge.  Respiratory: Positive for shortness of  breath. Negative for cough, chest tightness, wheezing and stridor.   Cardiovascular: Positive for chest pain. Negative for palpitations and leg swelling.  Gastrointestinal: Negative for constipation, diarrhea, nausea and vomiting.  Genitourinary: Negative for dysuria and flank pain.  Musculoskeletal: Negative for arthralgias, neck pain and neck stiffness.  Skin: Negative for wound.  Neurological: Positive for headaches. Negative for weakness, light-headedness and numbness.  Psychiatric/Behavioral: Negative for agitation.  All other systems reviewed and are negative.    Physical  Exam Updated Vital Signs BP (!) 178/102 (BP Location: Left Arm)   Pulse 112   Resp 20   SpO2 100%   Physical Exam  Constitutional: She is oriented to person, place, and time. She appears well-developed and well-nourished. No distress.  HENT:  Head: Normocephalic and atraumatic.  Mouth/Throat: Oropharynx is clear and moist. No oropharyngeal exudate.  Eyes: Conjunctivae and EOM are normal. Right eye exhibits normal extraocular motion and no nystagmus. Left eye exhibits normal extraocular motion and no nystagmus. Pupils are unequal.    Neck: Normal range of motion. Neck supple. No tracheal deviation present.  Cardiovascular: Normal rate and regular rhythm.   No murmur heard. Pulmonary/Chest: Effort normal and breath sounds normal. No respiratory distress. She has no wheezes. She has no rales. She exhibits no tenderness.  Abdominal: Soft. There is no tenderness.  Musculoskeletal: She exhibits no edema or tenderness.  Neurological: She is alert and oriented to person, place, and time. She is not disoriented. She displays no tremor and normal reflexes. A cranial nerve deficit is present. No sensory deficit. She exhibits normal muscle tone. She displays no seizure activity. Coordination normal. GCS eye subscore is 4. GCS verbal subscore is 5. GCS motor subscore is 6.  Skin: Skin is warm and dry. Capillary refill  takes less than 2 seconds. No rash noted. No erythema.  Psychiatric: She has a normal mood and affect.  Nursing note and vitals reviewed.    ED Treatments / Results  Labs (all labs ordered are listed, but only abnormal results are displayed) Labs Reviewed  COMPREHENSIVE METABOLIC PANEL - Abnormal; Notable for the following:       Result Value   Glucose, Bld 113 (*)    GFR calc non Af Amer 55 (*)    All other components within normal limits  CBG MONITORING, ED - Abnormal; Notable for the following:    Glucose-Capillary 116 (*)    All other components within normal limits  I-STAT CHEM 8, ED - Abnormal; Notable for the following:    Glucose, Bld 118 (*)    All other components within normal limits  PROTIME-INR  APTT  CBC  DIFFERENTIAL  I-STAT TROPOININ, ED  I-STAT TROPOININ, ED    EKG  EKG Interpretation None       Radiology Mr Maxine GlennMra Head Wo Contrast  Result Date: 03/23/2016 CLINICAL DATA:  Three episodes of blurred vision, now resolved. EXAM: MRI HEAD WITHOUT AND WITH CONTRAST AND MRA HEAD WITHOUT AND WITH CONTRAST AND MRI NECK WITHOUT AND WITH CONTRAST TECHNIQUE: Multiplanar, multiecho pulse sequences of the brain and surrounding structures were obtained without and with intravenous contrast. Angiographic images of the head were obtained using MRA technique without contrast. Multiplanar, multiecho pulse sequences of the neck and surrounding structures were obtained without and with intravenous contrast. CONTRAST:  15mL MULTIHANCE GADOBENATE DIMEGLUMINE 529 MG/ML IV SOLN COMPARISON:  CT head earlier today. FINDINGS: MRI HEAD FINDINGS Brain: No evidence for acute infarction, hemorrhage, mass lesion, hydrocephalus, or extra-axial fluid. Bifrontal atrophy. Focal inferior LEFT frontal encephalomalacia. Chronic blood products in this region along with gliosis suggest previous remote head trauma. Mild T2 and FLAIR hyperintensities in the periventricular and subcortical white matter  suggests superimposed chronic microvascular ischemic change. Post infusion, no abnormal enhancement of the brain or meninges. Vascular: Normal flow voids. Skull and upper cervical spine: Normal marrow signal. Sinuses/Orbits: Negative. Other: None. MRA HEAD FINDINGS The internal carotid arteries are widely patent. The basilar artery is widely patent. Both vertebrals contribute to  its formation, RIGHT being slightly larger. Hypoplastic LEFT A1 anterior cerebral artery. No intracranial stenosis or aneurysm. MRA NECK FINDINGS Dolichoectatic great vessels, originated in normal branching pattern from the arch. Prior RIGHT carotid endarterectomy, with venous patch graft appears patulous but widely patent. At the LEFT carotid bifurcation, there is a high-grade stenosis of the LEFT internal carotid artery and distal common carotid artery extending into the LEFT external carotid artery. Estimated stenosis is 75-90%, possibly greater. RIGHT vertebral dominant and widely patent. LEFT vertebral ostial stenosis estimated 50-75%. Subclavian stenosis just distal to the vertebral origin estimated 50%. IMPRESSION: No acute intracranial findings. Atrophy and small vessel disease, suspected remote LEFT inferior frontal trauma, but no evidence for acute stroke. No abnormal postcontrast enhancement. Unremarkable MR angiography of the intracranial circulation. High-grade stenosis of the LEFT carotid bifurcation affecting the distal common carotid artery and origin of the LEFT internal and external carotid artery. Stenosis is 75-90%, possibly greater. Correlate clinically as a cause of blurred vision. Electronically Signed   By: Elsie Stain M.D.   On: 03/23/2016 20:45   Mr Angiogram Neck W Or Wo Contrast  Result Date: 03/23/2016 CLINICAL DATA:  Three episodes of blurred vision, now resolved. EXAM: MRI HEAD WITHOUT AND WITH CONTRAST AND MRA HEAD WITHOUT AND WITH CONTRAST AND MRI NECK WITHOUT AND WITH CONTRAST TECHNIQUE: Multiplanar,  multiecho pulse sequences of the brain and surrounding structures were obtained without and with intravenous contrast. Angiographic images of the head were obtained using MRA technique without contrast. Multiplanar, multiecho pulse sequences of the neck and surrounding structures were obtained without and with intravenous contrast. CONTRAST:  15mL MULTIHANCE GADOBENATE DIMEGLUMINE 529 MG/ML IV SOLN COMPARISON:  CT head earlier today. FINDINGS: MRI HEAD FINDINGS Brain: No evidence for acute infarction, hemorrhage, mass lesion, hydrocephalus, or extra-axial fluid. Bifrontal atrophy. Focal inferior LEFT frontal encephalomalacia. Chronic blood products in this region along with gliosis suggest previous remote head trauma. Mild T2 and FLAIR hyperintensities in the periventricular and subcortical white matter suggests superimposed chronic microvascular ischemic change. Post infusion, no abnormal enhancement of the brain or meninges. Vascular: Normal flow voids. Skull and upper cervical spine: Normal marrow signal. Sinuses/Orbits: Negative. Other: None. MRA HEAD FINDINGS The internal carotid arteries are widely patent. The basilar artery is widely patent. Both vertebrals contribute to its formation, RIGHT being slightly larger. Hypoplastic LEFT A1 anterior cerebral artery. No intracranial stenosis or aneurysm. MRA NECK FINDINGS Dolichoectatic great vessels, originated in normal branching pattern from the arch. Prior RIGHT carotid endarterectomy, with venous patch graft appears patulous but widely patent. At the LEFT carotid bifurcation, there is a high-grade stenosis of the LEFT internal carotid artery and distal common carotid artery extending into the LEFT external carotid artery. Estimated stenosis is 75-90%, possibly greater. RIGHT vertebral dominant and widely patent. LEFT vertebral ostial stenosis estimated 50-75%. Subclavian stenosis just distal to the vertebral origin estimated 50%. IMPRESSION: No acute  intracranial findings. Atrophy and small vessel disease, suspected remote LEFT inferior frontal trauma, but no evidence for acute stroke. No abnormal postcontrast enhancement. Unremarkable MR angiography of the intracranial circulation. High-grade stenosis of the LEFT carotid bifurcation affecting the distal common carotid artery and origin of the LEFT internal and external carotid artery. Stenosis is 75-90%, possibly greater. Correlate clinically as a cause of blurred vision. Electronically Signed   By: Elsie Stain M.D.   On: 03/23/2016 20:45   Mr Laqueta Jean ZO Contrast  Result Date: 03/23/2016 CLINICAL DATA:  Three episodes of blurred vision, now resolved. EXAM: MRI  HEAD WITHOUT AND WITH CONTRAST AND MRA HEAD WITHOUT AND WITH CONTRAST AND MRI NECK WITHOUT AND WITH CONTRAST TECHNIQUE: Multiplanar, multiecho pulse sequences of the brain and surrounding structures were obtained without and with intravenous contrast. Angiographic images of the head were obtained using MRA technique without contrast. Multiplanar, multiecho pulse sequences of the neck and surrounding structures were obtained without and with intravenous contrast. CONTRAST:  15mL MULTIHANCE GADOBENATE DIMEGLUMINE 529 MG/ML IV SOLN COMPARISON:  CT head earlier today. FINDINGS: MRI HEAD FINDINGS Brain: No evidence for acute infarction, hemorrhage, mass lesion, hydrocephalus, or extra-axial fluid. Bifrontal atrophy. Focal inferior LEFT frontal encephalomalacia. Chronic blood products in this region along with gliosis suggest previous remote head trauma. Mild T2 and FLAIR hyperintensities in the periventricular and subcortical white matter suggests superimposed chronic microvascular ischemic change. Post infusion, no abnormal enhancement of the brain or meninges. Vascular: Normal flow voids. Skull and upper cervical spine: Normal marrow signal. Sinuses/Orbits: Negative. Other: None. MRA HEAD FINDINGS The internal carotid arteries are widely patent. The  basilar artery is widely patent. Both vertebrals contribute to its formation, RIGHT being slightly larger. Hypoplastic LEFT A1 anterior cerebral artery. No intracranial stenosis or aneurysm. MRA NECK FINDINGS Dolichoectatic great vessels, originated in normal branching pattern from the arch. Prior RIGHT carotid endarterectomy, with venous patch graft appears patulous but widely patent. At the LEFT carotid bifurcation, there is a high-grade stenosis of the LEFT internal carotid artery and distal common carotid artery extending into the LEFT external carotid artery. Estimated stenosis is 75-90%, possibly greater. RIGHT vertebral dominant and widely patent. LEFT vertebral ostial stenosis estimated 50-75%. Subclavian stenosis just distal to the vertebral origin estimated 50%. IMPRESSION: No acute intracranial findings. Atrophy and small vessel disease, suspected remote LEFT inferior frontal trauma, but no evidence for acute stroke. No abnormal postcontrast enhancement. Unremarkable MR angiography of the intracranial circulation. High-grade stenosis of the LEFT carotid bifurcation affecting the distal common carotid artery and origin of the LEFT internal and external carotid artery. Stenosis is 75-90%, possibly greater. Correlate clinically as a cause of blurred vision. Electronically Signed   By: Elsie Stain M.D.   On: 03/23/2016 20:45   Ct Head Code Stroke W/o Cm  Result Date: 03/23/2016 CLINICAL DATA:  Code stroke. Vertical diplopia, dilated RIGHT pupil. EXAM: CT HEAD WITHOUT CONTRAST TECHNIQUE: Contiguous axial images were obtained from the base of the skull through the vertex without intravenous contrast. COMPARISON:  02/28/2010 CTA head. FINDINGS: Brain: No evidence of acute infarction, hemorrhage, hydrocephalus, extra-axial collection or mass lesion/mass effect. Mild atrophy, particularly in the bifrontal regions. There is focal encephalomalacia in the inferior LEFT frontal lobe. This could relate to an old  trauma. Hypoattenuation of white matter suggestive of chronic microvascular ischemic change. Vascular: No hyperdense vessel. Mild vascular calcification in the carotid siphon regions as well as the distal basilar. Skull: Normal. Negative for fracture or focal lesion. Sinuses/Orbits: No acute finding. Other: None.  Compared with prior study, no abnormality is seen ASPECTS [facility] Stroke Program Early CT Score) - Ganglionic level infarction (caudate, lentiform nuclei, internal capsule, insula, M1-M3 cortex): 7 - Supraganglionic infarction (M4-M6 cortex): 3 Total score (0-10 with 10 being normal): 10 IMPRESSION: 1. Bifrontal atrophy with focal LEFT inferior frontal encephalomalacia, query old trauma. No acute intracranial findings. 2. ASPECTS is 10 A call was placed at the time of interpretation on 03/23/2016 at 6:28 pm to Dr. Grace Isaac. Electronically Signed   By: Elsie Stain M.D.   On: 03/23/2016 18:30    Procedures Procedures (  including critical care time)  CRITICAL CARE Performed by: Canary Brim Tegeler Total critical care time: 35 minutes Critical care time was exclusive of separately billable procedures and treating other patients. Critical care was necessary to treat or prevent imminent or life-threatening deterioration. Critical care was time spent personally by me on the following activities: development of treatment plan with patient and/or surrogate as well as nursing, discussions with consultants, evaluation of patient's response to treatment, examination of patient, obtaining history from patient or surrogate, ordering and performing treatments and interventions, ordering and review of laboratory studies, ordering and review of radiographic studies, pulse oximetry and re-evaluation of patient's condition.   Medications Ordered in ED Medications  gadobenate dimeglumine (MULTIHANCE) injection 15 mL (15 mLs Intravenous Contrast Given 03/23/16 2018)     Initial Impression / Assessment and  Plan / ED Course  I have reviewed the triage vital signs and the nursing notes.  Pertinent labs & imaging results that were available during my care of the patient were reviewed by me and considered in my medical decision making (see chart for details).     Jennifer [name] is a 76 y.o. female with past medical history significant for CAD status post CABG, hypertension, and prior right-sided carotid endarterectomy who presents with sudden onset of chest pressure, shortness of breath, intermittent headache, and intermittent vertical diplopia.  History and exam are seen above.   On exam, patient found to have in his coria with a difference in right versus left pupil. The right pupil was approximately 2 mm larger than the left at 4 mm and 2 mm respectively. Patient had no facial droop. Patient had no other focal neurologic deficits. Patient denied eye pain and also denied any blurriness of vision in the right eye. Patient had no chest tenderness and lungs were clear. Abdomen was nontender. Patient had symmetric pulses in extremities. No significant extremity edema. Next  After discovery of sudden onset neurologic deficit and the persistent anisocoria which the patient says is new, code stroke was called. Next  Patient quickly taken to CT scanner where workup was initiated. Neurology team came to the bedside and did not feel patient had evidence of stroke on CT imaging. They recommended MRI/MRA of the head and neck to continue her workup.  Given the patient's report of chest pressure similar to prior and with her history of CAD with CABG, patient will need admission for high risk chest pain. Anticipate following up on MRI results.   RI results showed no evidence of acute stroke but did show evidence of stenosis of the left carotid artery. Patient reports that she knows about this and is being worked up for it.  Given the lack of acute stroke, neurology recommended other workup and admission. Given her  high risk chest pain and history of CAD, patient will be admitted for further management.  Hospitalist team called and they will admit her for further workup of her chest pain and for continuing her workup of the transient diplopia.    Final Clinical Impressions(s) / ED Diagnoses   Final diagnoses:  Anisocoria  Chest pain, unspecified type     Clinical Impression: 1. Chest pain, unspecified type   2. Diplopia   3. Anisocoria     Disposition: Admit to Hospitalist service    Heide Scales, MD 03/24/16 1116

## 2016-03-23 NOTE — ED Notes (Signed)
Requested hospital bed 

## 2016-03-23 NOTE — ED Notes (Signed)
Patient transported to CT with RN, MD and Neurology.  Phlebotomy and neurology now at bedside with patient in room.

## 2016-03-23 NOTE — ED Notes (Signed)
Admitting at bedside 

## 2016-03-23 NOTE — ED Notes (Signed)
Patient at MRI 

## 2016-03-24 ENCOUNTER — Observation Stay (HOSPITAL_BASED_OUTPATIENT_CLINIC_OR_DEPARTMENT_OTHER): Payer: Medicare Other

## 2016-03-24 ENCOUNTER — Observation Stay (HOSPITAL_COMMUNITY): Payer: Medicare Other

## 2016-03-24 DIAGNOSIS — I6522 Occlusion and stenosis of left carotid artery: Secondary | ICD-10-CM | POA: Diagnosis not present

## 2016-03-24 DIAGNOSIS — I10 Essential (primary) hypertension: Secondary | ICD-10-CM | POA: Diagnosis not present

## 2016-03-24 DIAGNOSIS — I25119 Atherosclerotic heart disease of native coronary artery with unspecified angina pectoris: Secondary | ICD-10-CM | POA: Diagnosis not present

## 2016-03-24 DIAGNOSIS — R000 Tachycardia, unspecified: Secondary | ICD-10-CM

## 2016-03-24 DIAGNOSIS — R079 Chest pain, unspecified: Secondary | ICD-10-CM

## 2016-03-24 DIAGNOSIS — H532 Diplopia: Secondary | ICD-10-CM | POA: Diagnosis present

## 2016-03-24 LAB — TROPONIN I: Troponin I: <0.03 ng/mL (ref <0.03)

## 2016-03-24 LAB — TSH: TSH: 1.966 u[IU]/mL (ref 0.350–4.500)

## 2016-03-24 LAB — RAPID URINE DRUG SCREEN, HOSP PERFORMED
Amphetamines: NOT DETECTED
BARBITURATES: NOT DETECTED
Benzodiazepines: NOT DETECTED
Cocaine: NOT DETECTED
Opiates: NOT DETECTED
Tetrahydrocannabinol: NOT DETECTED

## 2016-03-24 LAB — NM MYOCAR MULTI W/SPECT W/WALL MOTION / EF
CSEPED: 0 min
CSEPHR: 82 %
CSEPPHR: 121 {beats}/min
Estimated workload: 1 METS
Exercise duration (sec): 0 s
MPHR: 147 {beats}/min
Rest HR: 99 {beats}/min

## 2016-03-24 LAB — LIPID PANEL
CHOL/HDL RATIO: 3.3 ratio
CHOLESTEROL: 164 mg/dL (ref 0–200)
HDL: 49 mg/dL (ref >40)
LDL Cholesterol: 103 mg/dL — ABNORMAL HIGH (ref 0–99)
Triglycerides: 62 mg/dL (ref <150)
VLDL: 12 mg/dL (ref 0–40)

## 2016-03-24 MED ORDER — REGADENOSON 0.4 MG/5ML IV SOLN
INTRAVENOUS | Status: AC
  Administered 2016-03-24: 13:00:00
  Filled 2016-03-24: qty 5

## 2016-03-24 MED ORDER — ACETAMINOPHEN 160 MG/5ML PO SOLN: 650.0000 mg | ORAL | Status: DC | PRN

## 2016-03-24 MED ORDER — ACETAMINOPHEN 650 MG RE SUPP: 650.0000 mg | RECTAL | Status: DC | PRN

## 2016-03-24 MED ORDER — ENOXAPARIN SODIUM 40 MG/0.4ML ~~LOC~~ SOLN
40.0000 mg | SUBCUTANEOUS | Status: DC
  Administered 2016-03-24: 40 mg via SUBCUTANEOUS
  Filled 2016-03-24 (×2): qty 0.4

## 2016-03-24 MED ORDER — TECHNETIUM TC 99M TETROFOSMIN IV KIT
30.0000 | PACK | Freq: Once | INTRAVENOUS | Status: AC | PRN
  Administered 2016-03-24: 30 via INTRAVENOUS

## 2016-03-24 MED ORDER — ACETAMINOPHEN 325 MG PO TABS
650.0000 mg | ORAL_TABLET | ORAL | Status: DC | PRN
  Administered 2016-03-24: 650 mg via ORAL
  Filled 2016-03-24: qty 2

## 2016-03-24 MED ORDER — ROSUVASTATIN CALCIUM 10 MG PO TABS
10.0000 mg | ORAL_TABLET | ORAL | Status: DC
  Administered 2016-03-24: 10 mg via ORAL
  Filled 2016-03-24: qty 1

## 2016-03-24 MED ORDER — POLYVINYL ALCOHOL 1.4 % OP SOLN: 1.0000 [drp] | Freq: Every day | OPHTHALMIC | Status: DC | PRN

## 2016-03-24 MED ORDER — CLOPIDOGREL BISULFATE 75 MG PO TABS: 75.0000 mg | ORAL_TABLET | Freq: Every day | ORAL | 0 refills | Status: DC

## 2016-03-24 MED ORDER — SODIUM CHLORIDE 0.9 % IV SOLN: INTRAVENOUS | Status: DC

## 2016-03-24 MED ORDER — POLYETHYL GLYCOL-PROPYL GLYCOL 0.4-0.3 % OP GEL: Freq: Every day | OPHTHALMIC | Status: DC | PRN

## 2016-03-24 MED ORDER — ASPIRIN EC 81 MG PO TBEC
81.0000 mg | DELAYED_RELEASE_TABLET | Freq: Every day | ORAL | Status: DC
  Administered 2016-03-24: 81 mg via ORAL
  Filled 2016-03-24: qty 1

## 2016-03-24 MED ORDER — TECHNETIUM TC 99M TETROFOSMIN IV KIT
10.0000 | PACK | Freq: Once | INTRAVENOUS | Status: AC | PRN
  Administered 2016-03-24: 10 via INTRAVENOUS

## 2016-03-24 MED ORDER — SENNOSIDES-DOCUSATE SODIUM 8.6-50 MG PO TABS
1.0000 | ORAL_TABLET | Freq: Every evening | ORAL | Status: DC | PRN
  Filled 2016-03-24: qty 1

## 2016-03-24 MED ORDER — REGADENOSON 0.4 MG/5ML IV SOLN
0.4000 mg | Freq: Once | INTRAVENOUS | Status: DC
  Filled 2016-03-24: qty 5

## 2016-03-24 MED ORDER — STROKE: EARLY STAGES OF RECOVERY BOOK
Freq: Once | Status: DC
  Filled 2016-03-24: qty 1

## 2016-03-24 MED ORDER — METOPROLOL TARTRATE 25 MG PO TABS
25.0000 mg | ORAL_TABLET | Freq: Two times a day (BID) | ORAL | Status: DC
  Administered 2016-03-24: 25 mg via ORAL
  Filled 2016-03-24: qty 1

## 2016-03-24 MED ORDER — OMEGA-3-ACID ETHYL ESTERS 1 G PO CAPS
1.0000 g | ORAL_CAPSULE | Freq: Every day | ORAL | Status: DC
  Administered 2016-03-24: 1 g via ORAL
  Filled 2016-03-24 (×2): qty 1

## 2016-03-24 MED ORDER — CLOPIDOGREL BISULFATE 75 MG PO TABS
75.0000 mg | ORAL_TABLET | Freq: Every day | ORAL | Status: DC
  Administered 2016-03-24: 75 mg via ORAL
  Filled 2016-03-24: qty 1

## 2016-03-24 NOTE — ED Notes (Signed)
Pt made aware of bed assignment 

## 2016-03-24 NOTE — Consult Note (Signed)
CARDIOLOGY CONSULT NOTE   Patient ID: Jennifer [name] MRN: [id number] DOB/AGE: 74/08/1940 74 y.o.  Admit date: 03/23/2016  Requesting Physician: Dr. Jomarie LongsJoseph Primary Physician:   Jennifer PeakNathan Conroy, PA-C Primary Cardiologist:  Dr. Allyson SabalBerry  Reason for Consultation:  Chest pain   HPI: Jennifer [name] is a 76 y.o. female with a history of HTN, HLD, CKD, CAD [name]/p CABG  x4 V (2011) with left carotid to subclavian artery bypass grafting prior to bypass surgery, and R CEA (2013) who [facility] on 03/23/16 with diplopia and chest pain.   She presented to with sudden of acute onset vertical diplopia that self resolved. Neurology consulted who felt transient diplopia concerning for possible TIA. Anisocoria was also noted which can be related to carotid artery dissection vs physiological vs chronic from eye surgeries. Stroke work up initiated.  CT head negative for acute intracranial findings. MRA with no acute intracranial findings but possible old infarct and high grade stenosis on the left carotid bifurcation affecting the distal common carotid artery and origin of the LEFT internal and external carotid artery. Stenosis is 75-90%, possibly greater.  During her episode of blurred vision she became very frightened and her BP "shot up." She also has some associated chest tightness. Rated 6/10 that resolved when her BP went down. She said this was different than the indigestion like pain she had prior to bypass surgery. No associated SOB, diaphoresis or nausea. She thinks it was related to anxiety. She is very active around her house and yard (raking, vacuuming) and never gets exertional SOB/CP. Currently chest pain free.      Past Medical History:  Diagnosis Date  . Anemia   . Arthritis   . Blood transfusion    1965  . Carotid artery occlusion   . Coronary artery disease Oct. 2011  . Eyesight diminished Change in eyesight  . GERD (gastroesophageal reflux disease)   . Headache(784.0)   .  Hypercholesterolemia   . Hypertension   . Joint pain   . Shortness of breath on exertion      Past Surgical History:  Procedure Laterality Date  . ABDOMINAL HYSTERECTOMY    . BREAST BIOPSY     BILATERAL  BENIGN   . CARDIAC CATHETERIZATION     2011  [facility]  . CARDIOVASCULAR STRESS TEST     2012 [facility]SO E[facility]    . CAROTID ARTERY - SUBCLAVIAN ARTERY BYPASS GRAFT    . CESAREAN SECTION     X2   . CORONARY ARTERY BYPASS GRAFT     12/2009, x 4 plus 3 in left subclavian  . ENDARTERECTOMY  05/06/2011   Procedure: ENDARTERECTOMY CAROTID;  Surgeon: Nada LibmanVance W Brabham, MD;  [facility];  Service: Vascular;  Laterality: Right;  right carotid artery endarterctomy with vascu guard patch angioplasty   . EYE SURGERY     LASER BIL X2 FOR GLAUCOMA  . PR VEIN BYPASS GRAFT,AORTO-FEM-POP      Allergies  Allergen Reactions  . Statins Anaphylaxis    High dose ( Crestor 20 mg , pt cannot take)  . Penicillins Hives and Rash    Has patient had a PCN reaction causing immediate rash, facial/tongue/throat swelling, SOB or lightheadedness with hypotension:YES Has patient had a PCN reaction causing severe rash involving mucus membranes or skin necrosis: NO Has patient had a PCN reaction that required hospitalization NO Has patient had a PCN reaction occurring within the last 10 years:  NO If all of the above answers are "NO", then may proceed with Cephalosporin use.    I have reviewed the patient'[name] current medications .  stroke: mapping our early stages of recovery book   Does not apply Once  . aspirin EC  81 mg Oral Daily  . enoxaparin (LOVENOX) injection  40 mg Subcutaneous Q24H  . omega-3 acid ethyl esters  1 g Oral Daily  . rosuvastatin  10 mg Oral Q M,W,F-1800   . sodium chloride     acetaminophen **OR** acetaminophen (TYLENOL) oral liquid 160 mg/5 mL **OR** acetaminophen, polyvinyl alcohol, senna-docusate  Prior to Admission medications   Medication Sig Start Date End Date  Taking? Authorizing Provider  ALPHA LIPOIC ACID PO Take 250 mg by mouth daily.   Yes Historical Provider, MD  amLODipine (NORVASC) 10 MG tablet TAKE 1 TABLET BY MOUTH DAILY. 03/11/16  Yes Runell Gess, MD  aspirin EC 81 MG tablet Take 81 mg by mouth daily.    Yes Historical Provider, MD  cholecalciferol (VITAMIN D) 1000 UNITS tablet Take 1,000 Units by mouth daily.    Yes Historical Provider, MD  co-enzyme Q-10 50 MG capsule Take 100 mg by mouth daily.   Yes Historical Provider, MD  Flaxseed, Linseed, 1000 MG CAPS Take 1 capsule by mouth daily.    Yes Historical Provider, MD  Garlic (GARLIQUE PO) Take 600 mg by mouth daily.    Yes Historical Provider, MD  Magnesium 250 MG TABS Take 1 tablet by mouth daily.   Yes Historical Provider, MD  metoprolol succinate (TOPROL-XL) 25 MG 24 hr tablet TAKE 1/2 TABLET BY MOUTH ONCE A DAY 11/25/15  Yes Runell Gess, MD  Multiple Vitamin (MULITIVITAMIN WITH MINERALS) TABS Take 1 tablet by mouth daily.   Yes Historical Provider, MD  Omega-3 Fatty Acids (FISH OIL) 1000 MG CAPS Take 1,000 mg by mouth daily. Mega Krill Fish oil   Yes Historical Provider, MD  Polyethyl Glycol-Propyl Glycol (SYSTANE OP) Place 1 drop into both eyes daily as needed. For dryt eyes   Yes Historical Provider, MD  rosuvastatin (CRESTOR) 10 MG tablet Take 10 mg by mouth 3 (three) times a week. On Monday, Wednesday, and Friday    Yes Historical Provider, MD  vitamin B-12 (CYANOCOBALAMIN) 500 MCG tablet Take 500 mcg by mouth daily.   Yes Historical Provider, MD  Vitamin Mixture (ESTER-C) 500-60 MG TABS Take 500 mg by mouth daily.   Yes Historical Provider, MD     Social History   Social History  . Marital status: Married    Spouse name: N/A  . Number of children: N/A  . Years of education: N/A   Occupational History  . retired    Social History Main Topics  . Smoking status: Former Smoker    Years: 25.00    Quit date: 12/16/2009  . Smokeless tobacco: Never Used  . Alcohol  use Yes     Comment: some  . Drug use: No  . Sexual activity: Not on file   Other Topics Concern  . Not on file   Social History Narrative  . No narrative on file    Family Status  Relation Status  . Mother Deceased at age 61  . Sister Deceased  . Brother Deceased  . Brother Alive  . Father Deceased at age 44  . Sister Alive   Family History  Problem Relation Age of Onset  . Diabetes Mother     Late onset  . Hyperlipidemia  Mother   . Hypertension Mother   . Heart attack Mother   . Diabetes Sister     Late onset  . Hyperlipidemia Sister   . Hypertension Sister     AAA  . Cancer Brother     Prostate  . Heart disease Brother   . Hypertension Brother   . Cancer Brother     Prostate     ROS:  Full 14 point review of systems complete and found to be negative unless listed above.  Physical Exam: Blood pressure (!) 167/94, pulse (!) 102, temperature 98.6 F (37 C), temperature source Oral, resp. rate 18, height 5\' 3"  (1.6 m), weight 148 lb 12.8 oz (67.5 kg), SpO2 98 %.  General: Well developed, well nourished, female in no acute distress Head: Eyes PERRLA, No xanthomas.   Normocephalic and atraumatic, oropharynx without edema or exudate.  Lungs: CTAB  Heart: HRRR S1 S2, no rub/gallop, Heart regular rate and rhythm with S1, S2. No murmur. pulses are 2+ extrem.   Neck: No carotid bruits. No lymphadenopathy.  No JVD. Abdomen: Bowel sounds present, abdomen soft and non-tender without masses or hernias noted. Msk:  No spine or cva tenderness. No weakness, no joint deformities or effusions. Extremities: No clubbing or cyanosis. No LE edema.  Neuro: Alert and oriented X 3. No focal deficits noted. Psych:  Good affect, responds appropriately Skin: No rashes or lesions noted.  Labs:   Lab Results  Component Value Date   WBC 7.8 03/23/2016   HGB 13.9 03/23/2016   HCT 41.0 03/23/2016   MCV 92.4 03/23/2016   PLT 239 03/23/2016    Recent Labs  03/23/16 1831  INR 0.96     Recent Labs Lab 03/23/16 1831 03/23/16 1846  NA 138 141  K 3.8 3.7  CL 104 105  CO2 22  --   BUN 13 15  CREATININE 1.00 1.00  CALCIUM 10.2  --   PROT 7.8  --   BILITOT 0.6  --   ALKPHOS 60  --   ALT 19  --   AST 33  --   GLUCOSE 113* 118*  ALBUMIN 4.6  --    Magnesium  Date Value Ref Range Status  12/31/2009 2.1 1.5 - 2.5 mg/dL Final    Recent Labs  16/10/96 0321  TROPONINI <0.03    Recent Labs  03/23/16 1844 03/23/16 2107  TROPIPOC 0.00 0.00   No results found for: PROBNP Lab Results  Component Value Date   CHOL 164 03/24/2016   HDL 49 03/24/2016   LDLCALC 103 (H) 03/24/2016   TRIG 62 03/24/2016   No results found for: DDIMER No results found for: LIPASE, AMYLASE TSH  Date/Time Value Ref Range Status  03/24/2016 03:15 AM 1.966 0.350 - 4.500 uIU/mL Final    Comment:    Performed by a 3rd Generation assay with a functional sensitivity of <=0.01 uIU/mL.   No results found for: VITAMINB12, FOLATE, FERRITIN, TIBC, IRON, RETICCTPCT  Echo: none   ECG:  105 sinus tachycardia. Low voltage, precordial leads. Abnormal R-wave progression, early transition   Radiology:  Mr Shirlee Latch EA Contrast  Result Date: 03/23/2016 CLINICAL DATA:  Three episodes of blurred vision, now resolved. EXAM: MRI HEAD WITHOUT AND WITH CONTRAST AND MRA HEAD WITHOUT AND WITH CONTRAST AND MRI NECK WITHOUT AND WITH CONTRAST TECHNIQUE: Multiplanar, multiecho pulse sequences of the brain and surrounding structures were obtained without and with intravenous contrast. Angiographic images of the head were obtained using MRA technique  without contrast. Multiplanar, multiecho pulse sequences of the neck and surrounding structures were obtained without and with intravenous contrast. CONTRAST:  15mL MULTIHANCE GADOBENATE DIMEGLUMINE 529 MG/ML IV SOLN COMPARISON:  CT head earlier today. FINDINGS: MRI HEAD FINDINGS Brain: No evidence for acute infarction, hemorrhage, mass lesion, hydrocephalus, or  extra-axial fluid. Bifrontal atrophy. Focal inferior LEFT frontal encephalomalacia. Chronic blood products in this region along with gliosis suggest previous remote head trauma. Mild T2 and FLAIR hyperintensities in the periventricular and subcortical white matter suggests superimposed chronic microvascular ischemic change. Post infusion, no abnormal enhancement of the brain or meninges. Vascular: Normal flow voids. Skull and upper cervical spine: Normal marrow signal. Sinuses/Orbits: Negative. Other: None. MRA HEAD FINDINGS The internal carotid arteries are widely patent. The basilar artery is widely patent. Both vertebrals contribute to its formation, RIGHT being slightly larger. Hypoplastic LEFT A1 anterior cerebral artery. No intracranial stenosis or aneurysm. MRA NECK FINDINGS Dolichoectatic great vessels, originated in normal branching pattern from the arch. Prior RIGHT carotid endarterectomy, with venous patch graft appears patulous but widely patent. At the LEFT carotid bifurcation, there is a high-grade stenosis of the LEFT internal carotid artery and distal common carotid artery extending into the LEFT external carotid artery. Estimated stenosis is 75-90%, possibly greater. RIGHT vertebral dominant and widely patent. LEFT vertebral ostial stenosis estimated 50-75%. Subclavian stenosis just distal to the vertebral origin estimated 50%. IMPRESSION: No acute intracranial findings. Atrophy and small vessel disease, suspected remote LEFT inferior frontal trauma, but no evidence for acute stroke. No abnormal postcontrast enhancement. Unremarkable MR angiography of the intracranial circulation. High-grade stenosis of the LEFT carotid bifurcation affecting the distal common carotid artery and origin of the LEFT internal and external carotid artery. Stenosis is 75-90%, possibly greater. Correlate clinically as a cause of blurred vision. Electronically Signed   By: Elsie Stain M.D.   On: 03/23/2016 20:45   Mr  Angiogram Neck W Or Wo Contrast  Result Date: 03/23/2016 CLINICAL DATA:  Three episodes of blurred vision, now resolved. EXAM: MRI HEAD WITHOUT AND WITH CONTRAST AND MRA HEAD WITHOUT AND WITH CONTRAST AND MRI NECK WITHOUT AND WITH CONTRAST TECHNIQUE: Multiplanar, multiecho pulse sequences of the brain and surrounding structures were obtained without and with intravenous contrast. Angiographic images of the head were obtained using MRA technique without contrast. Multiplanar, multiecho pulse sequences of the neck and surrounding structures were obtained without and with intravenous contrast. CONTRAST:  15mL MULTIHANCE GADOBENATE DIMEGLUMINE 529 MG/ML IV SOLN COMPARISON:  CT head earlier today. FINDINGS: MRI HEAD FINDINGS Brain: No evidence for acute infarction, hemorrhage, mass lesion, hydrocephalus, or extra-axial fluid. Bifrontal atrophy. Focal inferior LEFT frontal encephalomalacia. Chronic blood products in this region along with gliosis suggest previous remote head trauma. Mild T2 and FLAIR hyperintensities in the periventricular and subcortical white matter suggests superimposed chronic microvascular ischemic change. Post infusion, no abnormal enhancement of the brain or meninges. Vascular: Normal flow voids. Skull and upper cervical spine: Normal marrow signal. Sinuses/Orbits: Negative. Other: None. MRA HEAD FINDINGS The internal carotid arteries are widely patent. The basilar artery is widely patent. Both vertebrals contribute to its formation, RIGHT being slightly larger. Hypoplastic LEFT A1 anterior cerebral artery. No intracranial stenosis or aneurysm. MRA NECK FINDINGS Dolichoectatic great vessels, originated in normal branching pattern from the arch. Prior RIGHT carotid endarterectomy, with venous patch graft appears patulous but widely patent. At the LEFT carotid bifurcation, there is a high-grade stenosis of the LEFT internal carotid artery and distal common carotid artery extending into the LEFT  external carotid artery. Estimated stenosis is 75-90%, possibly greater. RIGHT vertebral dominant and widely patent. LEFT vertebral ostial stenosis estimated 50-75%. Subclavian stenosis just distal to the vertebral origin estimated 50%. IMPRESSION: No acute intracranial findings. Atrophy and small vessel disease, suspected remote LEFT inferior frontal trauma, but no evidence for acute stroke. No abnormal postcontrast enhancement. Unremarkable MR angiography of the intracranial circulation. High-grade stenosis of the LEFT carotid bifurcation affecting the distal common carotid artery and origin of the LEFT internal and external carotid artery. Stenosis is 75-90%, possibly greater. Correlate clinically as a cause of blurred vision. Electronically Signed   By: Elsie Stain M.D.   On: 03/23/2016 20:45   Mr Laqueta Jean ZO Contrast  Result Date: 03/23/2016 CLINICAL DATA:  Three episodes of blurred vision, now resolved. EXAM: MRI HEAD WITHOUT AND WITH CONTRAST AND MRA HEAD WITHOUT AND WITH CONTRAST AND MRI NECK WITHOUT AND WITH CONTRAST TECHNIQUE: Multiplanar, multiecho pulse sequences of the brain and surrounding structures were obtained without and with intravenous contrast. Angiographic images of the head were obtained using MRA technique without contrast. Multiplanar, multiecho pulse sequences of the neck and surrounding structures were obtained without and with intravenous contrast. CONTRAST:  15mL MULTIHANCE GADOBENATE DIMEGLUMINE 529 MG/ML IV SOLN COMPARISON:  CT head earlier today. FINDINGS: MRI HEAD FINDINGS Brain: No evidence for acute infarction, hemorrhage, mass lesion, hydrocephalus, or extra-axial fluid. Bifrontal atrophy. Focal inferior LEFT frontal encephalomalacia. Chronic blood products in this region along with gliosis suggest previous remote head trauma. Mild T2 and FLAIR hyperintensities in the periventricular and subcortical white matter suggests superimposed chronic microvascular ischemic change.  Post infusion, no abnormal enhancement of the brain or meninges. Vascular: Normal flow voids. Skull and upper cervical spine: Normal marrow signal. Sinuses/Orbits: Negative. Other: None. MRA HEAD FINDINGS The internal carotid arteries are widely patent. The basilar artery is widely patent. Both vertebrals contribute to its formation, RIGHT being slightly larger. Hypoplastic LEFT A1 anterior cerebral artery. No intracranial stenosis or aneurysm. MRA NECK FINDINGS Dolichoectatic great vessels, originated in normal branching pattern from the arch. Prior RIGHT carotid endarterectomy, with venous patch graft appears patulous but widely patent. At the LEFT carotid bifurcation, there is a high-grade stenosis of the LEFT internal carotid artery and distal common carotid artery extending into the LEFT external carotid artery. Estimated stenosis is 75-90%, possibly greater. RIGHT vertebral dominant and widely patent. LEFT vertebral ostial stenosis estimated 50-75%. Subclavian stenosis just distal to the vertebral origin estimated 50%. IMPRESSION: No acute intracranial findings. Atrophy and small vessel disease, suspected remote LEFT inferior frontal trauma, but no evidence for acute stroke. No abnormal postcontrast enhancement. Unremarkable MR angiography of the intracranial circulation. High-grade stenosis of the LEFT carotid bifurcation affecting the distal common carotid artery and origin of the LEFT internal and external carotid artery. Stenosis is 75-90%, possibly greater. Correlate clinically as a cause of blurred vision. Electronically Signed   By: Elsie Stain M.D.   On: 03/23/2016 20:45   Ct Head Code Stroke W/o Cm  Result Date: 03/23/2016 CLINICAL DATA:  Code stroke. Vertical diplopia, dilated RIGHT pupil. EXAM: CT HEAD WITHOUT CONTRAST TECHNIQUE: Contiguous axial images were obtained from the base of the skull through the vertex without intravenous contrast. COMPARISON:  02/28/2010 CTA head. FINDINGS: Brain: No  evidence of acute infarction, hemorrhage, hydrocephalus, extra-axial collection or mass lesion/mass effect. Mild atrophy, particularly in the bifrontal regions. There is focal encephalomalacia in the inferior LEFT frontal lobe. This could relate to an old trauma. Hypoattenuation of white matter suggestive  of chronic microvascular ischemic change. Vascular: No hyperdense vessel. Mild vascular calcification in the carotid siphon regions as well as the distal basilar. Skull: Normal. Negative for fracture or focal lesion. Sinuses/Orbits: No acute finding. Other: None.  Compared with prior study, no abnormality is seen ASPECTS [facility] Stroke Program Early CT Score) - Ganglionic level infarction (caudate, lentiform nuclei, internal capsule, insula, M1-M3 cortex): 7 - Supraganglionic infarction (M4-M6 cortex): 3 Total score (0-10 with 10 being normal): 10 IMPRESSION: 1. Bifrontal atrophy with focal LEFT inferior frontal encephalomalacia, query old trauma. No acute intracranial findings. 2. ASPECTS is 10 A call was placed at the time of interpretation on 03/23/2016 at 6:28 pm to Dr. Grace Isaac. Electronically Signed   By: Elsie Stain M.D.   On: 03/23/2016 18:30    ASSESSMENT AND PLAN:    Principal Problem:   Chest pain Active Problems:   Occlusion and stenosis of carotid artery   Essential hypertension   Hyperlipidemia   Diplopia   DARINDA STUTEVILLE is a 76 y.o. female with a history of HTN, HLD, CKD, CAD [name]/p CABG  x4 V (2011) with left carotid to subclavian artery bypass grafting prior to bypass surgery, and R CEA (2013) who presented to [facility] on 03/23/16 with diplopia and chest pain.   Chest pain: likely 2/2 to elevated BP and anxiety. Self resolved. Troponin negative. No acute ST or TW changes in ECG. 2D ECHO pending for stroke work up. If normal LV function and wall motion, likely could be discharged home when cleared from a neurologic standpoint. However, if she needs carotid artery endarterectomy, she will need  pre-op nuc. Will defer to Dr. Elease Hashimoto   Carotid artery disease: MRA with high grade stenosis on the left carotid bifurcation affecting the distal common carotid artery and origin of the LEFT internal and external carotid artery. Stenosis is 75-90%, possibly greater. This could be related to visual disturbance. Followed by Dr. Myra Gianotti. Will need to consult vascular surgery to determine if surgical intervention required.   HTN: primary team allowing for permissive HTN given possibility of TIA  HLD: continue statin    Signed: Cline Crock, PA-C 03/24/2016 7:28 AM  Pager [phone]  Co-Sign MD  Attending Note:   The patient was seen and examined.  Agree with assessment and plan as noted above.  Changes made to the above note as needed.  Patient seen and independently examined with Carlean Jews, PA .   We discussed all aspects of the encounter. I agree with the assessment and plan as stated above.  1. Chest pain :   She had an episode of chest pain yesterday when she became anxious about her double vision. She knows that her heart rate and blood pressure were high. The chest pain was completely different than her previous episodes of angina. She does have known coronary artery disease and has had coronary artery bypass grafting in 2011.  She has a tight left carotid stenosis and it appears that she may need to have carotid endarterectomy. We'll go ahead and order a Lexiscan Myoview study for complete evaluation.   2. Carotid artery disease: She has tight left carotid stenosis. We will ask the vascular surgeons to see her. She wants to try to delay carotid surgery if possible but I told her I would leave that up to the vascular surgeon.  3. Sinus tachycardia: Her heart rate is fast at baseline.  We will increase her dose of metoprolol. She was taking Toprol-XL 12.5 mg daily at home.  I have spent a total of 40 minutes with patient reviewing hospital  notes , telemetry, EKGs, labs and  examining patient as well as establishing an assessment and plan that was discussed with the patient. > 50% of time was spent in direct patient care.     Vesta Mixer, Montez Hageman., MD, [facility] 03/24/2016, 8:49 AM [address]  [address] Office [phone] Pager [phone]

## 2016-03-24 NOTE — Consult Note (Signed)
Hospital Consult    Reason for Consult:  Carotid artery stenosis Referring Physician:  Maryelizabeth Kaufmann MRN #:  [id number]  History of Present Illness: This is a 76 y.o. female who presented to the ED yesterday with c/o double vision.  It is unclear if it was in both eyes as it only lasted for a few seconds.  It happened again immediately, but resolved again after a few seconds.  She states she was watching the news and the news ticker had 2 lines instead of one.  She states that she did have a sharp pain in her chest with the second episode, but feels this was due to anxiety.  Cardiology has been consulted and they are going to order a lexiscan as she may need a carotid intervention.  She had the Lexiscan this afternoon and results are pending.  She denies any numbness or weakness of any extremities or difficulty with speech.   She was last seen in our office in August 2017 and underwent carotid duplex, which revealed patent right ICA and 60-79% left ICA stenosis. The left CCA to SCA bypass was also patent.  She underwent MRI and was found to have a high grade stenosis on the left carotid bifurcation affecting the distal common carotid artery and origin of the left ICA and ECA.  Stenosis is 75-90% or possibly greater.  She has had a right carotid endarterectomy by Dr. Myra Gianotti on 05/06/11.  She was asymptomatic at that time.  She has hx of ascending aorto to  Innominate artery bypass graft, ascending aorta to left common carotid artery bypass graft and ascending aorta to left subclavian artery bypass graft on 12/30/09 also by Dr. Myra Gianotti.  She underwent  CABG x 4 on 12/30/09.  She has a hx of eye surgery for glaucoma in the past.   She is on a statin for cholesterol management.  She is on a CCB and beta blocker for blood pressure management.  She takes a daily aspirin.    Past Medical History:  Diagnosis Date  . Anemia   . Arthritis   . Blood transfusion    1965  . Carotid artery occlusion   .  Coronary artery disease Oct. 2011  . Eyesight diminished Change in eyesight  . GERD (gastroesophageal reflux disease)   . Headache(784.0)   . Hypercholesterolemia   . Hypertension   . Joint pain   . Shortness of breath on exertion     Past Surgical History:  Procedure Laterality Date  . ABDOMINAL HYSTERECTOMY    . BREAST BIOPSY     BILATERAL  BENIGN   . CARDIAC CATHETERIZATION     2011  DR Algie Coffer AT [facility]  . CARDIOVASCULAR STRESS TEST     2012 [facility] ALSO ECHO    . CAROTID ARTERY - SUBCLAVIAN ARTERY BYPASS GRAFT    . CESAREAN SECTION     X2   . CORONARY ARTERY BYPASS GRAFT     12/2009, x 4 plus 3 in left subclavian  . ENDARTERECTOMY  05/06/2011   Procedure: ENDARTERECTOMY CAROTID;  Surgeon: Nada Libman, MD;  Location: [facility] OR;  Service: Vascular;  Laterality: Right;  right carotid artery endarterctomy with vascu guard patch angioplasty   . EYE SURGERY     LASER BIL X2 FOR GLAUCOMA  . PR VEIN BYPASS GRAFT,AORTO-FEM-POP      Allergies  Allergen Reactions  . Statins Anaphylaxis    High dose ( Crestor 20 mg , pt cannot take)  . Penicillins  Hives and Rash    Has patient had a PCN reaction causing immediate rash, facial/tongue/throat swelling, SOB or lightheadedness with hypotension:YES Has patient had a PCN reaction causing severe rash involving mucus membranes or skin necrosis: NO Has patient had a PCN reaction that required hospitalization NO Has patient had a PCN reaction occurring within the last 10 years: NO If all of the above answers are "NO", then may proceed with Cephalosporin use.    Prior to Admission medications   Medication Sig Start Date End Date Taking? Authorizing Provider  ALPHA LIPOIC ACID PO Take 250 mg by mouth daily.   Yes Historical Provider, MD  amLODipine (NORVASC) 10 MG tablet TAKE 1 TABLET BY MOUTH DAILY. 03/11/16  Yes Runell Gess, MD  aspirin EC 81 MG tablet Take 81 mg by mouth daily.    Yes Historical Provider, MD  cholecalciferol (VITAMIN  D) 1000 UNITS tablet Take 1,000 Units by mouth daily.    Yes Historical Provider, MD  co-enzyme Q-10 50 MG capsule Take 100 mg by mouth daily.   Yes Historical Provider, MD  Flaxseed, Linseed, 1000 MG CAPS Take 1 capsule by mouth daily.    Yes Historical Provider, MD  Garlic (GARLIQUE PO) Take 600 mg by mouth daily.    Yes Historical Provider, MD  Magnesium 250 MG TABS Take 1 tablet by mouth daily.   Yes Historical Provider, MD  metoprolol succinate (TOPROL-XL) 25 MG 24 hr tablet TAKE 1/2 TABLET BY MOUTH ONCE A DAY 11/25/15  Yes Runell Gess, MD  Multiple Vitamin (MULITIVITAMIN WITH MINERALS) TABS Take 1 tablet by mouth daily.   Yes Historical Provider, MD  Omega-3 Fatty Acids (FISH OIL) 1000 MG CAPS Take 1,000 mg by mouth daily. Mega Kr[employer] Fish oil   Yes Historical Provider, MD  Polyethyl Glycol-Propyl Glycol (SYSTANE OP) Place 1 drop into both eyes daily as needed. For dryt eyes   Yes Historical Provider, MD  rosuvastatin (CRESTOR) 10 MG tablet Take 10 mg by mouth 3 (three) times a week. On Monday, Wednesday, and Friday    Yes Historical Provider, MD  vitamin B-12 (CYANOCOBALAMIN) 500 MCG tablet Take 500 mcg by mouth daily.   Yes Historical Provider, MD  Vitamin Mixture (ESTER-C) 500-60 MG TABS Take 500 mg by mouth daily.   Yes Historical Provider, MD    Social History   Social History  . Marital status: Married    Spouse name: N/A  . Number of children: N/A  . Years of education: N/A   Occupational History  . retired    Social History Main Topics  . Smoking status: Former Smoker    Years: 25.00    Quit date: 12/16/2009  . Smokeless tobacco: Never Used  . Alcohol use Yes     Comment: some  . Drug use: No  . Sexual activity: Not on file   Other Topics Concern  . Not on file   Social History Narrative  . No narrative on file     Family History  Problem Relation Age of Onset  . Diabetes Mother     Late onset  . Hyperlipidemia Mother   . Hypertension Mother   . Heart  attack Mother   . Diabetes Sister     Late onset  . Hyperlipidemia Sister   . Hypertension Sister     AAA  . Cancer Brother     Prostate  . Heart disease Brother   . Hypertension Brother   . Cancer Brother  Prostate     ROS: [x]  Positive   [ ]  Negative   [ ]  All sytems reviewed and are negative  Cardiovascular: [x]  chest pain/pressure []  palpitations []  SOB lying flat []  DOE []  pain in legs while walking []  pain in legs at rest []  pain in legs at night []  non-healing ulcers []  hx of DVT []  swelling in legs  HEENT: [x]  Glaucoma  Pulmonary: []  productive cough []  asthma/wheezing []  home O2  Neurologic: []  weakness in []  arms []  legs []  numbness in []  arms []  legs []  hx of CVA []  mini stroke [] difficulty speaking or slurred speech [x]  visual changes - see HPI []  dizziness  Hematologic: []  hx of cancer []  bleeding problems []  problems with blood clotting easily  Endocrine:   []  diabetes []  thyroid disease  GI []  vomiting blood []  blood in stool [x]  GERD  GU: []  CKD/renal failure []  HD--[]  M/W/F or []  T/T/S []  burning with urination []  blood in urine  Psychiatric: []  anxiety []  depression  Musculoskeletal: []  arthritis [x]  joint pain  Integumentary: []  rashes []  ulcers  Constitutional: []  fever []  chills   Physical Examination  Vitals:   03/24/16 1236 03/24/16 1238  BP: (!) 172/73 (!) 164/72  Pulse: (!) 122 (!) 116  Resp:    Temp:     Body mass index is 26.36 kg/m.  General:  WDWN in NAD Gait: Not observed HENT: WNL, normocephalic Pulmonary: normal non-labored breathing, without Rales, rhonchi,  wheezing Cardiac: regular, without  Murmurs, rubs or gallops; with carotid left bruit Abdomen:  soft, NT/ND, no masses Skin: without rashes Vascular Exam/Pulses:  Right Left  Radial 2+ (normal) 2+ (normal)  Ulnar Unable to palpate  Unable to palpate   DP 2+ (normal) 2+ (normal)  PT Unable to palpate  Unable to palpate     Extremities: without ischemic changes, without Gangrene , without cellulitis; without open wounds;  Musculoskeletal: no muscle wasting or atrophy  Neurologic: A&O X 3;  No focal weakness or paresthesias are detected; speech is fluent/normal Psychiatric:  The pt has Normal affect.   CBC    Component Value Date/Time   WBC 7.8 03/23/2016 1831   RBC 4.22 03/23/2016 1831   HGB 13.9 03/23/2016 1846   HCT 41.0 03/23/2016 1846   PLT 239 03/23/2016 1831   MCV 92.4 03/23/2016 1831   MCH 31.8 03/23/2016 1831   MCHC 34.4 03/23/2016 1831   RDW 13.4 03/23/2016 1831   LYMPHSABS 2.4 03/23/2016 1831   MONOABS 0.5 03/23/2016 1831   EOSABS 0.2 03/23/2016 1831   BASOSABS 0.0 03/23/2016 1831    BMET    Component Value Date/Time   NA 141 03/23/2016 1846   K 3.7 03/23/2016 1846   CL 105 03/23/2016 1846   CO2 22 03/23/2016 1831   GLUCOSE 118 (H) 03/23/2016 1846   BUN 15 03/23/2016 1846   CREATININE 1.00 03/23/2016 1846   CALCIUM 10.2 03/23/2016 1831   GFRNONAA 55 (L) 03/23/2016 1831   GFRAA >60 03/23/2016 1831    COAGS: Lab Results  Component Value Date   INR 0.96 03/23/2016   INR 0.99 04/27/2011   INR 1.64 (H) 12/30/2009     Non-Invasive Vascular Imaging:   MRI 03/23/16: IMPRESSION: No acute intracranial findings.  Atrophy and small vessel disease, suspected remote LEFT inferior frontal trauma, but no evidence for acute stroke.  No abnormal postcontrast enhancement.  Unremarkable MR angiography of the intracranial circulation.  High-grade stenosis of the LEFT carotid bifurcation affecting the  distal common carotid artery and origin of the LEFT internal and external carotid artery. Stenosis is 75-90%, possibly greater. Correlate clinically as a cause of blurred vision.  Statin:  Yes.   Beta Blocker:  Yes.   Aspirin:  Yes.   ACEI:  No. ARB:  No. CCB use:  Yes Other antiplatelets/anticoagulants:  Yes.   Plavix   ASSESSMENT/PLAN: This is a 76 y.o. female who has  had a right carotid endarterectomy by Dr. Myra Gianotti on 05/06/11.  She was asymptomatic at that time.  She has hx of ascending aorta to innominate artery bypass graft, ascending aorta to left common carotid artery bypass graft and ascending aorta to left subclavian artery bypass graft on 12/30/09 also by Dr. Myra Gianotti.  She presented with visual changes to the ER.  She has not had any further sx.     -MRI reveals that she has a left carotid stenosis of 75-90%.  She was last seen at [facility] in August 2017 and carotid duplex revealed 60-79% stenosis left. -Will get repeat carotid duplex -hx of ascending aorta to innominate artery bypass graft, ascending aorta to left common carotid artery bypass graft and ascending aorta to left subclavian artery bypass graft may make this patient a better candidate for carotid stenting -aspirin has been changed to Plavix daily -Lexiscan and 2D echo pending -Dr. Arbie Cookey will be by to see pt this evening. -pt is anxious to go home this evening.  She is scheduled to see our NP and have a carotid duplex on 04/13/16.  Okay to discharge from vascular standpoint.   Doreatha Massed, PA-C [facility] [phone]  Extensive past supra aortic trunk and carotid surgery with Dr. Myra Gianotti. Had 2 episodes of very briefly of double vision with no other focal deficit. Presented to the emergency room for further evaluation. MRI showed no new stroke. MRA suggested stenosis in her left carotid artery. Discussed this at length with the patient and her husband present. No symptoms related to this. Explained that MRA frequently overestimates the degree of narrowing. We'll see her in the office as scheduled with carotid duplex follow-up on 04/13/2016

## 2016-03-24 NOTE — Progress Notes (Signed)
   Rodolph BongWanda S Belloso presented for a lexiscan cardiolite today.  No immediate complications.  Stress imaging is pending at this time.  Nicolasa Duckinghristopher Mykal Kirchman, NP 03/24/2016, 12:40 PM

## 2016-03-24 NOTE — Progress Notes (Signed)
PROGRESS NOTE    Jennifer [name]  ZOX:[id number] DOB: 1940-12-28 DOA: 03/23/2016 PCP: Lonie Peak, PA-C  Brief Narrative:Jennifer [name] is a 76 y.o. female with medical history significant of HTN, HLD, CAD s/p CABG in 2011, carotid stenosis s/p R CEA presenting after she had acute onset double vision x 2 back to back.  It scared her, since she is familiar with stroke symptoms.  BP shot up to 200/100+, pulse 105.  Also with chest tightness, substernal, lasted 45 minutes.   Assessment & Plan:   Chest pain atypical -but with multiple risk factors and CABG in 2011 -Cards following, plan for Myoview today -troponin negative,  -continue ASA/metoprolol/statin  Diplopia with anisocoria -resolved -Neuro following -MRI- negative -MRA with high grade L carotid stenosis , prior R CEA, followed by Dr.Brabham -will request VVS eval -FU ECHO -LDL 103 on statin  HTN -BP improved but not optimal, on metoprolol  HLD -continue Crestor   DVT prophylaxis:  Lovenox  Code Status: DNR -  Family Communication: none at bedside Disposition Plan:  home pending cardiac and Neuro workup   Consultants:   Cards  Neuro   Subjective: Feels better now, double vision resolved, no chest pain or dyspnea  Objective: Vitals:   03/24/16 1213 03/24/16 1234 03/24/16 1236 03/24/16 1238  BP: (!) 185/81 (!) 179/68 (!) 172/73 (!) 164/72  Pulse: 96 (!) 116 (!) 122 (!) 116  Resp:      Temp:      TempSrc:      SpO2:      Weight:      Height:        Intake/Output Summary (Last 24 hours) at 03/24/16 1254 Last data filed at 03/24/16 0900  Gross per 24 hour  Intake                0 ml  Output                0 ml  Net                0 ml   Filed Weights   03/24/16 0635  Weight: 67.5 kg (148 lb 12.8 oz)    Examination:  General exam: Appears calm and comfortable, AAOx3 Respiratory system: Clear to auscultation. Respiratory effort normal. Cardiovascular system: S1 & S2 heard, RRR. No JVD, murmurs,  rubs, gallops or clicks. No pedal edema. Gastrointestinal system: Abdomen is nondistended, soft and nontender.  Normal bowel sounds heard. Central nervous system: Alert and oriented. No focal neurological deficits. Extremities: Symmetric 5 x 5 power. Skin: No rashes, lesions or ulcers Psychiatry: Judgement and insight appear normal. Mood & affect appropriate.     Data Reviewed: I have personally reviewed following labs and imaging studies  CBC:  Recent Labs Lab 03/23/16 1831 03/23/16 1846  WBC 7.8  --   NEUTROABS 4.8  --   HGB 13.4 13.9  HCT 39.0 41.0  MCV 92.4  --   PLT 239  --    Basic Metabolic Panel:  Recent Labs Lab 03/23/16 1831 03/23/16 1846  NA 138 141  K 3.8 3.7  CL 104 105  CO2 22  --   GLUCOSE 113* 118*  BUN 13 15  CREATININE 1.00 1.00  CALCIUM 10.2  --    GFR: Estimated Creatinine Clearance: 46.2 mL/min (by C-G formula based on SCr of 1 mg/dL). Liver Function Tests:  Recent Labs Lab 03/23/16 1831  AST 33  ALT 19  ALKPHOS 60  BILITOT 0.6  PROT 7.8  ALBUMIN 4.6   No results for input(s): LIPASE, AMYLASE in the last 168 hours. No results for input(s): AMMONIA in the last 168 hours. Coagulation Profile:  Recent Labs Lab 03/23/16 1831  INR 0.96   Cardiac Enzymes:  Recent Labs Lab 03/24/16 0321 03/24/16 0840  TROPONINI <0.03 <0.03   BNP (last 3 results) No results for input(s): PROBNP in the last 8760 hours. HbA1C: No results for input(s): HGBA1C in the last 72 hours. CBG:  Recent Labs Lab 03/23/16 1846  GLUCAP 116*   Lipid Profile:  Recent Labs  03/24/16 0528  CHOL 164  HDL 49  LDLCALC 103*  TRIG 62  CHOLHDL 3.3   Thyroid Function Tests:  Recent Labs  03/24/16 0315  TSH 1.966   Anemia Panel: No results for input(s): VITAMINB12, FOLATE, FERRITIN, TIBC, IRON, RETICCTPCT in the last 72 hours. Urine analysis:    Component Value Date/Time   COLORURINE YELLOW 04/27/2011 0955   APPEARANCEUR CLEAR 04/27/2011 0955     LABSPEC 1.009 04/27/2011 0955   PHURINE 7.0 04/27/2011 0955   GLUCOSEU NEGATIVE 04/27/2011 0955   HGBUR NEGATIVE 04/27/2011 0955   BILIRUBINUR NEGATIVE 04/27/2011 0955   KETONESUR NEGATIVE 04/27/2011 0955   PROTEINUR NEGATIVE 04/27/2011 0955   UROBILINOGEN 0.2 04/27/2011 0955   NITRITE NEGATIVE 04/27/2011 0955   LEUKOCYTESUR MODERATE (A) 04/27/2011 0955   Sepsis Labs: @LABRCNTIP (procalcitonin:4,lacticidven:4)  )No results found for this or any previous visit (from the past 240 hour(s)).       Radiology Studies: Mr Shirlee Latch ZO Contrast  Result Date: 03/23/2016 CLINICAL DATA:  Three episodes of blurred vision, now resolved. EXAM: MRI HEAD WITHOUT AND WITH CONTRAST AND MRA HEAD WITHOUT AND WITH CONTRAST AND MRI NECK WITHOUT AND WITH CONTRAST TECHNIQUE: Multiplanar, multiecho pulse sequences of the brain and surrounding structures were obtained without and with intravenous contrast. Angiographic images of the head were obtained using MRA technique without contrast. Multiplanar, multiecho pulse sequences of the neck and surrounding structures were obtained without and with intravenous contrast. CONTRAST:  15mL MULTIHANCE GADOBENATE DIMEGLUMINE 529 MG/ML IV SOLN COMPARISON:  CT head earlier today. FINDINGS: MRI HEAD FINDINGS Brain: No evidence for acute infarction, hemorrhage, mass lesion, hydrocephalus, or extra-axial fluid. Bifrontal atrophy. Focal inferior LEFT frontal encephalomalacia. Chronic blood products in this region along with gliosis suggest previous remote head trauma. Mild T2 and FLAIR hyperintensities in the periventricular and subcortical white matter suggests superimposed chronic microvascular ischemic change. Post infusion, no abnormal enhancement of the brain or meninges. Vascular: Normal flow voids. Skull and upper cervical spine: Normal marrow signal. Sinuses/Orbits: Negative. Other: None. MRA HEAD FINDINGS The internal carotid arteries are widely patent. The basilar artery is  widely patent. Both vertebrals contribute to its formation, RIGHT being slightly larger. Hypoplastic LEFT A1 anterior cerebral artery. No intracranial stenosis or aneurysm. MRA NECK FINDINGS Dolichoectatic great vessels, originated in normal branching pattern from the arch. Prior RIGHT carotid endarterectomy, with venous patch graft appears patulous but widely patent. At the LEFT carotid bifurcation, there is a high-grade stenosis of the LEFT internal carotid artery and distal common carotid artery extending into the LEFT external carotid artery. Estimated stenosis is 75-90%, possibly greater. RIGHT vertebral dominant and widely patent. LEFT vertebral ostial stenosis estimated 50-75%. Subclavian stenosis just distal to the vertebral origin estimated 50%. IMPRESSION: No acute intracranial findings. Atrophy and small vessel disease, suspected remote LEFT inferior frontal trauma, but no evidence for acute stroke. No abnormal postcontrast enhancement. Unremarkable MR angiography of the intracranial circulation.  High-grade stenosis of the LEFT carotid bifurcation affecting the distal common carotid artery and origin of the LEFT internal and external carotid artery. Stenosis is 75-90%, possibly greater. Correlate clinically as a cause of blurred vision. Electronically Signed   By: Elsie Stain M.D.   On: 03/23/2016 20:45   Mr Angiogram Neck W Or Wo Contrast  Result Date: 03/23/2016 CLINICAL DATA:  Three episodes of blurred vision, now resolved. EXAM: MRI HEAD WITHOUT AND WITH CONTRAST AND MRA HEAD WITHOUT AND WITH CONTRAST AND MRI NECK WITHOUT AND WITH CONTRAST TECHNIQUE: Multiplanar, multiecho pulse sequences of the brain and surrounding structures were obtained without and with intravenous contrast. Angiographic images of the head were obtained using MRA technique without contrast. Multiplanar, multiecho pulse sequences of the neck and surrounding structures were obtained without and with intravenous contrast.  CONTRAST:  15mL MULTIHANCE GADOBENATE DIMEGLUMINE 529 MG/ML IV SOLN COMPARISON:  CT head earlier today. FINDINGS: MRI HEAD FINDINGS Brain: No evidence for acute infarction, hemorrhage, mass lesion, hydrocephalus, or extra-axial fluid. Bifrontal atrophy. Focal inferior LEFT frontal encephalomalacia. Chronic blood products in this region along with gliosis suggest previous remote head trauma. Mild T2 and FLAIR hyperintensities in the periventricular and subcortical white matter suggests superimposed chronic microvascular ischemic change. Post infusion, no abnormal enhancement of the brain or meninges. Vascular: Normal flow voids. Skull and upper cervical spine: Normal marrow signal. Sinuses/Orbits: Negative. Other: None. MRA HEAD FINDINGS The internal carotid arteries are widely patent. The basilar artery is widely patent. Both vertebrals contribute to its formation, RIGHT being slightly larger. Hypoplastic LEFT A1 anterior cerebral artery. No intracranial stenosis or aneurysm. MRA NECK FINDINGS Dolichoectatic great vessels, originated in normal branching pattern from the arch. Prior RIGHT carotid endarterectomy, with venous patch graft appears patulous but widely patent. At the LEFT carotid bifurcation, there is a high-grade stenosis of the LEFT internal carotid artery and distal common carotid artery extending into the LEFT external carotid artery. Estimated stenosis is 75-90%, possibly greater. RIGHT vertebral dominant and widely patent. LEFT vertebral ostial stenosis estimated 50-75%. Subclavian stenosis just distal to the vertebral origin estimated 50%. IMPRESSION: No acute intracranial findings. Atrophy and small vessel disease, suspected remote LEFT inferior frontal trauma, but no evidence for acute stroke. No abnormal postcontrast enhancement. Unremarkable MR angiography of the intracranial circulation. High-grade stenosis of the LEFT carotid bifurcation affecting the distal common carotid artery and origin of  the LEFT internal and external carotid artery. Stenosis is 75-90%, possibly greater. Correlate clinically as a cause of blurred vision. Electronically Signed   By: Elsie Stain M.D.   On: 03/23/2016 20:45   Mr Laqueta Jean IO Contrast  Result Date: 03/23/2016 CLINICAL DATA:  Three episodes of blurred vision, now resolved. EXAM: MRI HEAD WITHOUT AND WITH CONTRAST AND MRA HEAD WITHOUT AND WITH CONTRAST AND MRI NECK WITHOUT AND WITH CONTRAST TECHNIQUE: Multiplanar, multiecho pulse sequences of the brain and surrounding structures were obtained without and with intravenous contrast. Angiographic images of the head were obtained using MRA technique without contrast. Multiplanar, multiecho pulse sequences of the neck and surrounding structures were obtained without and with intravenous contrast. CONTRAST:  15mL MULTIHANCE GADOBENATE DIMEGLUMINE 529 MG/ML IV SOLN COMPARISON:  CT head earlier today. FINDINGS: MRI HEAD FINDINGS Brain: No evidence for acute infarction, hemorrhage, mass lesion, hydrocephalus, or extra-axial fluid. Bifrontal atrophy. Focal inferior LEFT frontal encephalomalacia. Chronic blood products in this region along with gliosis suggest previous remote head trauma. Mild T2 and FLAIR hyperintensities in the periventricular and subcortical white matter suggests  superimposed chronic microvascular ischemic change. Post infusion, no abnormal enhancement of the brain or meninges. Vascular: Normal flow voids. Skull and upper cervical spine: Normal marrow signal. Sinuses/Orbits: Negative. Other: None. MRA HEAD FINDINGS The internal carotid arteries are widely patent. The basilar artery is widely patent. Both vertebrals contribute to its formation, RIGHT being slightly larger. Hypoplastic LEFT A1 anterior cerebral artery. No intracranial stenosis or aneurysm. MRA NECK FINDINGS Dolichoectatic great vessels, originated in normal branching pattern from the arch. Prior RIGHT carotid endarterectomy, with venous patch  graft appears patulous but widely patent. At the LEFT carotid bifurcation, there is a high-grade stenosis of the LEFT internal carotid artery and distal common carotid artery extending into the LEFT external carotid artery. Estimated stenosis is 75-90%, possibly greater. RIGHT vertebral dominant and widely patent. LEFT vertebral ostial stenosis estimated 50-75%. Subclavian stenosis just distal to the vertebral origin estimated 50%. IMPRESSION: No acute intracranial findings. Atrophy and small vessel disease, suspected remote LEFT inferior frontal trauma, but no evidence for acute stroke. No abnormal postcontrast enhancement. Unremarkable MR angiography of the intracranial circulation. High-grade stenosis of the LEFT carotid bifurcation affecting the distal common carotid artery and origin of the LEFT internal and external carotid artery. Stenosis is 75-90%, possibly greater. Correlate clinically as a cause of blurred vision. Electronically Signed   By: Elsie StainJohn T Curnes M.D.   On: 03/23/2016 20:45   Ct Head Code Stroke W/o Cm  Result Date: 03/23/2016 CLINICAL DATA:  Code stroke. Vertical diplopia, dilated RIGHT pupil. EXAM: CT HEAD WITHOUT CONTRAST TECHNIQUE: Contiguous axial images were obtained from the base of the skull through the vertex without intravenous contrast. COMPARISON:  02/28/2010 CTA head. FINDINGS: Brain: No evidence of acute infarction, hemorrhage, hydrocephalus, extra-axial collection or mass lesion/mass effect. Mild atrophy, particularly in the bifrontal regions. There is focal encephalomalacia in the inferior LEFT frontal lobe. This could relate to an old trauma. Hypoattenuation of white matter suggestive of chronic microvascular ischemic change. Vascular: No hyperdense vessel. Mild vascular calcification in the carotid siphon regions as well as the distal basilar. Skull: Normal. Negative for fracture or focal lesion. Sinuses/Orbits: No acute finding. Other: None.  Compared with prior study, no  abnormality is seen ASPECTS [facility]([place] Stroke Program Early CT Score) - Ganglionic level infarction (caudate, lentiform nuclei, internal capsule, insula, M1-M3 cortex): 7 - Supraganglionic infarction (M4-M6 cortex): 3 Total score (0-10 with 10 being normal): 10 IMPRESSION: 1. Bifrontal atrophy with focal LEFT inferior frontal encephalomalacia, query old trauma. No acute intracranial findings. 2. ASPECTS is 10 A call was placed at the time of interpretation on 03/23/2016 at 6:28 pm to Dr. Grace IsaacQEER. Electronically Signed   By: Elsie StainJohn T Curnes M.D.   On: 03/23/2016 18:30        Scheduled Meds: .  stroke: mapping our early stages of recovery book   Does not apply Once  . aspirin EC  81 mg Oral Daily  . enoxaparin (LOVENOX) injection  40 mg Subcutaneous Q24H  . metoprolol tartrate  25 mg Oral BID  . omega-3 acid ethyl esters  1 g Oral Daily  . regadenoson  0.4 mg Intravenous Once  . rosuvastatin  10 mg Oral Q M,W,F-1800   Continuous Infusions: . sodium chloride       LOS: 0 days    Time spent: 35min    Zannie CovePreetha Rochell Puett, MD [facility] Pager [phone]  If 7PM-7AM, please contact night-coverage [email or web address] Password [email or web address] 03/24/2016, 12:54 PM

## 2016-03-24 NOTE — Progress Notes (Signed)
STROKE TEAM PROGRESS NOTE   HISTORY OF PRESENT ILLNESS (per record) Jennifer [name] is an 76 y.o. female presented with acute onset of vertical diplopia. Its unclear if this was binocular because it only lasted for few seconds and resolved. She said it came back again immediately and lasted for few seconds then resolved. There is no facial droop, slurring of speech, weakness or numbness associated with diplopia. At this time her symptoms have completely resolved. She also reports that when this happened second time she also noted sharp mid sternal chest pain. She thinks this was due to anxiety attack. She has a history of right carotid artery endarterectomy due to significant blockage but never had a TIA or stroke. She was last known well 03/23/2016 at 16:00. MRankin: 0. Patient was not administered IV t-PA secondary to symptoms resolved. She was admitted for further evaluation and treatment.   SUBJECTIVE (INTERVAL HISTORY) She presented with 2 transient episodes of vertical diplopia lasting few seconds without any other complaints focal neurological symptoms. 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   OBJECTIVE Temp:  [98.6 F (37 C)] 98.6 F (37 C) (02/07 0635) Pulse Rate:  [74-122] 116 (02/07 1238) Cardiac Rhythm: Normal sinus rhythm (02/07 0900) Resp:  [15-22] 18 (02/07 0635) BP: (110-185)/(56-102) 164/72 (02/07 1238) SpO2:  [93 %-100 %] 98 % (02/07 0635) Weight:  [67.5 kg (148 lb 12.8 oz)] 67.5 kg (148 lb 12.8 oz) (02/07 0635)  CBC:  Recent Labs Lab 03/23/16 1831 03/23/16 1846  WBC 7.8  --   NEUTROABS 4.8  --   HGB 13.4 13.9  HCT 39.0 41.0  MCV 92.4  --   PLT 239  --     Basic Metabolic Panel:  Recent Labs Lab 03/23/16 1831 03/23/16 1846  NA 138 141  K 3.8 3.7  CL 104 105  CO2 22  --   GLUCOSE 113* 118*  BUN 13 15  CREATININE 1.00 1.00  CALCIUM 10.2  --     Lipid Panel:    Component Value Date/Time    CHOL 164 03/24/2016 0528   TRIG 62 03/24/2016 0528   HDL 49 03/24/2016 0528   CHOLHDL 3.3 03/24/2016 0528   VLDL 12 03/24/2016 0528   LDLCALC 103 (H) 03/24/2016 0528   HgbA1c:  Lab Results  Component Value Date   HGBA1C (H) 12/29/2009    6.0 (NOTE)                                                                       According to the ADA Clinical Practice Recommendations for 2011, when HbA1c is used as a screening test:   >=6.5%   Diagnostic of Diabetes Mellitus           (if abnormal result  is confirmed)  5.7-6.4%   Increased risk of developing Diabetes Mellitus  References:Diagnosis and Classification of Diabetes Mellitus,Diabetes Care,2011,34(Suppl 1):S62-S69 and Standards of Medical Care in         Diabetes - 2011,Diabetes Care,2011,34  (Suppl 1):S11-S61.   Urine Drug Screen:    Component Value Date/Time   LABOPIA NONE DETECTED 03/24/2016 0549   COCAINSCRNUR NONE DETECTED 03/24/2016 0549   LABBENZ NONE DETECTED 03/24/2016  0549   AMPHETMU NONE DETECTED 03/24/2016 0549   THCU NONE DETECTED 03/24/2016 0549   LABBARB NONE DETECTED 03/24/2016 0549      IMAGING  Mr Maxine Glenn Head Wo Contrast  Result Date: 03/23/2016 CLINICAL DATA:  Three episodes of blurred vision, now resolved. EXAM: MRI HEAD WITHOUT AND WITH CONTRAST AND MRA HEAD WITHOUT AND WITH CONTRAST AND MRI NECK WITHOUT AND WITH CONTRAST TECHNIQUE: Multiplanar, multiecho pulse sequences of the brain and surrounding structures were obtained without and with intravenous contrast. Angiographic images of the head were obtained using MRA technique without contrast. Multiplanar, multiecho pulse sequences of the neck and surrounding structures were obtained without and with intravenous contrast. CONTRAST:  15mL MULTIHANCE GADOBENATE DIMEGLUMINE 529 MG/ML IV SOLN COMPARISON:  CT head earlier today. FINDINGS: MRI HEAD FINDINGS Brain: No evidence for acute infarction, hemorrhage, mass lesion, hydrocephalus, or extra-axial fluid. Bifrontal  atrophy. Focal inferior LEFT frontal encephalomalacia. Chronic blood products in this region along with gliosis suggest previous remote head trauma. Mild T2 and FLAIR hyperintensities in the periventricular and subcortical white matter suggests superimposed chronic microvascular ischemic change. Post infusion, no abnormal enhancement of the brain or meninges. Vascular: Normal flow voids. Skull and upper cervical spine: Normal marrow signal. Sinuses/Orbits: Negative. Other: None. MRA HEAD FINDINGS The internal carotid arteries are widely patent. The basilar artery is widely patent. Both vertebrals contribute to its formation, RIGHT being slightly larger. Hypoplastic LEFT A1 anterior cerebral artery. No intracranial stenosis or aneurysm. MRA NECK FINDINGS Dolichoectatic great vessels, originated in normal branching pattern from the arch. Prior RIGHT carotid endarterectomy, with venous patch graft appears patulous but widely patent. At the LEFT carotid bifurcation, there is a high-grade stenosis of the LEFT internal carotid artery and distal common carotid artery extending into the LEFT external carotid artery. Estimated stenosis is 75-90%, possibly greater. RIGHT vertebral dominant and widely patent. LEFT vertebral ostial stenosis estimated 50-75%. Subclavian stenosis just distal to the vertebral origin estimated 50%. IMPRESSION: No acute intracranial findings. Atrophy and small vessel disease, suspected remote LEFT inferior frontal trauma, but no evidence for acute stroke. No abnormal postcontrast enhancement. Unremarkable MR angiography of the intracranial circulation. High-grade stenosis of the LEFT carotid bifurcation affecting the distal common carotid artery and origin of the LEFT internal and external carotid artery. Stenosis is 75-90%, possibly greater. Correlate clinically as a cause of blurred vision. Electronically Signed   By: Elsie Stain M.D.   On: 03/23/2016 20:45   Mr Angiogram Neck W Or Wo  Contrast  Result Date: 03/23/2016 CLINICAL DATA:  Three episodes of blurred vision, now resolved. EXAM: MRI HEAD WITHOUT AND WITH CONTRAST AND MRA HEAD WITHOUT AND WITH CONTRAST AND MRI NECK WITHOUT AND WITH CONTRAST TECHNIQUE: Multiplanar, multiecho pulse sequences of the brain and surrounding structures were obtained without and with intravenous contrast. Angiographic images of the head were obtained using MRA technique without contrast. Multiplanar, multiecho pulse sequences of the neck and surrounding structures were obtained without and with intravenous contrast. CONTRAST:  15mL MULTIHANCE GADOBENATE DIMEGLUMINE 529 MG/ML IV SOLN COMPARISON:  CT head earlier today. FINDINGS: MRI HEAD FINDINGS Brain: No evidence for acute infarction, hemorrhage, mass lesion, hydrocephalus, or extra-axial fluid. Bifrontal atrophy. Focal inferior LEFT frontal encephalomalacia. Chronic blood products in this region along with gliosis suggest previous remote head trauma. Mild T2 and FLAIR hyperintensities in the periventricular and subcortical white matter suggests superimposed chronic microvascular ischemic change. Post infusion, no abnormal enhancement of the brain or meninges. Vascular: Normal flow voids. Skull and upper cervical  spine: Normal marrow signal. Sinuses/Orbits: Negative. Other: None. MRA HEAD FINDINGS The internal carotid arteries are widely patent. The basilar artery is widely patent. Both vertebrals contribute to its formation, RIGHT being slightly larger. Hypoplastic LEFT A1 anterior cerebral artery. No intracranial stenosis or aneurysm. MRA NECK FINDINGS Dolichoectatic great vessels, originated in normal branching pattern from the arch. Prior RIGHT carotid endarterectomy, with venous patch graft appears patulous but widely patent. At the LEFT carotid bifurcation, there is a high-grade stenosis of the LEFT internal carotid artery and distal common carotid artery extending into the LEFT external carotid artery.  Estimated stenosis is 75-90%, possibly greater. RIGHT vertebral dominant and widely patent. LEFT vertebral ostial stenosis estimated 50-75%. Subclavian stenosis just distal to the vertebral origin estimated 50%. IMPRESSION: No acute intracranial findings. Atrophy and small vessel disease, suspected remote LEFT inferior frontal trauma, but no evidence for acute stroke. No abnormal postcontrast enhancement. Unremarkable MR angiography of the intracranial circulation. High-grade stenosis of the LEFT carotid bifurcation affecting the distal common carotid artery and origin of the LEFT internal and external carotid artery. Stenosis is 75-90%, possibly greater. Correlate clinically as a cause of blurred vision. Electronically Signed   By: Elsie StainJohn T Curnes M.D.   On: 03/23/2016 20:45   Mr Laqueta JeanBrain W ZOWo Contrast  Result Date: 03/23/2016 CLINICAL DATA:  Three episodes of blurred vision, now resolved. EXAM: MRI HEAD WITHOUT AND WITH CONTRAST AND MRA HEAD WITHOUT AND WITH CONTRAST AND MRI NECK WITHOUT AND WITH CONTRAST TECHNIQUE: Multiplanar, multiecho pulse sequences of the brain and surrounding structures were obtained without and with intravenous contrast. Angiographic images of the head were obtained using MRA technique without contrast. Multiplanar, multiecho pulse sequences of the neck and surrounding structures were obtained without and with intravenous contrast. CONTRAST:  15mL MULTIHANCE GADOBENATE DIMEGLUMINE 529 MG/ML IV SOLN COMPARISON:  CT head earlier today. FINDINGS: MRI HEAD FINDINGS Brain: No evidence for acute infarction, hemorrhage, mass lesion, hydrocephalus, or extra-axial fluid. Bifrontal atrophy. Focal inferior LEFT frontal encephalomalacia. Chronic blood products in this region along with gliosis suggest previous remote head trauma. Mild T2 and FLAIR hyperintensities in the periventricular and subcortical white matter suggests superimposed chronic microvascular ischemic change. Post infusion, no abnormal  enhancement of the brain or meninges. Vascular: Normal flow voids. Skull and upper cervical spine: Normal marrow signal. Sinuses/Orbits: Negative. Other: None. MRA HEAD FINDINGS The internal carotid arteries are widely patent. The basilar artery is widely patent. Both vertebrals contribute to its formation, RIGHT being slightly larger. Hypoplastic LEFT A1 anterior cerebral artery. No intracranial stenosis or aneurysm. MRA NECK FINDINGS Dolichoectatic great vessels, originated in normal branching pattern from the arch. Prior RIGHT carotid endarterectomy, with venous patch graft appears patulous but widely patent. At the LEFT carotid bifurcation, there is a high-grade stenosis of the LEFT internal carotid artery and distal common carotid artery extending into the LEFT external carotid artery. Estimated stenosis is 75-90%, possibly greater. RIGHT vertebral dominant and widely patent. LEFT vertebral ostial stenosis estimated 50-75%. Subclavian stenosis just distal to the vertebral origin estimated 50%. IMPRESSION: No acute intracranial findings. Atrophy and small vessel disease, suspected remote LEFT inferior frontal trauma, but no evidence for acute stroke. No abnormal postcontrast enhancement. Unremarkable MR angiography of the intracranial circulation. High-grade stenosis of the LEFT carotid bifurcation affecting the distal common carotid artery and origin of the LEFT internal and external carotid artery. Stenosis is 75-90%, possibly greater. Correlate clinically as a cause of blurred vision. Electronically Signed   By: Dale DurhamJohn T Curnes M.D.  On: 03/23/2016 20:45   Ct Head Code Stroke W/o Cm  Result Date: 03/23/2016 CLINICAL DATA:  Code stroke. Vertical diplopia, dilated RIGHT pupil. EXAM: CT HEAD WITHOUT CONTRAST TECHNIQUE: Contiguous axial images were obtained from the base of the skull through the vertex without intravenous contrast. COMPARISON:  02/28/2010 CTA head. FINDINGS: Brain: No evidence of acute  infarction, hemorrhage, hydrocephalus, extra-axial collection or mass lesion/mass effect. Mild atrophy, particularly in the bifrontal regions. There is focal encephalomalacia in the inferior LEFT frontal lobe. This could relate to an old trauma. Hypoattenuation of white matter suggestive of chronic microvascular ischemic change. Vascular: No hyperdense vessel. Mild vascular calcification in the carotid siphon regions as well as the distal basilar. Skull: Normal. Negative for fracture or focal lesion. Sinuses/Orbits: No acute finding. Other: None.  Compared with prior study, no abnormality is seen ASPECTS [facility] Stroke Program Early CT Score) - Ganglionic level infarction (caudate, lentiform nuclei, internal capsule, insula, M1-M3 cortex): 7 - Supraganglionic infarction (M4-M6 cortex): 3 Total score (0-10 with 10 being normal): 10 IMPRESSION: 1. Bifrontal atrophy with focal LEFT inferior frontal encephalomalacia, query old trauma. No acute intracranial findings. 2. ASPECTS is 10 A call was placed at the time of interpretation on 03/23/2016 at 6:28 pm to Dr. Grace Isaac. Electronically Signed   By: Elsie Stain M.D.   On: 03/23/2016 18:30     PHYSICAL EXAM Pleasant elderly Caucasian lady currently not in distress. . Afebrile. Head is nontraumatic. Neck is supple soft left carotid and subclavian bruit.    Cardiac exam no murmur or gallop. Lungs are clear to auscultation. Distal pulses are well felt. Large surgical scar from previous subclavian bypass surgery noted in the lower left neck and upper chest Neurological Exam ;  Awake  Alert oriented x 3. Normal speech and language.eye movements full without nystagmus.fundi were not visualized. Vision acuity and fields appear normal. Hearing is normal. Palatal movements are normal. Face symmetric. Tongue midline. Normal strength, tone, reflexes and coordination. Normal sensation. Gait deferred.  ASSESSMENT/PLAN Jennifer [name] is a 76 y.o. female with history of HTN,  HLD, CAD s/p CABG in 2011, carotid stenosis s/p R CEA presenting with vertical diplopia episodes 2. She did not receive IV t-PA due to resolved symptoms.   Posterior circulation TIA:  Diplopia with anisocoria  Resultant  neuro deficits resolved  Code stroke CT. Aspects = 10   MRI  no acute stroke. Small vessel disease. Suspected old left frontal trauma  MRA head  unremarkable  MRA neck high-grade stenosis left ICA bifurcation 75-90% moderate stenosis left vertebral origin  Carotid Doppler pending  2D Echo  pending  LDL 103  HgbA1c pending  Lovenox 40 mg sq daily for VTE prophylaxis  Diet Heart Room service appropriate? Yes; Fluid consistency: Thin  aspirin 81 mg daily prior to admission, now on plavix 75 mg daily  Patient counseled to be compliant with her antithrombotic medications  Ongoing aggressive stroke risk factor management  Therapy recommendations:  No therapy needs  Disposition:  pending   Carotid stenosis   History right CEA   MRA neck shows high-grade left ICA stenosis  Hypertension  Elevated but Stable  Avoid hypotension given carotid stenosis Long-term BP goal normotensive  Hyperlipidemia  Home meds:  Crestor 10, lovaza, resumed in hospital  LDL 103, goal < 70  Continue statin at discharge  Other Stroke Risk Factors  Advanced age  Coronary artery disease s/p CABG  Other Active Problems  Atypical chest pain felt to be secondary  to elevated blood pressure and anxiety. Self resolved. Troponin negative  Hospital day # 0 I have personally examined this patient, reviewed notes, independently viewed imaging studies, participated in medical decision making and plan of care.ROS completed by me personally and pertinent positives fully documented  I have made any additions or clarifications directly to the above note. She presented with 2 transient episodes of isolated vertical diplopia lasting few seconds unclear whether to call this a  posterior circulation TIA or not. Imaging studies demonstrate moderate stenosis of left vertebral origin and asymptomatic severe left carotid bifurcation stenosis. Recommend change aspirin to Plavix for stroke prevention and follow-up carotid Dopplers. She may need elective left carotid revascularization. If she has recurrent posterior circulation symptoms may consider left vertebral origin angioplasty in the future. Greater than 50% time during this 35 minute visit was spent on counseling and coordination of care about stroke and TIA risk, prevention and treatment discussion  Delia Heady, MD Medical Director [facility] Pager: [phone] 03/24/2016 2:14 PM   To contact Stroke Continuity provider, please refer to [email or web address]. After hours, contact General Neurology

## 2016-03-24 NOTE — Care Management Obs Status (Signed)
MEDICARE OBSERVATION STATUS NOTIFICATION   Patient Details  Name: Jennifer BongWanda S Lunden MRN: [id number] Date of Birth: 11/25/1940   Medicare Observation Status Notification Given:  Yes    Gala LewandowskyGraves-Bigelow, Taneil Lazarus Kaye, RN 03/24/2016, 2:40 PM

## 2016-03-24 NOTE — Progress Notes (Signed)
PT Cancellation Note  Patient Details Name: Rodolph BongWanda S Stofer MRN: [id number] DOB: 04/16/1940   Cancelled Treatment:    Reason Eval/Treat Not Completed: Patient not medically ready Pt on bedrest. Will await increase in activity orders prior to PT evaluation.   Blake DivineShauna A [place] 03/24/2016, 10:01 AM Mylo RedShauna Jaquari Reckner, PT, DPT [phone]

## 2016-03-24 NOTE — Progress Notes (Signed)
Nutrition Consult -- Brief Note  Received MD consult for TIA evaluation. Patient reports some weight loss, 5% within the past 5 months is not significant for the time frame. She is happy with the weight loss. She has a good appetite now and PTA. Nutrition focused physical exam completed.  No muscle or subcutaneous fat depletion noticed.  Wt Readings from Last 5 Encounters:  03/24/16 148 lb 12.8 oz (67.5 kg)  11/11/15 155 lb (70.3 kg)  09/25/15 155 lb (70.3 kg)  05/13/15 159 lb 12.8 oz (72.5 kg)  03/27/15 159 lb (72.1 kg)    Body mass index is 26.36 kg/m. Patient meets criteria for overweight based on current BMI.   Diet just advanced to heart healthy. Labs and medications reviewed.   Patient declines need for diet education as she "used to work in the health care field" and knows how to eat healthy.  No nutrition interventions warranted at this time. If nutrition issues arise, please consult RD.   Joaquin CourtsKimberly Harris, RD, LDN, CNSC Pager [phone] After Hours Pager [phone]

## 2016-03-24 NOTE — Progress Notes (Signed)
SLP Cancellation Note  Patient Details Name: Jennifer [name] MRN: [id number] DOB: 08/02/1940   Cancelled treatment:       Reason Eval/Treat Not Completed: Other (comment).  Symptoms resolved; no speech/communication deficits - no SLP eval warranted.    Blenda MountsCouture, Bonnetta Allbee Laurice 03/24/2016, 1:18 PM

## 2016-03-25 LAB — HEMOGLOBIN A1C
Hgb A1c MFr Bld: 5.6 % (ref 4.8–5.6)
Mean Plasma Glucose: 114 mg/dL

## 2016-03-26 NOTE — Discharge Summary (Signed)
Physician Discharge Summary  Rodolph BongWanda S [name] ZOX:[id number] DOB: 04/12/1940 DOA: 03/23/2016  PCP: Jennifer PeakNathan Conroy, PA-C  Admit date: 03/23/2016 Discharge date: 03/24/2016  Time spent: 45 minutes  Recommendations for Outpatient Follow-up:  1. PCP in 1 week 2. FU with [facility] Dr.Brabham in 2weeks for carotid duplex   Discharge Diagnoses:  Principal Problem:   Chest pain   Diplopia   Occlusion and stenosis of carotid artery   Essential hypertension   Hyperlipidemia   Diplopia   Discharge Condition: stable  Diet recommendation: heart healthy  Filed Weights   03/24/16 0635  Weight: 67.5 kg (148 lb 12.8 oz)    History of present illness:  Charlann LangeWanda S [name] a 76 y.o.femalewith medical history significant of HTN, HLD, CAD s/p CABG in 2011, carotid stenosis s/p R CEA presenting after she hadacute onset double vision x 2 back to back. It scared her, since she is familiar with stroke symptoms. BP shot up to 200/100+, pulse 105. Also with chest tightness, substernal, lasted 45 minutes.   Hospital Course:   Chest pain atypical -but with multiple risk factors and CABG in 2011 -Cards consulted s/p Myoview no inducible ischemia -troponin negative,  -continue ASA/metoprolol/statin -FU with Cardiology  Diplopia with anisocoria -resolved -Neuro consulted, recommended changing ASA to Plavix -MRI- negative -MRA with high grade L carotid stenosis , prior R CEA, followed by Dr.Brabham -s/p [facility] eval, recommended outpatient FU with Carotid duplex -LDL 103 on statin  HTN -BP improved but not optimal, on metoprolol  HLD -continue Crestor   Procedures:  Myoview  Consultations:  Neurology  [facility]  Cardiology  Discharge Exam: Vitals:   03/24/16 1353 03/24/16 1746  BP: (!) 147/84 (!) 148/90  Pulse: 88 90  Resp: 16   Temp: 97.6 F (36.4 C)     General: AAOx3 Cardiovascular: S1S2/RRR Respiratory: CTAB  Discharge Instructions   Discharge Instructions    Diet - low sodium  heart healthy    Complete by:  As directed    Increase activity slowly    Complete by:  As directed      Discharge Medication List as of 03/24/2016  7:33 PM    START taking these medications   Details  clopidogrel (PLAVIX) 75 MG tablet Take 1 tablet (75 mg total) by mouth daily., Starting Wed 03/24/2016, Print      CONTINUE these medications which have NOT CHANGED   Details  ALPHA LIPOIC ACID PO Take 250 mg by mouth daily., Historical Med    amLODipine (NORVASC) 10 MG tablet TAKE 1 TABLET BY MOUTH DAILY., Normal    cholecalciferol (VITAMIN D) 1000 UNITS tablet Take 1,000 Units by mouth daily. , Historical Med    co-enzyme Q-10 50 MG capsule Take 100 mg by mouth daily., Until Discontinued, Historical Med    Flaxseed, Linseed, 1000 MG CAPS Take 1 capsule by mouth daily. , Until Discontinued, Historical Med    Garlic (GARLIQUE PO) Take 600 mg by mouth daily. , Historical Med    Magnesium 250 MG TABS Take 1 tablet by mouth daily., Until Discontinued, Historical Med    metoprolol succinate (TOPROL-XL) 25 MG 24 hr tablet TAKE 1/2 TABLET BY MOUTH ONCE A DAY, Normal    Multiple Vitamin (MULITIVITAMIN WITH MINERALS) TABS Take 1 tablet by mouth daily., Until Discontinued, Historical Med    Omega-3 Fatty Acids (FISH OIL) 1000 MG CAPS Take 1,000 mg by mouth daily. Mega Krill Fish oil, Until Discontinued, Historical Med    Polyethyl Glycol-Propyl Glycol (SYSTANE OP) Place 1  drop into both eyes daily as needed. For dryt eyes, Historical Med    rosuvastatin (CRESTOR) 10 MG tablet Take 10 mg by mouth 3 (three) times a week. On Monday, Wednesday, and Friday , Until Discontinued, Historical Med    vitamin B-12 (CYANOCOBALAMIN) 500 MCG tablet Take 500 mcg by mouth daily., Until Discontinued, Historical Med    Vitamin Mixture (ESTER-C) 500-60 MG TABS Take 500 mg by mouth daily., Historical Med      STOP taking these medications     aspirin EC 81 MG tablet        Allergies  Allergen  Reactions  . Statins Anaphylaxis    High dose ( Crestor 20 mg , pt cannot take)  . Penicillins Hives and Rash    Has patient had a PCN reaction causing immediate rash, facial/tongue/throat swelling, SOB or lightheadedness with hypotension:YES Has patient had a PCN reaction causing severe rash involving mucus membranes or skin necrosis: NO Has patient had a PCN reaction that required hospitalization NO Has patient had a PCN reaction occurring within the last 10 years: NO If all of the above answers are "NO", then may proceed with Cephalosporin use.   Follow-up Information    Jennifer Peak, PA-C. Schedule an appointment as soon as possible for a visit in 1 week(s).   Specialty:  Physician Assistant Contact information: [address] [place] [address] [phone]            The results of significant diagnostics from this hospitalization (including imaging, microbiology, ancillary and laboratory) are listed below for reference.    Significant Diagnostic Studies: Mr Shirlee Latch BJ Contrast  Result Date: 03/23/2016 CLINICAL DATA:  Three episodes of blurred vision, now resolved. EXAM: MRI HEAD WITHOUT AND WITH CONTRAST AND MRA HEAD WITHOUT AND WITH CONTRAST AND MRI NECK WITHOUT AND WITH CONTRAST TECHNIQUE: Multiplanar, multiecho pulse sequences of the brain and surrounding structures were obtained without and with intravenous contrast. Angiographic images of the head were obtained using MRA technique without contrast. Multiplanar, multiecho pulse sequences of the neck and surrounding structures were obtained without and with intravenous contrast. CONTRAST:  15mL MULTIHANCE GADOBENATE DIMEGLUMINE 529 MG/ML IV SOLN COMPARISON:  CT head earlier today. FINDINGS: MRI HEAD FINDINGS Brain: No evidence for acute infarction, hemorrhage, mass lesion, hydrocephalus, or extra-axial fluid. Bifrontal atrophy. Focal inferior LEFT frontal encephalomalacia. Chronic blood products in this region along with  gliosis suggest previous remote head trauma. Mild T2 and FLAIR hyperintensities in the periventricular and subcortical white matter suggests superimposed chronic microvascular ischemic change. Post infusion, no abnormal enhancement of the brain or meninges. Vascular: Normal flow voids. Skull and upper cervical spine: Normal marrow signal. Sinuses/Orbits: Negative. Other: None. MRA HEAD FINDINGS The internal carotid arteries are widely patent. The basilar artery is widely patent. Both vertebrals contribute to its formation, RIGHT being slightly larger. Hypoplastic LEFT A1 anterior cerebral artery. No intracranial stenosis or aneurysm. MRA NECK FINDINGS Dolichoectatic great vessels, originated in normal branching pattern from the arch. Prior RIGHT carotid endarterectomy, with venous patch graft appears patulous but widely patent. At the LEFT carotid bifurcation, there is a high-grade stenosis of the LEFT internal carotid artery and distal common carotid artery extending into the LEFT external carotid artery. Estimated stenosis is 75-90%, possibly greater. RIGHT vertebral dominant and widely patent. LEFT vertebral ostial stenosis estimated 50-75%. Subclavian stenosis just distal to the vertebral origin estimated 50%. IMPRESSION: No acute intracranial findings. Atrophy and small vessel disease, suspected remote LEFT inferior frontal trauma, but no  evidence for acute stroke. No abnormal postcontrast enhancement. Unremarkable MR angiography of the intracranial circulation. High-grade stenosis of the LEFT carotid bifurcation affecting the distal common carotid artery and origin of the LEFT internal and external carotid artery. Stenosis is 75-90%, possibly greater. Correlate clinically as a cause of blurred vision. Electronically Signed   By: Elsie Stain M.D.   On: 03/23/2016 20:45   Mr Angiogram Neck W Or Wo Contrast  Result Date: 03/23/2016 CLINICAL DATA:  Three episodes of blurred vision, now resolved. EXAM: MRI  HEAD WITHOUT AND WITH CONTRAST AND MRA HEAD WITHOUT AND WITH CONTRAST AND MRI NECK WITHOUT AND WITH CONTRAST TECHNIQUE: Multiplanar, multiecho pulse sequences of the brain and surrounding structures were obtained without and with intravenous contrast. Angiographic images of the head were obtained using MRA technique without contrast. Multiplanar, multiecho pulse sequences of the neck and surrounding structures were obtained without and with intravenous contrast. CONTRAST:  15mL MULTIHANCE GADOBENATE DIMEGLUMINE 529 MG/ML IV SOLN COMPARISON:  CT head earlier today. FINDINGS: MRI HEAD FINDINGS Brain: No evidence for acute infarction, hemorrhage, mass lesion, hydrocephalus, or extra-axial fluid. Bifrontal atrophy. Focal inferior LEFT frontal encephalomalacia. Chronic blood products in this region along with gliosis suggest previous remote head trauma. Mild T2 and FLAIR hyperintensities in the periventricular and subcortical white matter suggests superimposed chronic microvascular ischemic change. Post infusion, no abnormal enhancement of the brain or meninges. Vascular: Normal flow voids. Skull and upper cervical spine: Normal marrow signal. Sinuses/Orbits: Negative. Other: None. MRA HEAD FINDINGS The internal carotid arteries are widely patent. The basilar artery is widely patent. Both vertebrals contribute to its formation, RIGHT being slightly larger. Hypoplastic LEFT A1 anterior cerebral artery. No intracranial stenosis or aneurysm. MRA NECK FINDINGS Dolichoectatic great vessels, originated in normal branching pattern from the arch. Prior RIGHT carotid endarterectomy, with venous patch graft appears patulous but widely patent. At the LEFT carotid bifurcation, there is a high-grade stenosis of the LEFT internal carotid artery and distal common carotid artery extending into the LEFT external carotid artery. Estimated stenosis is 75-90%, possibly greater. RIGHT vertebral dominant and widely patent. LEFT vertebral  ostial stenosis estimated 50-75%. Subclavian stenosis just distal to the vertebral origin estimated 50%. IMPRESSION: No acute intracranial findings. Atrophy and small vessel disease, suspected remote LEFT inferior frontal trauma, but no evidence for acute stroke. No abnormal postcontrast enhancement. Unremarkable MR angiography of the intracranial circulation. High-grade stenosis of the LEFT carotid bifurcation affecting the distal common carotid artery and origin of the LEFT internal and external carotid artery. Stenosis is 75-90%, possibly greater. Correlate clinically as a cause of blurred vision. Electronically Signed   By: Elsie Stain M.D.   On: 03/23/2016 20:45   Mr Laqueta Jean ZO Contrast  Result Date: 03/23/2016 CLINICAL DATA:  Three episodes of blurred vision, now resolved. EXAM: MRI HEAD WITHOUT AND WITH CONTRAST AND MRA HEAD WITHOUT AND WITH CONTRAST AND MRI NECK WITHOUT AND WITH CONTRAST TECHNIQUE: Multiplanar, multiecho pulse sequences of the brain and surrounding structures were obtained without and with intravenous contrast. Angiographic images of the head were obtained using MRA technique without contrast. Multiplanar, multiecho pulse sequences of the neck and surrounding structures were obtained without and with intravenous contrast. CONTRAST:  15mL MULTIHANCE GADOBENATE DIMEGLUMINE 529 MG/ML IV SOLN COMPARISON:  CT head earlier today. FINDINGS: MRI HEAD FINDINGS Brain: No evidence for acute infarction, hemorrhage, mass lesion, hydrocephalus, or extra-axial fluid. Bifrontal atrophy. Focal inferior LEFT frontal encephalomalacia. Chronic blood products in this region along with gliosis suggest previous remote  head trauma. Mild T2 and FLAIR hyperintensities in the periventricular and subcortical white matter suggests superimposed chronic microvascular ischemic change. Post infusion, no abnormal enhancement of the brain or meninges. Vascular: Normal flow voids. Skull and upper cervical spine: Normal  marrow signal. Sinuses/Orbits: Negative. Other: None. MRA HEAD FINDINGS The internal carotid arteries are widely patent. The basilar artery is widely patent. Both vertebrals contribute to its formation, RIGHT being slightly larger. Hypoplastic LEFT A1 anterior cerebral artery. No intracranial stenosis or aneurysm. MRA NECK FINDINGS Dolichoectatic great vessels, originated in normal branching pattern from the arch. Prior RIGHT carotid endarterectomy, with venous patch graft appears patulous but widely patent. At the LEFT carotid bifurcation, there is a high-grade stenosis of the LEFT internal carotid artery and distal common carotid artery extending into the LEFT external carotid artery. Estimated stenosis is 75-90%, possibly greater. RIGHT vertebral dominant and widely patent. LEFT vertebral ostial stenosis estimated 50-75%. Subclavian stenosis just distal to the vertebral origin estimated 50%. IMPRESSION: No acute intracranial findings. Atrophy and small vessel disease, suspected remote LEFT inferior frontal trauma, but no evidence for acute stroke. No abnormal postcontrast enhancement. Unremarkable MR angiography of the intracranial circulation. High-grade stenosis of the LEFT carotid bifurcation affecting the distal common carotid artery and origin of the LEFT internal and external carotid artery. Stenosis is 75-90%, possibly greater. Correlate clinically as a cause of blurred vision. Electronically Signed   By: Elsie Stain M.D.   On: 03/23/2016 20:45   Nm Myocar Multi W/spect W/wall Motion / Ef  Result Date: 03/24/2016 CLINICAL DATA:  Chest pain. Hypertension. Coronary artery disease. Gastroesophageal reflux. Hypertension. CABG. EXAM: MYOCARDIAL IMAGING WITH SPECT (REST AND PHARMACOLOGIC-STRESS) GATED LEFT VENTRICULAR WALL MOTION STUDY LEFT VENTRICULAR EJECTION FRACTION TECHNIQUE: Standard myocardial SPECT imaging was performed after resting intravenous injection of 10 mCi Tc-73m tetrofosmin. Subsequently,  intravenous infusion of Lexiscan was performed under the supervision of the Cardiology staff. At [name] effect of the drug, 30 mCi Tc-58m tetrofosmin was injected intravenously and standard myocardial SPECT imaging was performed. Quantitative gated imaging was also performed to evaluate left ventricular wall motion, and estimate left ventricular ejection fraction. COMPARISON:  Two-view chest x-ray 04/27/2011. 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 There is no reversible ischemia. Wall Motion: Marked hypokinesia is present along the septum and inferior wall. Normal wall motion is present otherwise. Left Ventricular Ejection Fraction: 70 % End diastolic volume 28 ml End systolic volume 8 ml IMPRESSION: 1. 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 There is no reversible ischemia. 2. Marked hypokinesia along the septum and inferior wall compatible with the remote infarct. Wall motion is otherwise normal. 3. Left ventricular ejection fraction 70% 4. Non invasive risk stratification*: Low *2012 Appropriate Use Criteria for Coronary Revascularization Focused Update: J Am Coll Cardiol. 2012;59(9):857-881. [email or web address] Electronically Signed   By: Marin Roberts M.D.   On: 03/24/2016 15:03   Ct Head Code Stroke W/o Cm  Result Date: 03/23/2016 CLINICAL DATA:  Code stroke. Vertical diplopia, dilated RIGHT pupil. EXAM: CT HEAD WITHOUT CONTRAST TECHNIQUE: Contiguous axial images were obtained from the base of the skull through the vertex without intravenous contrast. COMPARISON:  02/28/2010 CTA head. FINDINGS: Brain: No evidence of acute infarction, hemorrhage, hydrocephalus, extra-axial collection or mass lesion/mass effect. Mild atrophy, particularly in the bifrontal regions. There is focal encephalomalacia in the inferior LEFT frontal lobe. This could relate to an old trauma. Hypoattenuation of white matter suggestive of chronic microvascular  ischemic change. Vascular: No hyperdense vessel. Mild vascular calcification in  the carotid siphon regions as well as the distal basilar. Skull: Normal. Negative for fracture or focal lesion. Sinuses/Orbits: No acute finding. Other: None.  Compared with prior study, no abnormality is seen ASPECTS [facility] Stroke Program Early CT Score) - Ganglionic level infarction (caudate, lentiform nuclei, internal capsule, insula, M1-M3 cortex): 7 - Supraganglionic infarction (M4-M6 cortex): 3 Total score (0-10 with 10 being normal): 10 IMPRESSION: 1. Bifrontal atrophy with focal LEFT inferior frontal encephalomalacia, query old trauma. No acute intracranial findings. 2. ASPECTS is 10 A call was placed at the time of interpretation on 03/23/2016 at 6:28 pm to Dr. Grace Isaac. Electronically Signed   By: Elsie Stain M.D.   On: 03/23/2016 18:30    Microbiology: No results found for this or any previous visit (from the past 240 hour(s)).   Labs: Basic Metabolic Panel:  Recent Labs Lab 03/23/16 1831 03/23/16 1846  NA 138 141  K 3.8 3.7  CL 104 105  CO2 22  --   GLUCOSE 113* 118*  BUN 13 15  CREATININE 1.00 1.00  CALCIUM 10.2  --    Liver Function Tests:  Recent Labs Lab 03/23/16 1831  AST 33  ALT 19  ALKPHOS 60  BILITOT 0.6  PROT 7.8  ALBUMIN 4.6   No results for input(s): LIPASE, AMYLASE in the last 168 hours. No results for input(s): AMMONIA in the last 168 hours. CBC:  Recent Labs Lab 03/23/16 1831 03/23/16 1846  WBC 7.8  --   NEUTROABS 4.8  --   HGB 13.4 13.9  HCT 39.0 41.0  MCV 92.4  --   PLT 239  --    Cardiac Enzymes:  Recent Labs Lab 03/24/16 0321 03/24/16 0840 03/24/16 1433  TROPONINI <0.03 <0.03 <0.03   BNP: BNP (last 3 results) No results for input(s): BNP in the last 8760 hours.  ProBNP (last 3 results) No results for input(s): PROBNP in the last 8760 hours.  CBG:  Recent Labs Lab 03/23/16 1846  GLUCAP 116*       SignedZannie Cove MD.  [facility] 03/26/2016, 2:25 PM

## 2016-03-31 DIAGNOSIS — I6523 Occlusion and stenosis of bilateral carotid arteries: Secondary | ICD-10-CM | POA: Diagnosis not present

## 2016-03-31 DIAGNOSIS — H539 Unspecified visual disturbance: Secondary | ICD-10-CM | POA: Diagnosis not present

## 2016-03-31 DIAGNOSIS — I10 Essential (primary) hypertension: Secondary | ICD-10-CM | POA: Diagnosis not present

## 2016-03-31 DIAGNOSIS — Z139 Encounter for screening, unspecified: Secondary | ICD-10-CM | POA: Diagnosis not present

## 2016-03-31 DIAGNOSIS — Z79899 Other long term (current) drug therapy: Secondary | ICD-10-CM | POA: Diagnosis not present

## 2016-04-02 ENCOUNTER — Encounter (HOSPITAL_COMMUNITY): Payer: Medicare Other

## 2016-04-02 ENCOUNTER — Ambulatory Visit: Payer: Medicare Other | Admitting: Family

## 2016-04-07 ENCOUNTER — Encounter: Payer: Self-pay | Admitting: Family

## 2016-04-13 ENCOUNTER — Encounter: Payer: Self-pay | Admitting: Family

## 2016-04-13 ENCOUNTER — Ambulatory Visit (HOSPITAL_COMMUNITY)
Admission: RE | Admit: 2016-04-13 | Discharge: 2016-04-13 | Disposition: A | Discharge status: Home / Self Care | Payer: Medicare Other | Source: Ambulatory Visit | Attending: Family | Admitting: Family

## 2016-04-13 ENCOUNTER — Ambulatory Visit (INDEPENDENT_AMBULATORY_CARE_PROVIDER_SITE_OTHER): Payer: Medicare Other | Admitting: Family

## 2016-04-13 VITALS — BP 171/99 | HR 87 | Temp 97.3°F | Resp 98 | Ht 63.0 in | Wt 152.9 lb

## 2016-04-13 DIAGNOSIS — I6523 Occlusion and stenosis of bilateral carotid arteries: Secondary | ICD-10-CM

## 2016-04-13 DIAGNOSIS — Z9889 Other specified postprocedural states: Secondary | ICD-10-CM | POA: Diagnosis not present

## 2016-04-13 DIAGNOSIS — Z95828 Presence of other vascular implants and grafts: Secondary | ICD-10-CM | POA: Insufficient documentation

## 2016-04-13 DIAGNOSIS — Z87891 Personal history of nicotine dependence: Secondary | ICD-10-CM | POA: Diagnosis not present

## 2016-04-13 LAB — VAS US CAROTID
LCCAPDIAS: 20 cm/s
LEFT ECA DIAS: -46 cm/s
LICADDIAS: -28 cm/s
LICAPDIAS: -81 cm/s
LICAPSYS: -305 cm/s
Left CCA dist dias: 23 cm/s
Left CCA dist sys: 72 cm/s
Left CCA prox sys: 105 cm/s
Left ICA dist sys: -106 cm/s
RIGHT CCA MID DIAS: -34 cm/s
RIGHT ECA DIAS: -16 cm/s
Right CCA prox dias: 23 cm/s
Right CCA prox sys: 109 cm/s
Right cca dist sys: -90 cm/s

## 2016-04-13 MED ORDER — CLOPIDOGREL BISULFATE 75 MG PO TABS: 75.0000 mg | ORAL_TABLET | Freq: Every day | ORAL | 6 refills | Status: DC

## 2016-04-13 NOTE — Patient Instructions (Signed)
Preventing Cerebrovascular Disease Arteries are blood vessels that carry blood that contains oxygen from the heart to all parts of the body. Cerebrovascular disease affects arteries that supply the brain. Any condition that blocks or disrupts blood flow to the brain can cause cerebrovascular disease. Brain cells that lose blood supply start to die within minutes (stroke). Stroke is the main danger of cerebrovascular disease. Atherosclerosis and high blood pressure are common causes of cerebrovascular disease. Atherosclerosis is narrowing and hardening of an artery that results when fat, cholesterol, calcium, or other substances (plaque) build up inside an artery. Plaque reduces blood flow through the artery. High blood pressure increases the risk of bleeding inside the brain. Making diet and lifestyle changes to prevent atherosclerosis and high blood pressure lowers your risk of cerebrovascular disease. What nutrition changes can be made?  Eat more fruits, vegetables, and whole grains.  Reduce how much saturated fat you eat. To do this, eat less red meat and fewer full-fat dairy products.  Eat healthy proteins instead of red meat. Healthy proteins include:  Fish. Eat fish that contains heart-healthy omega-3 fatty acids, twice a week. Examples include salmon, albacore tuna, mackerel, and herring.  Chicken.  Nuts.  Low-fat or nonfat yogurt.  Avoid processed meats, like bacon and lunchmeat.  Avoid foods that contain:  A lot of sugar, such as sweets and drinks with added sugar.  A lot of salt (sodium). Avoid adding extra salt to your food, as told by your health care provider.  Trans fats, such as margarine and baked goods. Trans fats may be listed as "partially hydrogenated oils" on food labels.  Check food labels to see how much sodium, sugar, and trans fats are in foods.  Use vegetable oils that contain low amounts of saturated fat, such as olive oil or canola oil. What lifestyle  changes can be made?  Drink alcohol in moderation. This means no more than 1 drink a day for nonpregnant women and 2 drinks a day for men. One drink equals 12 oz of beer, 5 oz of wine, or 1 oz of hard liquor.  If you are overweight, ask your health care provider to recommend a weight-loss plan for you. Losing 5-10 lb (2.2-4.5 kg) can reduce your risk of diabetes, atherosclerosis, and high blood pressure.  Exercise for 30?60 minutes on most days, or as much as told by your health care provider.  Do moderate-intensity exercise, such as brisk walking, bicycling, and water aerobics. Ask your health care provider which activities are safe for you.  Do not use any products that contain nicotine or tobacco, such as cigarettes and e-cigarettes. If you need help quitting, ask your health care provider. Why are these changes important? Making these changes lowers your risk of many diseases that can cause cerebrovascular disease and stroke. Stroke is a leading cause of death and disability. Making these changes also improves your overall health and quality of life. What can I do to lower my risk? The following factors make you more likely to develop cerebrovascular disease:  Being overweight.  Smoking.  Being physically inactive.  Eating a high-fat diet.  Having certain health conditions, such as:  Diabetes.  High blood pressure.  Heart disease.  Atherosclerosis.  High cholesterol.  Sickle cell disease. Talk with your health care provider about your risk for cerebrovascular disease. Work with your health care provider to control diseases that you have that may contribute to cerebrovascular disease. Your health care provider may prescribe medicines to help prevent major   causes of cerebrovascular disease. Where to find more information: Learn more about preventing cerebrovascular disease from:  National Heart, Lung, and Blood Institute:  www.nhlbi.nih.gov/health/health-topics/topics/stroke  Centers for Disease Control and Prevention: cdc.gov/stroke/about.htm Summary  Cerebrovascular disease can lead to a stroke.  Atherosclerosis and high blood pressure are major causes of cerebrovascular disease.  Making diet and lifestyle changes can reduce your risk of cerebrovascular disease.  Work with your health care provider to get your risk factors under control to reduce your risk of cerebrovascular disease. This information is not intended to replace advice given to you by your health care provider. Make sure you discuss any questions you have with your health care provider. Document Released: 02/16/2015 Document Revised: 08/22/2015 Document Reviewed: 02/16/2015 Elsevier Interactive Patient Education  2017 [employer]  

## 2016-04-13 NOTE — Progress Notes (Signed)
Chief Complaint: Follow up Extracranial Carotid Artery Stenosis   History of Present Illness  Jennifer [name] is a 76 y.o. female patient of Dr. Myra GianottiBrabham who is status post ascending aorta innominate left CCA and left subclavian artery bypass graft in November 2011, and a right carotid endarterectomy in [name] 2013. She returns today for follow up. She denies tingling, numbness, weakness, or pain in either hand/arm. Patient reports headaches and right arm weakness with 20 mg Crestor qd which was stopped, mild myalgias on 10 mg Crestor 3x/week currently.  She states her blood pressure usually runs 134/76 or less, that her blood pressure goes up in Dr. offices.  Is walking twice/week, 2 miles.   Had 4 vessel CABG 2011, denies MI.  She denies any history of TIA or stroke symptoms. Specifically she denies a history of hemiparesis, unilateral facial drooping, amaurosis fugax, or speech difficulties.  She denies claudication symptoms in her legs with walking.  On 03-23-16 she had an episode pf diplopia, severe anxiety, and chest tightness and severe hypertension, [facility] Dr. Arbie CookeyEarly evaluated pt MRI of head and neck during this evaluation. During this episode she denies any hemiparesis, denies speech difficulties.  She was evaluated by Dr. Joya MartyrPreetha, neurologist.   Her cardiologist is Corine ShelterLuke Kilroy, PA-C and Dr. Allyson SabalBerry.  Her PCP is Lonie PeakNathan Conroy, PA-C.   Pt Diabetic: A1C was 5.6 on 03-24-16 (review of results) Pt smoker: former smoker, quit in 2011 Pt meds include:  Statin : Yes, cannot tolerate high dose statins, had muscle spasms, is taking 10 mg Crestor 3x/week Betablocker: Yes  ASA: stopped when Plavix started February 2018 Other anticoagulants/antiplatelets: Plavix started February 2018.    Past Medical History:  Diagnosis Date  . Anemia   . Arthritis   . Blood transfusion    1965  . Carotid artery occlusion   . Coronary artery disease Oct. 2011  . Eyesight diminished  Change in eyesight  . GERD (gastroesophageal reflux disease)   . Headache(784.0)   . Hypercholesterolemia   . Hypertension   . Joint pain   . Shortness of breath on exertion     Social History Social History  Substance Use Topics  . Smoking status: Former Smoker    Years: 25.00    Quit date: 12/16/2009  . Smokeless tobacco: Never Used  . Alcohol use Yes     Comment: some    Family History Family History  Problem Relation Age of Onset  . Diabetes Mother     Late onset  . Hyperlipidemia Mother   . Hypertension Mother   . Heart attack Mother   . Diabetes Sister     Late onset  . Hyperlipidemia Sister   . Hypertension Sister     AAA  . Cancer Brother     Prostate  . Heart disease Brother   . Hypertension Brother   . Cancer Brother     Prostate     Surgical History Past Surgical History:  Procedure Laterality Date  . ABDOMINAL HYSTERECTOMY    . BREAST BIOPSY     BILATERAL  BENIGN   . CARDIAC CATHETERIZATION     2011  DR Algie [facility]KADAKIA [facility]  . CARDIOVASCULAR STRESS TEST     [facility]    . CAROTID ARTERY - SUBCLAVIAN ARTERY BYPASS GRAFT    . CESAREAN SECTION     X2   . CORONARY ARTERY BYPASS GRAFT     12/2009, x 4 plus 3 in  left subclavian  . ENDARTERECTOMY  05/06/2011   Procedure: ENDARTERECTOMY CAROTID;  Surgeon: Nada Libman, MD;  Location: [facility] OR;  Service: Vascular;  Laterality: Right;  right carotid artery endarterctomy with vascu guard patch angioplasty   . EYE SURGERY     LASER BIL X2 FOR GLAUCOMA  . PR VEIN BYPASS GRAFT,AORTO-FEM-POP      Allergies  Allergen Reactions  . Statins Anaphylaxis    High dose ( Crestor 20 mg , pt cannot take)  . Penicillins Hives and Rash    Has patient had a PCN reaction causing immediate rash, facial/tongue/throat swelling, SOB or lightheadedness with hypotension:YES Has patient had a PCN reaction causing severe rash involving mucus membranes or skin necrosis: NO Has patient had a PCN reaction that  required hospitalization NO Has patient had a PCN reaction occurring within the last 10 years: NO If all of the above answers are "NO", then may proceed with Cephalosporin use.    Current Outpatient Prescriptions  Medication Sig Dispense Refill  . ALPHA LIPOIC ACID PO Take 250 mg by mouth daily.    Marland Kitchen amLODipine (NORVASC) 10 MG tablet TAKE 1 TABLET BY MOUTH DAILY. 90 tablet 2  . cholecalciferol (VITAMIN D) 1000 UNITS tablet Take 1,000 Units by mouth daily.     . clopidogrel (PLAVIX) 75 MG tablet Take 1 tablet (75 mg total) by mouth daily. 30 tablet 0  . co-enzyme Q-10 50 MG capsule Take 100 mg by mouth daily.    . Flaxseed, Linseed, 1000 MG CAPS Take 1 capsule by mouth daily.     . Garlic (GARLIQUE PO) Take 600 mg by mouth daily.     . Magnesium 250 MG TABS Take 1 tablet by mouth daily.    . metoprolol succinate (TOPROL-XL) 25 MG 24 hr tablet TAKE 1/2 TABLET BY MOUTH ONCE A DAY 30 tablet 5  . Multiple Vitamin (MULITIVITAMIN WITH MINERALS) TABS Take 1 tablet by mouth daily.    . Omega-3 Fatty Acids (FISH OIL) 1000 MG CAPS Take 1,000 mg by mouth daily. Mega Krill Fish oil    . Polyethyl Glycol-Propyl Glycol (SYSTANE OP) Place 1 drop into both eyes daily as needed. For dryt eyes    . rosuvastatin (CRESTOR) 10 MG tablet Take 10 mg by mouth 3 (three) times a week. On Monday, Wednesday, and Friday     . vitamin B-12 (CYANOCOBALAMIN) 500 MCG tablet Take 500 mcg by mouth daily.    . Vitamin Mixture (ESTER-C) 500-60 MG TABS Take 500 mg by mouth daily.     No current facility-administered medications for this visit.     Review of Systems : See HPI for pertinent positives and negatives.  Physical Examination  Vitals:   04/13/16 1146 04/13/16 1148  BP: (!) 173/88 (!) 171/99  Pulse: 87   Resp: (!) 98   Temp: 97.3 F (36.3 C)   TempSrc: Oral   SpO2: 98%   Weight: 152 lb 14.4 oz (69.4 kg)   Height: 5\' 3"  (1.6 m)    Body mass index is 27.09 kg/m.  General: WDWN female in NAD   GAIT:normal  Eyes: PERRLA  Pulmonary: Respirations are non labored, CTAB, good air movement.  Cardiac: regular rhythm, no murmur detected  VASCULAR EXAM Carotid Bruits Left Right   Negative  Negative    Radial pulses are 2+ and equal   LE Pulses  LEFT  RIGHT   POPLITEAL  not palpable  not palpable  POSTERIOR TIBIAL  not palpable  not  palpable   DORSALIS PEDIS ANTERIOR TIBIAL  palpable  Not palpable    Gastrointestinal: soft, nontender, BS WNL, no r/g, no palpable masses.  Musculoskeletal: No muscle atrophy/wasting. M/S 5/5 throughout, Extremities without ischemic changes.  Neurologic: A&O X 3; Appropriate Affect,  Speech is normal  CN 2-12 intact except slight left side facial droop with smile, Pain and light touch intact in extremities, Motor exam as listed above.      Assessment: KETARA CAVNESS is a 76 y.o. female who is s/p aorta to innominate, left common carotid, and left subclavian arterial bypass on 12/30/2009; right carotid endarterectomy 05/06/2011. She has no history of stroke or TIA.   Her atherosclerotic risk factors include CAD, hypertension, former smoker, dyslipidemia, and pre-diabetes.   I discussed with Dr. Arbie Cookey pt HPI, exam results, and carotid duplex results. He advised that the Plavix be renewed, do not resume the ASA, and see Dr. Myra Gianotti on her return in 6 months with carotid duplex.  I advised pt to check her blood pressure at home today, if higher than 150/95, to notify her PCP or cardiologist.   DATA Today's carotid Duplex suggests right internal carotid artery status post endarterectomy with no evidence for restenosis. 60 - 79% left internal carotid artery stenosis. Patent left common carotid artery to subclavian artery bypass graft Elevated velocities of 357 cm/s with visible plaque in the right proximal subclavian artery; no retrograde flow noted in the right vertebral artery.   Bilateral vertebral artery flow is antegrade.  Bilateral subclavian artery waveforms are normal.  Elevated velocities in the right proximal subclavian artery compared to previous exam. No other significant changes noted.    Plan: Follow-up in 6 months with Carotid Duplex scan, see Dr. Myra Gianotti afterward.    I discussed in depth with the patient the nature of atherosclerosis, and emphasized the importance of maximal medical management including strict control of blood pressure, blood glucose, and lipid levels, obtaining regular exercise, and continued cessation of smoking.  The patient is aware that without maximal medical management the underlying atherosclerotic disease process will progress, limiting the benefit of any interventions. The patient was given information about stroke prevention and what symptoms should prompt the patient to seek immediate medical care. Thank you for allowing [place] to participate in this patient's care.  Jennifer March, RN, MSN, FNP-C [facility] [place] Office: [phone]  Clinic Physician: Early  04/13/16 11:50 AM

## 2016-04-14 ENCOUNTER — Telehealth: Payer: Self-pay | Admitting: *Deleted

## 2016-04-14 NOTE — Telephone Encounter (Signed)
Pt has been checking BP every day at home. States she gets white coat hypertension in MD office.  She states readings at home are running in 130s-140s/70s range. She does not have concerns and stated she appreciated the offer but declined appt at this time.  She voiced she will call back to see us if she has concerns for elevations at home & if she wants to be seen sooner than sched visit w Dr. Allyson SabalBerry in March.

## 2016-04-14 NOTE — Telephone Encounter (Signed)
-----   Message from Abelino DerrickLuke K Kilroy, [place] sent at 04/14/2016  3:46 PM EST ----- Can you contact this pt and see if she'll come in for a B/P check and OV with me in the next week or two.  Corine ShelterLUKE KILROY PA-C 04/14/2016 3:47 PM

## 2016-05-11 ENCOUNTER — Ambulatory Visit (INDEPENDENT_AMBULATORY_CARE_PROVIDER_SITE_OTHER): Payer: Medicare Other | Admitting: Cardiovascular Disease

## 2016-05-11 ENCOUNTER — Encounter: Payer: Self-pay | Admitting: Cardiovascular Disease

## 2016-05-11 DIAGNOSIS — I10 Essential (primary) hypertension: Secondary | ICD-10-CM

## 2016-05-11 DIAGNOSIS — E7800 Pure hypercholesterolemia, unspecified: Secondary | ICD-10-CM

## 2016-05-11 DIAGNOSIS — I739 Peripheral vascular disease, unspecified: Secondary | ICD-10-CM | POA: Diagnosis not present

## 2016-05-11 DIAGNOSIS — I2510 Atherosclerotic heart disease of native coronary artery without angina pectoris: Secondary | ICD-10-CM | POA: Diagnosis not present

## 2016-05-11 NOTE — Patient Instructions (Signed)
Medication Instructions: Your physician recommends that you continue on your current medications as directed. Please refer to the Current Medication list given to you today.   Labwork: I will request lab work from Apache Corporationathan Conroy, [address].  Follow-Up: Your physician wants you to follow-up in: 1 year with Dr. Allyson SabalBerry. You will receive a reminder letter in the mail two months in advance. If you don't receive a letter, please call our office to schedule the follow-up appointment.  If you need a refill on your cardiac medications before your next appointment, please call your pharmacy.

## 2016-05-11 NOTE — Assessment & Plan Note (Signed)
History of coronary artery bypass grafting X 4 by Dr. Laneta SimmersBartle 12/28/09. She had a recent Myoview performed 03/24/16 which was negative during her recent hospitalization. She denies chest pain or shortness of breath.

## 2016-05-11 NOTE — Assessment & Plan Note (Signed)
History of essential hypertension with blood pressure measured today at 150/89 although her blood pressure measurements which she reviewed with me today as well were much better than this in the normal range. She is on amlodipine, metoprolol. Continue current meds at current dosing

## 2016-05-11 NOTE — Assessment & Plan Note (Signed)
History of carotid artery disease status post left common carotid to subclavian bypass November 2011 and right carotid endarterectomy in 2013 followed by Dr. Myra GianottiBrabham.

## 2016-05-11 NOTE — Assessment & Plan Note (Signed)
History of hyperlipidemia on statin therapy followed by her PCP. 

## 2016-05-11 NOTE — Progress Notes (Signed)
05/11/2016 Jennifer [name]   07-22-1940  [id number]  Primary Physician Lonie Peak, PA-C Primary Cardiologist: Runell Gess MD Nicholes Calamity, [place]  HPI:  Jennifer [name] is a 76 year old mildly overweight married Caucasian female mother of 2 children, grandmother and one grandchild was formally a patient of Dr. Algie Coffer.I last saw her in the office 05/13/15. 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 Risk factors include discontinue tobacco use having quit in November of 2011 and smoked 25 pack years. She has treated hypertension and dyslipidemia. She has a history of left carotid to subclavian artery bypass grafting by Dr. Myra Gianotti as post coronary artery bypass grafting x411/13/11 by Dr. Rexanne Mano. She's also had a lack of right carotid endarterectomy performed by Dr. Myra Gianotti 3/13 He follows her as an outpatient noninvasively. She denies chest pain or shortness of breath, or claudication. She was recently hospitalized overnight 03/24/16 for "diplopia and was evaluated by Dr. Arbie Cookey at that time. She did have a Myoview performed 03/24/16 which was entirely normal.  Current Outpatient Prescriptions  Medication Sig Dispense Refill  . ALPHA LIPOIC ACID PO Take 250 mg by mouth daily.    Marland Kitchen amLODipine (NORVASC) 10 MG tablet TAKE 1 TABLET BY MOUTH DAILY. 90 tablet 2  . cholecalciferol (VITAMIN D) 1000 UNITS tablet Take 1,000 Units by mouth daily.     . clopidogrel (PLAVIX) 75 MG tablet Take 1 tablet (75 mg total) by mouth daily. 30 tablet 6  . co-enzyme Q-10 50 MG capsule Take 100 mg by mouth daily.    . Flaxseed, Linseed, 1000 MG CAPS Take 1 capsule by mouth daily.     . Garlic (GARLIQUE PO) Take 600 mg by mouth daily.     . Magnesium 250 MG TABS Take 1 tablet by mouth daily.    . metoprolol succinate (TOPROL-XL) 25 MG 24 hr tablet TAKE 1/2 TABLET BY MOUTH ONCE A DAY 30 tablet 5  . Multiple Vitamin (MULITIVITAMIN WITH MINERALS) TABS Take 1 tablet by mouth daily.    . Omega-3 Fatty Acids (FISH  OIL) 1000 MG CAPS Take 1,000 mg by mouth daily. Mega Krill Fish oil    . Polyethyl Glycol-Propyl Glycol (SYSTANE OP) Place 1 drop into both eyes daily as needed. For dryt eyes    . rosuvastatin (CRESTOR) 10 MG tablet Take 10 mg by mouth 3 (three) times a week. On Monday, Wednesday, and Friday     . vitamin B-12 (CYANOCOBALAMIN) 500 MCG tablet Take 500 mcg by mouth daily.    . Vitamin Mixture (ESTER-C) 500-60 MG TABS Take 500 mg by mouth daily.     No current facility-administered medications for this visit.     Allergies  Allergen Reactions  . Statins Anaphylaxis    High dose ( Crestor 20 mg , pt cannot take)  . Penicillins Hives and Rash    Has patient had a PCN reaction causing immediate rash, facial/tongue/throat swelling, SOB or lightheadedness with hypotension:YES Has patient had a PCN reaction causing severe rash involving mucus membranes or skin necrosis: NO Has patient had a PCN reaction that required hospitalization NO Has patient had a PCN reaction occurring within the last 10 years: NO If all of the above answers are "NO", then may proceed with Cephalosporin use.    Social History   Social History  . Marital status: Married    Spouse name: N/A  . Number of children: N/A  . Years of education: N/A   Occupational History  .  retired    Social History Main Topics  . Smoking status: Former Smoker    Years: 25.00    Quit date: 12/16/2009  . Smokeless tobacco: Never Used  . Alcohol use Yes     Comment: some  . Drug use: No  . Sexual activity: Not on file   Other Topics Concern  . Not on file   Social History Narrative  . No narrative on file     Review of Systems: General: negative for chills, fever, night sweats or weight changes.  Cardiovascular: negative for chest pain, dyspnea on exertion, edema, orthopnea, palpitations, paroxysmal nocturnal dyspnea or shortness of breath Dermatological: negative for rash Respiratory: negative for cough or  wheezing Urologic: negative for hematuria Abdominal: negative for nausea, vomiting, diarrhea, bright red blood per rectum, melena, or hematemesis Neurologic: negative for visual changes, syncope, or dizziness All other systems reviewed and are otherwise negative except as noted above.    Blood pressure (!) 160/89, pulse 99, height 5\' 3"  (1.6 m), weight 154 lb 9.6 oz (70.1 kg), SpO2 96 %.  General appearance: alert and no distress Neck: no adenopathy, no JVD, supple, symmetrical, trachea midline, thyroid not enlarged, symmetric, no tenderness/mass/nodules and Bilateral carotid bruits Lungs: clear to auscultation bilaterally Heart: regular rate and rhythm, S1, S2 normal, no murmur, click, rub or gallop Extremities: extremities normal, atraumatic, no cyanosis or edema  EKG not performed today  ASSESSMENT AND PLAN:   CABG x 4 2011 History of coronary artery bypass grafting X 4 by Dr. Laneta SimmersBartle 12/28/09. She had a recent Myoview performed 03/24/16 which was negative during her recent hospitalization. She denies chest pain or shortness of breath.  Essential hypertension History of essential hypertension with blood pressure measured today at 150/89 although her blood pressure measurements which she reviewed with me today as well were much better than this in the normal range. She is on amlodipine, metoprolol. Continue current meds at current dosing  Hyperlipidemia History of hyperlipidemia on statin therapy followed by her PCP  PVD- Lt CCA/Lt SCA BPG Nov 2011 and RCEA 2013 with residual moderate LICA disase History of carotid artery disease status post left common carotid to subclavian bypass November 2011 and right carotid endarterectomy in 2013 followed by Dr. Myra GianottiBrabham.      Runell GessJonathan J. Andrei Mccook MD [facility] HospitalFACP,FACC,FAHA, [facility] 05/11/2016 11:11 AM

## 2016-08-26 DIAGNOSIS — N390 Urinary tract infection, site not specified: Secondary | ICD-10-CM | POA: Diagnosis not present

## 2016-08-31 DIAGNOSIS — E7800 Pure hypercholesterolemia, unspecified: Secondary | ICD-10-CM | POA: Diagnosis not present

## 2016-08-31 DIAGNOSIS — I2510 Atherosclerotic heart disease of native coronary artery without angina pectoris: Secondary | ICD-10-CM | POA: Diagnosis not present

## 2016-08-31 DIAGNOSIS — I10 Essential (primary) hypertension: Secondary | ICD-10-CM | POA: Diagnosis not present

## 2016-08-31 DIAGNOSIS — N183 Chronic kidney disease, stage 3 (moderate): Secondary | ICD-10-CM | POA: Diagnosis not present

## 2016-08-31 DIAGNOSIS — Z1389 Encounter for screening for other disorder: Secondary | ICD-10-CM | POA: Diagnosis not present

## 2016-08-31 DIAGNOSIS — I6523 Occlusion and stenosis of bilateral carotid arteries: Secondary | ICD-10-CM | POA: Diagnosis not present

## 2016-09-29 ENCOUNTER — Encounter: Payer: Self-pay | Admitting: Surgery

## 2016-10-11 ENCOUNTER — Ambulatory Visit (INDEPENDENT_AMBULATORY_CARE_PROVIDER_SITE_OTHER): Payer: Medicare Other | Admitting: Surgery

## 2016-10-11 ENCOUNTER — Ambulatory Visit (HOSPITAL_COMMUNITY)
Admission: RE | Admit: 2016-10-11 | Discharge: 2016-10-11 | Disposition: A | Discharge status: Home / Self Care | Payer: Medicare Other | Source: Ambulatory Visit | Attending: Family | Admitting: Family

## 2016-10-11 ENCOUNTER — Encounter: Payer: Self-pay | Admitting: Surgery

## 2016-10-11 VITALS — BP 143/79 | HR 74 | Ht 63.0 in | Wt 151.0 lb

## 2016-10-11 DIAGNOSIS — Z9889 Other specified postprocedural states: Secondary | ICD-10-CM | POA: Diagnosis not present

## 2016-10-11 DIAGNOSIS — I6523 Occlusion and stenosis of bilateral carotid arteries: Secondary | ICD-10-CM

## 2016-10-11 LAB — VAS US CAROTID
LCCADDIAS: -22 cm/s
LCCADSYS: -69 cm/s
LEFT ECA DIAS: 39 cm/s
LICADDIAS: -23 cm/s
LICADSYS: -75 cm/s
LICAPDIAS: -53 cm/s
LICAPSYS: -250 cm/s
Left CCA prox dias: 21 cm/s
Left CCA prox sys: 76 cm/s
RIGHT CCA MID DIAS: -31 cm/s
RIGHT ECA DIAS: -19 cm/s
Right CCA prox dias: 19 cm/s
Right CCA prox sys: 96 cm/s
Right cca dist sys: -70 cm/s

## 2016-10-11 NOTE — Progress Notes (Signed)
[facility] [place]  Patient name: Jennifer [name] MRN: [id number] DOB: 1940-07-03 Sex: female   REASON FOR VISIT:    Follow up  HISOTRY OF PRESENT ILLNESS:    Jennifer [name] is a 76 y.o. female returns today for follow-up.  In November 2011 she underwent aortic arch reconstruction with bypass graft to the innominate, left carotid, and left subclavian artery.  This was done simultaneously with CABG.  She has also undergone a right carotid endarterectomy in March 2013 for asymptomatic stenosis.  Earlier in February 2018 she had an episode of diplopia severe anxiety, chest tightness and severe hypertension.  She was seen and evaluated by Dr. early.  She did have an MRI that suggested worsening carotid stenosis.  She was treated nonoperatively.  She has not had any other episodes since that event.  She continues to struggle with hypercholesterolemia.  She is a diabetic which has been well controlled.  She is a former smoker.   PAST MEDICAL HISTORY:   Past Medical History:  Diagnosis Date  . Anemia   . Arthritis   . Blood transfusion    1965  . Carotid artery occlusion   . Coronary artery disease Oct. 2011  . Eyesight diminished Change in eyesight  . GERD (gastroesophageal reflux disease)   . Headache(784.0)   . Hypercholesterolemia   . Hypertension   . Joint pain   . Shortness of breath on exertion      FAMILY HISTORY:   Family History  Problem Relation Age of Onset  . Diabetes Mother        Late onset  . Hyperlipidemia Mother   . Hypertension Mother   . Heart attack Mother   . Diabetes Sister        Late onset  . Hyperlipidemia Sister   . Hypertension Sister        AAA  . Cancer Brother        Prostate  . Heart disease Brother   . Hypertension Brother   . Cancer Brother        Prostate     SOCIAL HISTORY:   Social History  Substance Use Topics  . Smoking status: Former Smoker    Years: 25.00    Quit  date: 12/16/2009  . Smokeless tobacco: Never Used  . Alcohol use Yes     Comment: some     ALLERGIES:   Allergies  Allergen Reactions  . Statins Anaphylaxis    High dose ( Crestor 20 mg , pt cannot take)  . Penicillins Hives and Rash    Has patient had a PCN reaction causing immediate rash, facial/tongue/throat swelling, SOB or lightheadedness with hypotension:YES Has patient had a PCN reaction causing severe rash involving mucus membranes or skin necrosis: NO Has patient had a PCN reaction that required hospitalization NO Has patient had a PCN reaction occurring within the last 10 years: NO If all of the above answers are "NO", then may proceed with Cephalosporin use.     CURRENT MEDICATIONS:   Current Outpatient Prescriptions  Medication Sig Dispense Refill  . ALPHA LIPOIC ACID PO Take 250 mg by mouth daily.    Marland Kitchen amLODipine (NORVASC) 10 MG tablet TAKE 1 TABLET BY MOUTH DAILY. 90 tablet 2  . cholecalciferol (VITAMIN D) 1000 UNITS tablet Take 1,000 Units by mouth daily.     . clopidogrel (PLAVIX) 75 MG tablet Take 1 tablet (75 mg total) by mouth daily. 30 tablet 6  . co-enzyme  Q-10 50 MG capsule Take 100 mg by mouth daily.    . Flaxseed, Linseed, 1000 MG CAPS Take 1 capsule by mouth daily.     . Garlic (GARLIQUE PO) Take 600 mg by mouth daily.     . Magnesium 250 MG TABS Take 1 tablet by mouth daily.    . metoprolol succinate (TOPROL-XL) 25 MG 24 hr tablet TAKE 1/2 TABLET BY MOUTH ONCE A DAY 30 tablet 5  . Multiple Vitamin (MULITIVITAMIN WITH MINERALS) TABS Take 1 tablet by mouth daily.    . Omega-3 Fatty Acids (FISH OIL) 1000 MG CAPS Take 1,000 mg by mouth daily. Mega Krill Fish oil    . Polyethyl Glycol-Propyl Glycol (SYSTANE OP) Place 1 drop into both eyes daily as needed. For dryt eyes    . rosuvastatin (CRESTOR) 10 MG tablet Take 10 mg by mouth 3 (three) times a week. On Monday, Wednesday, and Friday     . vitamin B-12 (CYANOCOBALAMIN) 500 MCG tablet Take 500 mcg by mouth  daily.    . Vitamin Mixture (ESTER-C) 500-60 MG TABS Take 500 mg by mouth daily.     No current facility-administered medications for this visit.     REVIEW OF SYSTEMS:   [X]  denotes positive finding, [ ]  denotes negative finding Cardiac  Comments:  Chest pain or chest pressure:    Shortness of breath upon exertion:    Short of breath when lying flat:    Irregular heart rhythm:        Vascular    Pain in calf, thigh, or hip brought on by ambulation:    Pain in feet at night that wakes you up from your sleep:     Blood clot in your veins:    Leg swelling:         Pulmonary    Oxygen at home:    Productive cough:     Wheezing:         Neurologic    Sudden weakness in arms or legs:     Sudden numbness in arms or legs:     Sudden onset of difficulty speaking or slurred speech:    Temporary loss of vision in one eye:     Problems with dizziness:         Gastrointestinal    Blood in stool:     Vomited blood:         Genitourinary    Burning when urinating:     Blood in urine:        Psychiatric    Major depression:         Hematologic    Bleeding problems:    Problems with blood clotting too easily:        Skin    Rashes or ulcers:        Constitutional    Fever or chills:      PHYSICAL EXAM:   Vitals:   10/11/16 1214 10/11/16 1216  BP: (!) 170/87 (!) 143/79  Pulse: 74   SpO2: 100%   Weight: 151 lb (68.5 kg)   Height: 5\' 3"  (1.6 m)     GENERAL: The patient is a well-nourished female, in no acute distress. The vital signs are documented above. CARDIAC: There is a regular rate and rhythm.  VASCULAR: No carotid bruits.  Palpable radial pulses PULMONARY: Non-labored respirations MUSCULOSKELETAL: There are no major deformities or cyanosis. NEUROLOGIC: No focal weakness or paresthesias are detected. SKIN: There are no ulcers or rashes noted.  PSYCHIATRIC: The patient has a normal affect.  STUDIES:   I have ordered and reviewed her carotid duplex study.   This shows 60-79% left carotid stenosis and 40-59 percent right carotid stenosis.  MEDICAL ISSUES:   We discussed continued surveillance of her carotid occlusive disease.  She has not had any further neurologic events since February.  I suspect that the events in February for more related to anxiety and hypertension rather than her carotid disease.  She will continue with cholesterol management with a statin as well as her aspirin and Plavix and blood pressure control.  I have her scheduled for follow-up in 6 months with a repeat duplex.    Durene Cal, MD [facility] [phone] Pager [phone]

## 2016-10-20 NOTE — Addendum Note (Signed)
Addended by: Burton ApleyPETTY, Kendrick Remigio A on: 10/20/2016 09:23 AM   Modules accepted: Orders

## 2016-11-19 ENCOUNTER — Other Ambulatory Visit: Payer: Self-pay | Admitting: Family

## 2016-11-19 ENCOUNTER — Other Ambulatory Visit: Payer: Self-pay | Admitting: Cardiovascular Disease

## 2016-11-19 NOTE — Telephone Encounter (Signed)
Rx(s) sent to pharmacy electronically.  

## 2017-01-03 DIAGNOSIS — W57XXXA Bitten or stung by nonvenomous insect and other nonvenomous arthropods, initial encounter: Secondary | ICD-10-CM | POA: Diagnosis not present

## 2017-01-03 DIAGNOSIS — I2510 Atherosclerotic heart disease of native coronary artery without angina pectoris: Secondary | ICD-10-CM | POA: Diagnosis not present

## 2017-01-03 DIAGNOSIS — E7800 Pure hypercholesterolemia, unspecified: Secondary | ICD-10-CM | POA: Diagnosis not present

## 2017-01-03 DIAGNOSIS — M2550 Pain in unspecified joint: Secondary | ICD-10-CM | POA: Diagnosis not present

## 2017-01-17 DIAGNOSIS — B029 Zoster without complications: Secondary | ICD-10-CM | POA: Diagnosis not present

## 2017-03-07 ENCOUNTER — Other Ambulatory Visit: Payer: Self-pay | Admitting: Cardiovascular Disease

## 2017-03-08 ENCOUNTER — Other Ambulatory Visit: Payer: Self-pay | Admitting: Physician Assistant

## 2017-03-08 DIAGNOSIS — Z139 Encounter for screening, unspecified: Secondary | ICD-10-CM | POA: Diagnosis not present

## 2017-03-08 DIAGNOSIS — I10 Essential (primary) hypertension: Secondary | ICD-10-CM | POA: Diagnosis not present

## 2017-03-08 DIAGNOSIS — I6523 Occlusion and stenosis of bilateral carotid arteries: Secondary | ICD-10-CM | POA: Diagnosis not present

## 2017-03-08 DIAGNOSIS — Z1231 Encounter for screening mammogram for malignant neoplasm of breast: Secondary | ICD-10-CM

## 2017-03-08 DIAGNOSIS — Z9181 History of falling: Secondary | ICD-10-CM | POA: Diagnosis not present

## 2017-03-08 DIAGNOSIS — E7800 Pure hypercholesterolemia, unspecified: Secondary | ICD-10-CM | POA: Diagnosis not present

## 2017-03-08 DIAGNOSIS — R7303 Prediabetes: Secondary | ICD-10-CM | POA: Diagnosis not present

## 2017-03-08 DIAGNOSIS — I2510 Atherosclerotic heart disease of native coronary artery without angina pectoris: Secondary | ICD-10-CM | POA: Diagnosis not present

## 2017-03-08 DIAGNOSIS — N183 Chronic kidney disease, stage 3 (moderate): Secondary | ICD-10-CM | POA: Diagnosis not present

## 2017-03-17 DIAGNOSIS — Z1212 Encounter for screening for malignant neoplasm of rectum: Secondary | ICD-10-CM | POA: Diagnosis not present

## 2017-03-17 DIAGNOSIS — Z136 Encounter for screening for cardiovascular disorders: Secondary | ICD-10-CM | POA: Diagnosis not present

## 2017-03-17 DIAGNOSIS — Z0000 Encounter for general adult medical examination without abnormal findings: Secondary | ICD-10-CM | POA: Diagnosis not present

## 2017-03-17 DIAGNOSIS — Z1211 Encounter for screening for malignant neoplasm of colon: Secondary | ICD-10-CM | POA: Diagnosis not present

## 2017-03-17 DIAGNOSIS — E785 Hyperlipidemia, unspecified: Secondary | ICD-10-CM | POA: Diagnosis not present

## 2017-04-07 ENCOUNTER — Ambulatory Visit
Admission: RE | Admit: 2017-04-07 | Discharge: 2017-04-07 | Disposition: A | Discharge status: Home / Self Care | Payer: Medicare Other | Source: Ambulatory Visit | Attending: Physician Assistant | Admitting: Physician Assistant

## 2017-04-07 DIAGNOSIS — Z1231 Encounter for screening mammogram for malignant neoplasm of breast: Secondary | ICD-10-CM

## 2017-04-11 ENCOUNTER — Ambulatory Visit: Payer: Medicare Other | Admitting: Surgery

## 2017-04-11 ENCOUNTER — Ambulatory Visit (HOSPITAL_COMMUNITY)
Admission: RE | Admit: 2017-04-11 | Discharge: 2017-04-11 | Disposition: A | Discharge status: Home / Self Care | Payer: Medicare Other | Source: Ambulatory Visit | Attending: Surgery | Admitting: Surgery

## 2017-04-11 ENCOUNTER — Other Ambulatory Visit: Payer: Self-pay

## 2017-04-11 ENCOUNTER — Encounter: Payer: Self-pay | Admitting: Surgery

## 2017-04-11 VITALS — BP 147/89 | HR 95 | Temp 99.3°F | Resp 16 | Ht 63.0 in | Wt 153.0 lb

## 2017-04-11 DIAGNOSIS — I6523 Occlusion and stenosis of bilateral carotid arteries: Secondary | ICD-10-CM | POA: Diagnosis not present

## 2017-04-11 LAB — VAS US CAROTID
LEFT ECA DIAS: 45 cm/s
LEFT VERTEBRAL DIAS: 23 cm/s
LICAPDIAS: -67 cm/s
Left CCA dist dias: -26 cm/s
Left CCA dist sys: -95 cm/s
Left CCA prox dias: 30 cm/s
Left CCA prox sys: 121 cm/s
Left ICA dist dias: -24 cm/s
Left ICA dist sys: -90 cm/s
Left ICA prox sys: -287 cm/s
RCCADSYS: -114 cm/s
RCCAPDIAS: 31 cm/s
RIGHT CCA MID DIAS: -33 cm/s
RIGHT ECA DIAS: -24 cm/s
RIGHT VERTEBRAL DIAS: 17 cm/s
Right CCA prox sys: 212 cm/s

## 2017-04-11 NOTE — Progress Notes (Signed)
[facility] [place]  Patient name: Jennifer [name] MRN: [id number] DOB: 08/08/1940 Sex: female   REASON FOR VISIT:    Follow up  HISOTRY OF PRESENT ILLNESS:   Jennifer [name] is a 77 y.o. female returns today for follow-up.  In November 2011 she underwent aortic arch reconstruction with bypass graft to the innominate, left carotid, and left subclavian artery.  This was done simultaneously with CABG.  She has also undergone a right carotid endarterectomy in March 2013 for asymptomatic stenosis.  She is here today without complaints.  She denies any neurologic symptoms.   PAST MEDICAL HISTORY:   Past Medical History:  Diagnosis Date  . Anemia   . Arthritis   . Blood transfusion    1965  . Carotid artery occlusion   . Coronary artery disease Oct. 2011  . Eyesight diminished Change in eyesight  . GERD (gastroesophageal reflux disease)   . Headache(784.0)   . Hypercholesterolemia   . Hypertension   . Joint pain   . Shortness of breath on exertion      FAMILY HISTORY:   Family History  Problem Relation Age of Onset  . Diabetes Mother        Late onset  . Hyperlipidemia Mother   . Hypertension Mother   . Heart attack Mother   . Diabetes Sister        Late onset  . Hyperlipidemia Sister   . Hypertension Sister        AAA  . Cancer Brother        Prostate  . Heart disease Brother   . Hypertension Brother   . Cancer Brother        Prostate     SOCIAL HISTORY:   Social History   Tobacco Use  . Smoking status: Former Smoker    Years: 25.00    Last attempt to quit: 12/16/2009    Years since quitting: 7.3  . Smokeless tobacco: Never Used  Substance Use Topics  . Alcohol use: Yes    Comment: some     ALLERGIES:   Allergies  Allergen Reactions  . Statins Anaphylaxis    High dose ( Crestor 20 mg , pt cannot take)  . Penicillins Hives and Rash    Has patient had a PCN reaction causing immediate rash,  facial/tongue/throat swelling, SOB or lightheadedness with hypotension:YES Has patient had a PCN reaction causing severe rash involving mucus membranes or skin necrosis: NO Has patient had a PCN reaction that required hospitalization NO Has patient had a PCN reaction occurring within the last 10 years: NO If all of the above answers are "NO", then may proceed with Cephalosporin use.     CURRENT MEDICATIONS:   Current Outpatient Medications  Medication Sig Dispense Refill  . ALPHA LIPOIC ACID PO Take 250 mg by mouth daily.    Marland Kitchen. amLODipine (NORVASC) 10 MG tablet TAKE 1 TABLET BY MOUTH DAILY. 90 tablet 3  . cholecalciferol (VITAMIN D) 1000 UNITS tablet Take 1,000 Units by mouth daily.     . clopidogrel (PLAVIX) 75 MG tablet TAKE 1 TABLET BY MOUTH DAILY. 30 tablet 6  . co-enzyme Q-10 50 MG capsule Take 100 mg by mouth daily.    . Flaxseed, Linseed, 1000 MG CAPS Take 1 capsule by mouth daily.     . Garlic (GARLIQUE PO) Take 600 mg by mouth daily.     . Magnesium 250 MG TABS Take 1 tablet by mouth daily.    .Marland Kitchen  metoprolol succinate (TOPROL-XL) 25 MG 24 hr tablet TAKE 1/2 TABLET BY MOUTH ONCE A DAY 30 tablet 2  . Multiple Vitamin (MULITIVITAMIN WITH MINERALS) TABS Take 1 tablet by mouth daily.    . Omega-3 Fatty Acids (FISH OIL) 1000 MG CAPS Take 1,000 mg by mouth daily. Mega Krill Fish oil    . Polyethyl Glycol-Propyl Glycol (SYSTANE OP) Place 1 drop into both eyes daily as needed. For dryt eyes    . rosuvastatin (CRESTOR) 10 MG tablet Take 10 mg by mouth 3 (three) times a week. On Monday, Wednesday, and Friday     . valACYclovir (VALTREX) 1000 MG tablet   0  . vitamin B-12 (CYANOCOBALAMIN) 500 MCG tablet Take 500 mcg by mouth daily.    . Vitamin Mixture (ESTER-C) 500-60 MG TABS Take 500 mg by mouth daily.     No current facility-administered medications for this visit.     REVIEW OF SYSTEMS:   [X]  denotes positive finding, [ ]  denotes negative finding Cardiac  Comments:  Chest pain or  chest pressure:    Shortness of breath upon exertion:    Short of breath when lying flat:    Irregular heart rhythm:        Vascular    Pain in calf, thigh, or hip brought on by ambulation:    Pain in feet at night that wakes you up from your sleep:     Blood clot in your veins:    Leg swelling:         Pulmonary    Oxygen at home:    Productive cough:     Wheezing:         Neurologic    Sudden weakness in arms or legs:     Sudden numbness in arms or legs:     Sudden onset of difficulty speaking or slurred speech:    Temporary loss of vision in one eye:     Problems with dizziness:         Gastrointestinal    Blood in stool:     Vomited blood:         Genitourinary    Burning when urinating:     Blood in urine:        Psychiatric    Major depression:         Hematologic    Bleeding problems:    Problems with blood clotting too easily:        Skin    Rashes or ulcers:        Constitutional    Fever or chills:      PHYSICAL EXAM:   Vitals:   04/11/17 1525 04/11/17 1530 04/11/17 1532  BP: (!) 156/97 (!) 156/91 (!) 147/89  Pulse: 95 95 95  Resp: 16    Temp: 99.3 F (37.4 C)    TempSrc: Oral    SpO2: 96%    Weight: 153 lb (69.4 kg)    Height: 5\' 3"  (1.6 m)      GENERAL: The patient is a well-nourished female, in no acute distress. The vital signs are documented above. CARDIAC: There is a regular rate and rhythm.  VASCULAR: Right carotid bruit.  Palpable radial pulses bilaterally. PULMONARY: Non-labored respirations MUSCULOSKELETAL: There are no major deformities or cyanosis. NEUROLOGIC: No focal weakness or paresthesias are detected. SKIN: There are no ulcers or rashes noted. PSYCHIATRIC: The patient has a normal affect.  STUDIES:   I have ordered and reviewed her carotid duplex.  Shows  widely patent right carotid endarterectomy site.  There is 60-79% stenosis within the left carotid artery.  End-diastolic velocity was 67  MEDICAL ISSUES:    Carotid stenosis: The patient's disease remained stable.  She continues to be asymptomatic.  We have recommended repeat surveillance ultrasound in 1 year.  She will contact me if she develops any symptoms.    Durene Cal, MD [facility] [phone] Pager [phone]

## 2017-04-18 ENCOUNTER — Ambulatory Visit: Payer: Medicare Other | Admitting: Surgery

## 2017-04-18 ENCOUNTER — Encounter (HOSPITAL_COMMUNITY): Payer: Medicare Other

## 2017-05-10 ENCOUNTER — Encounter: Payer: Self-pay | Admitting: Cardiovascular Disease

## 2017-05-10 ENCOUNTER — Ambulatory Visit: Payer: Medicare Other | Admitting: Cardiovascular Disease

## 2017-05-10 VITALS — BP 150/84 | HR 91 | Ht 63.0 in | Wt 156.6 lb

## 2017-05-10 DIAGNOSIS — I2510 Atherosclerotic heart disease of native coronary artery without angina pectoris: Secondary | ICD-10-CM

## 2017-05-10 DIAGNOSIS — E7800 Pure hypercholesterolemia, unspecified: Secondary | ICD-10-CM

## 2017-05-10 DIAGNOSIS — I10 Essential (primary) hypertension: Secondary | ICD-10-CM

## 2017-05-10 DIAGNOSIS — I739 Peripheral vascular disease, unspecified: Secondary | ICD-10-CM | POA: Diagnosis not present

## 2017-05-10 NOTE — Assessment & Plan Note (Signed)
History of CAD status post coronary artery bypass grafting 4  12/28/09 by Dr. Laneta SimmersBartle . Her last Myoview performed 03/24/16 was nonischemic and normal. She denies chest pain that is chronic soreness of breath which has not changed in frequency or severity. She did stop smoking 9 years ago.

## 2017-05-10 NOTE — Patient Instructions (Signed)

## 2017-05-10 NOTE — Progress Notes (Signed)
05/10/2017 Jennifer [name]   08/17/1940  [id number]  Primary Physician Lonie Peakonroy, Nathan, PA-C Primary Cardiologist: Runell GessJonathan J Drey Shaff MD Nicholes CalamityFACP, FACC, FAHA, [place]  HPI:  Jennifer [name] is a [age] y.o.  mildly overweight married Caucasian female mother of 2 children, grandmother and one grandchild was formally a patient of Dr. Algie CofferKadakia.I last saw her in the office 05/11/16. 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 Risk factors include discontinue tobacco use having quit in November of 2011 and smoked 25 pack years. She has treated hypertension and dyslipidemia. She has a history of left carotid to subclavian artery bypass grafting by Dr. Myra GianottiBrabham as post coronary artery bypass grafting x411/13/11 by Dr. Rexanne ManoBrian Bartle. She's also had a lack of right carotid endarterectomy performed by Dr. Myra GianottiBrabham 3/13 He follows her as an outpatient noninvasively. She denies chest pain or shortness of breath, or claudication. She was recently hospitalized overnight 03/24/16 for "diplopia and was evaluated by Dr. Arbie CookeyEarly at that time. She did have a Myoview performed 03/24/16 which was entirely normal. Since I saw her a year ago she's remained stable benign chest pain or changes in her breathing.     Current Meds  Medication Sig  . ALPHA LIPOIC ACID PO Take 250 mg by mouth daily.  Marland Kitchen. amLODipine (NORVASC) 10 MG tablet TAKE 1 TABLET BY MOUTH DAILY.  . cholecalciferol (VITAMIN D) 1000 UNITS tablet Take 1,000 Units by mouth daily.   . clopidogrel (PLAVIX) 75 MG tablet TAKE 1 TABLET BY MOUTH DAILY.  Marland Kitchen. co-enzyme Q-10 50 MG capsule Take 100 mg by mouth daily.  . Flaxseed, Linseed, 1000 MG CAPS Take 1 capsule by mouth daily.   . Garlic (GARLIQUE PO) Take 600 mg by mouth daily.   . Magnesium 250 MG TABS Take 1 tablet by mouth daily.  . metoprolol succinate (TOPROL-XL) 25 MG 24 hr tablet TAKE 1/2 TABLET BY MOUTH ONCE A DAY  . Multiple Vitamin (MULITIVITAMIN WITH MINERALS) TABS Take 1 tablet by mouth daily.  . Omega-3 Fatty  Acids (FISH OIL) 1000 MG CAPS Take 1,000 mg by mouth daily. Mega Krill Fish oil  . Polyethyl Glycol-Propyl Glycol (SYSTANE OP) Place 1 drop into both eyes daily as needed. For dryt eyes  . rosuvastatin (CRESTOR) 10 MG tablet Take 10 mg by mouth 3 (three) times a week. On Monday, Wednesday, and Friday   . valACYclovir (VALTREX) 1000 MG tablet   . vitamin B-12 (CYANOCOBALAMIN) 500 MCG tablet Take 500 mcg by mouth daily.  . Vitamin Mixture (ESTER-C) 500-60 MG TABS Take 500 mg by mouth daily.     Allergies  Allergen Reactions  . Statins Anaphylaxis    High dose ( Crestor 20 mg , pt cannot take)  . Penicillins Hives and Rash    Has patient had a PCN reaction causing immediate rash, facial/tongue/throat swelling, SOB or lightheadedness with hypotension:YES Has patient had a PCN reaction causing severe rash involving mucus membranes or skin necrosis: NO Has patient had a PCN reaction that required hospitalization NO Has patient had a PCN reaction occurring within the last 10 years: NO If all of the above answers are "NO", then may proceed with Cephalosporin use.    Social History   Socioeconomic History  . Marital status: Married    Spouse name: Not on file  . Number of children: Not on file  . Years of education: Not on file  . Highest education level: Not on file  Occupational History  . Occupation:  retired  Engineer, production  . Financial resource strain: Not on file  . Food insecurity:    Worry: Not on file    Inability: Not on file  . Transportation needs:    Medical: Not on file    Non-medical: Not on file  Tobacco Use  . Smoking status: Former Smoker    Years: 25.00    Last attempt to quit: 12/16/2009    Years since quitting: 7.4  . Smokeless tobacco: Never Used  Substance and Sexual Activity  . Alcohol use: Yes    Comment: some  . Drug use: No  . Sexual activity: Not on file  Lifestyle  . Physical activity:    Days per week: Not on file    Minutes per session: Not on  file  . Stress: Not on file  Relationships  . Social connections:    Talks on phone: Not on file    Gets together: Not on file    Attends religious service: Not on file    Active member of club or organization: Not on file    Attends meetings of clubs or organizations: Not on file    Relationship status: Not on file  . Intimate partner violence:    Fear of current or ex partner: Not on file    Emotionally abused: Not on file    Physically abused: Not on file    Forced sexual activity: Not on file  Other Topics Concern  . Not on file  Social History Narrative  . Not on file     Review of Systems: General: negative for chills, fever, night sweats or weight changes.  Cardiovascular: negative for chest pain, dyspnea on exertion, edema, orthopnea, palpitations, paroxysmal nocturnal dyspnea or shortness of breath Dermatological: negative for rash Respiratory: negative for cough or wheezing Urologic: negative for hematuria Abdominal: negative for nausea, vomiting, diarrhea, bright red blood per rectum, melena, or hematemesis Neurologic: negative for visual changes, syncope, or dizziness All other systems reviewed and are otherwise negative except as noted above.    Blood pressure (!) 150/84, pulse 91, height 5\' 3"  (1.6 m), weight 156 lb 9.6 oz (71 kg).  General appearance: alert Neck: no adenopathy, no carotid bruit, no JVD, supple, symmetrical, trachea midline and thyroid not enlarged, symmetric, no tenderness/mass/nodules Lungs: clear to auscultation bilaterally Heart: regular rate and rhythm, S1, S2 normal, no murmur, click, rub or gallop Extremities: extremities normal, atraumatic, no cyanosis or edema Pulses: 2+ and symmetric Skin: Skin color, texture, turgor normal. No rashes or lesions Neurologic: Alert and oriented X 3, normal strength and tone. Normal symmetric reflexes. Normal coordination and gait  EKG sinus rhythm 91 with nonspecific ST and T-wave changes. I personally  reviewed this EKG.  ASSESSMENT AND PLAN:   CABG x 4 2011 History of CAD status post coronary artery bypass grafting 4  12/28/09 by Dr. Laneta Simmers . Her last Myoview performed 03/24/16 was nonischemic and normal. She denies chest pain that is chronic soreness of breath which has not changed in frequency or severity. She did stop smoking 9 years ago.  Essential hypertension History of essential hypertension blood pressure measured at 150/84. She is on amlodipine and metoprolol. Continue current meds at current dosing.  Hyperlipidemia History of hyperlipidemia on Crestor 3 times a week. She did have a reaction on higher dose Crestor. This is followed by her PCP  PVD- Lt CCA/Lt SCA BPG Nov 2011 and RCEA 2013 with residual moderate LICA disase History of peripheral arterial disease status  post left carotid to subclavian bypass by Dr. Myra Gianotti as well as right carotid endarterectomy which he follows as an outpatient.      Runell Gess MD FACP,FACC,FAHA, [facility] 05/10/2017 11:06 AM

## 2017-05-10 NOTE — Assessment & Plan Note (Signed)
History of hyperlipidemia on Crestor 3 times a week. She did have a reaction on higher dose Crestor. This is followed by her PCP

## 2017-05-10 NOTE — Assessment & Plan Note (Signed)
History of peripheral arterial disease status post left carotid to subclavian bypass by Dr. Myra GianottiBrabham as well as right carotid endarterectomy which he follows as an outpatient.

## 2017-05-10 NOTE — Assessment & Plan Note (Signed)
History of essential hypertension blood pressure measured at 150/84. She is on amlodipine and metoprolol. Continue current meds at current dosing.

## 2017-05-20 ENCOUNTER — Other Ambulatory Visit: Payer: Self-pay | Admitting: Cardiovascular Disease

## 2017-05-20 NOTE — Telephone Encounter (Signed)
Rx has been sent to the pharmacy electronically. ° °

## 2017-06-20 ENCOUNTER — Other Ambulatory Visit: Payer: Self-pay | Admitting: Vascular Surgery

## 2018-01-14 ENCOUNTER — Other Ambulatory Visit: Payer: Self-pay | Admitting: Vascular Surgery

## 2018-02-13 ENCOUNTER — Other Ambulatory Visit: Payer: Self-pay | Admitting: Cardiovascular Disease

## 2018-03-01 ENCOUNTER — Telehealth: Payer: Self-pay | Admitting: Cardiovascular Disease

## 2018-03-01 ENCOUNTER — Other Ambulatory Visit: Payer: Self-pay | Admitting: Cardiovascular Disease

## 2018-03-01 NOTE — Telephone Encounter (Signed)
Spoke with pt who states she has been experiencing chest tightness off and on for the past 3 days. She reports it feels more like indigestion and she's been belching but it still concern her as her BP increases with each episode. She report BP was 177 yesterday and today 172 but returns to normal each time. Pt denies any chest pain at this time. Appointment scheduled for tomorrow 1/16 at 830 am with Corine Shelter, PA. Pt advised to report to ED if symptoms reoccur. Pt verbalized understanding.

## 2018-03-01 NOTE — Telephone Encounter (Signed)
New Message:   Patient calling because she is not feel well and wold like to see someone tomorrow. Patient states her BP gose up and down and she feeling tightness in her chest. She would like for a nurse to call her back.

## 2018-03-02 ENCOUNTER — Telehealth (HOSPITAL_COMMUNITY): Payer: Self-pay

## 2018-03-02 ENCOUNTER — Encounter: Payer: Self-pay | Admitting: Cardiology

## 2018-03-02 ENCOUNTER — Telehealth: Payer: Self-pay

## 2018-03-02 ENCOUNTER — Ambulatory Visit: Payer: Medicare Other | Admitting: Cardiology

## 2018-03-02 VITALS — BP 140/74 | HR 90 | Ht 63.0 in | Wt 154.0 lb

## 2018-03-02 DIAGNOSIS — I739 Peripheral vascular disease, unspecified: Secondary | ICD-10-CM | POA: Diagnosis not present

## 2018-03-02 DIAGNOSIS — Z951 Presence of aortocoronary bypass graft: Secondary | ICD-10-CM

## 2018-03-02 DIAGNOSIS — I10 Essential (primary) hypertension: Secondary | ICD-10-CM | POA: Diagnosis not present

## 2018-03-02 DIAGNOSIS — R079 Chest pain, unspecified: Secondary | ICD-10-CM | POA: Diagnosis not present

## 2018-03-02 DIAGNOSIS — F439 Reaction to severe stress, unspecified: Secondary | ICD-10-CM | POA: Insufficient documentation

## 2018-03-02 MED ORDER — ISOSORBIDE MONONITRATE ER 30 MG PO TB24: 15.0000 mg | ORAL_TABLET | Freq: Every day | ORAL | 6 refills | Status: DC

## 2018-03-02 MED ORDER — NITROGLYCERIN 0.4 MG SL SUBL: 0.4000 mg | SUBLINGUAL_TABLET | SUBLINGUAL | 3 refills | Status: DC | PRN

## 2018-03-02 MED ORDER — PANTOPRAZOLE SODIUM 40 MG PO TBEC: 40.0000 mg | DELAYED_RELEASE_TABLET | Freq: Every day | ORAL | 10 refills | Status: DC

## 2018-03-02 MED ORDER — NITROGLYCERIN 0.4 MG SL SUBL: 0.4000 mg | SUBLINGUAL_TABLET | SUBLINGUAL | 3 refills | Status: AC | PRN

## 2018-03-02 NOTE — Assessment & Plan Note (Signed)
Controlled.  

## 2018-03-02 NOTE — Assessment & Plan Note (Signed)
Secondary to husbands illness

## 2018-03-02 NOTE — Patient Instructions (Addendum)
Medication Instructions:  START Imdur 15mg  Tablet comes in 30mg  break tablet in half and take half tablet once a day START Protonix 40mg  Take 1 tablet once a day  START Nitrostat Take 1 tablet as needed for emergency chest pain, WAIT 5 MINUTES BEFORE TAKING SECOND DOSE after taking your SECOND DOSE tablet contact 911 DO NOT TAKE MORE THAN 3 DOSES   If you need a refill on your cardiac medications before your next appointment, please call your pharmacy.   Lab work: NONE  If you have labs (blood work) drawn today and your tests are completely normal, you will receive your results only by: Marland Kitchen MyChart Message (if you have MyChart) OR . A paper copy in the mail If you have any lab test that is abnormal or we need to change your treatment, we will call you to review the results.  Testing/Procedures: Your physician has requested that you have a lexiscan myoview. For further information please visit [email or web address] Please follow instruction sheet, as given. TEST IS ORDERED STAT  Follow-Up: At [facility], you and your health needs are our priority.  As part of our continuing mission to provide you with exceptional heart care, we have created designated Provider Care Teams.  These Care Teams include your primary Cardiologist (physician) and Advanced Practice Providers (APPs -  Physician Assistants and Nurse Practitioners) who all work together to provide you with the care you need, when you need it.  . Your physician recommends that you schedule a follow-up appointment in: 3-4 WEEKS WITH LUKE IN 4PM SLOT.  Any Other Special Instructions Will Be Listed Below (If Applicable).

## 2018-03-02 NOTE — Telephone Encounter (Signed)
Encounter complete. 

## 2018-03-02 NOTE — Assessment & Plan Note (Signed)
Seen today for chest pain. Her symptoms are not exertional, possibly GI.

## 2018-03-02 NOTE — Telephone Encounter (Signed)
Spoke with pt who states she was seen in office today but her Rx was sent to the wrong pharmacy. New Rx sent to [facility].

## 2018-03-02 NOTE — Assessment & Plan Note (Addendum)
CABG x 4 2011  -Low risk Myoview feb 2018

## 2018-03-02 NOTE — Addendum Note (Signed)
Addended by: Kandice Robinsons T on: 03/02/2018 09:22 AM   Modules accepted: Orders

## 2018-03-02 NOTE — Progress Notes (Signed)
03/02/2018 Jennifer [name]   1940-10-15  [id number]  Primary Physician Lonie Peak, PA-C Primary Cardiologist: Dr Allyson Sabal  HPI:  78 year-old mildly overweight married Caucasian female followed by Dr Allyson Sabal. She is a retired Museum/gallery exhibitions officer for [employer] and her niece, [name], is an Charity fundraiser at the surgical center. The pt has a history of tobacco use, having quit in November of 2011 after 25 pack years. She has hypertension, mild CRI, and dyslipidemia. She has a history of left carotid to subclavian artery bypass grafting by Dr. Myra Gianotti prior to coronary artery bypass grafting x4 in Nov 2011 by Dr Rexanne Mano.  She then had right carotid endarterectomy performed by Dr. Myra Gianotti March 2013.   She is in the office today with complaints of epigastric pain.  She says she has been under a great deal of stress recently recently, her husband has been diagnosed with atrial fibrillation.  Earlier this week she noted an episode of epigastric discomfort at rest.  She says it was relieved with Tums.  She has had another episode, also at rest.  Is associated with mild nausea.  She denies any radiation to her jaw arms or back.  She has not had any exertional symptoms.   Current Outpatient Medications  Medication Sig Dispense Refill  . ALPHA LIPOIC ACID PO Take 250 mg by mouth daily.    Marland Kitchen amLODipine (NORVASC) 10 MG tablet TAKE 1 TABLET BY MOUTH DAILY. 90 tablet 0  . cholecalciferol (VITAMIN D) 1000 UNITS tablet Take 1,000 Units by mouth daily.     . clopidogrel (PLAVIX) 75 MG tablet TAKE 1 TABLET BY MOUTH DAILY. 30 tablet 6  . co-enzyme Q-10 50 MG capsule Take 100 mg by mouth daily.    . Flaxseed, Linseed, 1000 MG CAPS Take 1 capsule by mouth daily.     . Garlic (GARLIQUE PO) Take 600 mg by mouth daily.     . Magnesium 250 MG TABS Take 1 tablet by mouth daily.    . metoprolol succinate (TOPROL-XL) 25 MG 24 hr tablet TAKE 1/2 TABLET BY MOUTH ONCE A DAY 45 tablet 2  . Multiple Vitamin (MULITIVITAMIN WITH  MINERALS) TABS Take 1 tablet by mouth daily.    . Omega-3 Fatty Acids (FISH OIL) 1000 MG CAPS Take 1,000 mg by mouth daily. Mega Krill Fish oil    . Polyethyl Glycol-Propyl Glycol (SYSTANE OP) Place 1 drop into both eyes daily as needed. For dryt eyes    . rosuvastatin (CRESTOR) 10 MG tablet Take 10 mg by mouth 3 (three) times a week. On Monday, Wednesday, and Friday     . valACYclovir (VALTREX) 1000 MG tablet   0  . vitamin B-12 (CYANOCOBALAMIN) 500 MCG tablet Take 500 mcg by mouth daily.    . Vitamin Mixture (ESTER-C) 500-60 MG TABS Take 500 mg by mouth daily.    . isosorbide mononitrate (IMDUR) 30 MG 24 hr tablet Take 0.5 tablets (15 mg total) by mouth daily for 30 days. 30 tablet 6  . nitroGLYCERIN (NITROSTAT) 0.4 MG SL tablet Place 1 tablet (0.4 mg total) under the tongue every 5 (five) minutes as needed for chest pain. 25 tablet 3  . pantoprazole (PROTONIX) 40 MG tablet Take 1 tablet (40 mg total) by mouth daily. 30 tablet 10   No current facility-administered medications for this visit.     Allergies  Allergen Reactions  . Statins Anaphylaxis    High dose ( Crestor 20 mg , pt cannot take)  .  Penicillins Hives and Rash    Has patient had a PCN reaction causing immediate rash, facial/tongue/throat swelling, SOB or lightheadedness with hypotension:YES Has patient had a PCN reaction causing severe rash involving mucus membranes or skin necrosis: NO Has patient had a PCN reaction that required hospitalization NO Has patient had a PCN reaction occurring within the last 10 years: NO If all of the above answers are "NO", then may proceed with Cephalosporin use.    Past Medical History:  Diagnosis Date  . Anemia   . Arthritis   . Blood transfusion    1965  . Carotid artery occlusion   . Coronary artery disease Oct. 2011  . Eyesight diminished Change in eyesight  . GERD (gastroesophageal reflux disease)   . Headache(784.0)   . Hypercholesterolemia   . Hypertension   . Joint pain     . Shortness of breath on exertion     Social History   Socioeconomic History  . Marital status: Married    Spouse name: Not on file  . Number of children: Not on file  . Years of education: Not on file  . Highest education level: Not on file  Occupational History  . Occupation: retired  Engineer, productionocial Needs  . Financial resource strain: Not on file  . Food insecurity:    Worry: Not on file    Inability: Not on file  . Transportation needs:    Medical: Not on file    Non-medical: Not on file  Tobacco Use  . Smoking status: Former Smoker    Years: 25.00    Last attempt to quit: 12/16/2009    Years since quitting: 8.2  . Smokeless tobacco: Never Used  Substance and Sexual Activity  . Alcohol use: Yes    Comment: some  . Drug use: No  . Sexual activity: Not on file  Lifestyle  . Physical activity:    Days per week: Not on file    Minutes per session: Not on file  . Stress: Not on file  Relationships  . Social connections:    Talks on phone: Not on file    Gets together: Not on file    Attends religious service: Not on file    Active member of club or organization: Not on file    Attends meetings of clubs or organizations: Not on file    Relationship status: Not on file  . Intimate partner violence:    Fear of current or ex partner: Not on file    Emotionally abused: Not on file    Physically abused: Not on file    Forced sexual activity: Not on file  Other Topics Concern  . Not on file  Social History Narrative  . Not on file     Family History  Problem Relation Age of Onset  . Diabetes Mother        Late onset  . Hyperlipidemia Mother   . Hypertension Mother   . Heart attack Mother   . Diabetes Sister        Late onset  . Hyperlipidemia Sister   . Hypertension Sister        AAA  . Cancer Brother        Prostate  . Heart disease Brother   . Hypertension Brother   . Cancer Brother        Prostate      Review of Systems: General: negative for chills,  fever, night sweats or weight changes.  Cardiovascular: negative for  chest pain, dyspnea on exertion, edema, orthopnea, palpitations, paroxysmal nocturnal dyspnea or shortness of breath Dermatological: negative for rash Respiratory: negative for cough or wheezing Urologic: negative for hematuria Abdominal: negative for nausea, vomiting, diarrhea, bright red blood per rectum, melena, or hematemesis Neurologic: negative for visual changes, syncope, or dizziness All other systems reviewed and are otherwise negative except as noted above.    Blood pressure 140/74, pulse 90, height 5\' 3"  (1.6 m), weight 154 lb (69.9 kg).  General appearance: alert, cooperative, no distress and mildly obese Neck: no JVD and LCA bruit, RCEA scar Lungs: clear to auscultation bilaterally Heart: regular rate and rhythm Extremities: no edema Skin: Skin color, texture, turgor normal. No rashes or lesions Neurologic: Grossly normal  EKG NSR  ASSESSMENT AND PLAN:   Chest pain Seen today for chest pain. Her symptoms are not exertional, possibly GI.  Hx of CABG CABG x 4 2011  -Low risk Myoview feb 2018  Essential hypertension Controlled  Stress at home Secondary to husbands illness   PLAN I'm concerned about Ms Brockschmidt symptoms.  I would like to get a stat Myoview.  I am going to add a PPI and low-dose nitrate and give her a prescription for sublingual nitroglycerin.  She knows to go to the emergency room if she has recurrent symptoms at home that are associated with shortness of breath or diaphoresis.  We will see her back in follow-up after she has her test.  Corine Shelter PA-C 03/02/2018 9:03 AM

## 2018-03-03 ENCOUNTER — Ambulatory Visit (HOSPITAL_COMMUNITY)
Admission: RE | Admit: 2018-03-03 | Discharge: 2018-03-03 | Disposition: A | Discharge status: Home / Self Care | Payer: Medicare Other | Source: Ambulatory Visit | Attending: Internal Medicine | Admitting: Internal Medicine

## 2018-03-03 DIAGNOSIS — Z951 Presence of aortocoronary bypass graft: Secondary | ICD-10-CM | POA: Diagnosis present

## 2018-03-03 DIAGNOSIS — I739 Peripheral vascular disease, unspecified: Secondary | ICD-10-CM | POA: Insufficient documentation

## 2018-03-03 DIAGNOSIS — I10 Essential (primary) hypertension: Secondary | ICD-10-CM | POA: Insufficient documentation

## 2018-03-03 DIAGNOSIS — R079 Chest pain, unspecified: Secondary | ICD-10-CM | POA: Insufficient documentation

## 2018-03-03 DIAGNOSIS — F439 Reaction to severe stress, unspecified: Secondary | ICD-10-CM | POA: Diagnosis present

## 2018-03-03 LAB — MYOCARDIAL PERFUSION IMAGING
LV dias vol: 46 mL (ref 46–106)
LV sys vol: 14 mL
Peak HR: 109 {beats}/min
Rest HR: 81 {beats}/min
SDS: 1
SRS: 0
SSS: 1
TID: 1.05

## 2018-03-03 MED ORDER — TECHNETIUM TC 99M TETROFOSMIN IV KIT
10.2000 | PACK | Freq: Once | INTRAVENOUS | Status: AC | PRN
  Administered 2018-03-03: 10.2 via INTRAVENOUS
  Filled 2018-03-03: qty 11

## 2018-03-03 MED ORDER — TECHNETIUM TC 99M TETROFOSMIN IV KIT
31.4000 | PACK | Freq: Once | INTRAVENOUS | Status: AC | PRN
  Administered 2018-03-03: 31.4 via INTRAVENOUS
  Filled 2018-03-03: qty 32

## 2018-03-03 MED ORDER — REGADENOSON 0.4 MG/5ML IV SOLN
0.4000 mg | Freq: Once | INTRAVENOUS | Status: AC
  Administered 2018-03-03: 0.4 mg via INTRAVENOUS

## 2018-03-21 DIAGNOSIS — R7303 Prediabetes: Secondary | ICD-10-CM | POA: Diagnosis not present

## 2018-03-21 DIAGNOSIS — Z9181 History of falling: Secondary | ICD-10-CM | POA: Diagnosis not present

## 2018-03-21 DIAGNOSIS — E7800 Pure hypercholesterolemia, unspecified: Secondary | ICD-10-CM | POA: Diagnosis not present

## 2018-03-21 DIAGNOSIS — I10 Essential (primary) hypertension: Secondary | ICD-10-CM | POA: Diagnosis not present

## 2018-03-21 DIAGNOSIS — N183 Chronic kidney disease, stage 3 (moderate): Secondary | ICD-10-CM | POA: Diagnosis not present

## 2018-03-21 DIAGNOSIS — I2510 Atherosclerotic heart disease of native coronary artery without angina pectoris: Secondary | ICD-10-CM | POA: Diagnosis not present

## 2018-03-28 ENCOUNTER — Encounter: Payer: Self-pay | Admitting: Cardiology

## 2018-03-28 ENCOUNTER — Ambulatory Visit: Payer: Medicare Other | Admitting: Cardiology

## 2018-03-28 ENCOUNTER — Other Ambulatory Visit: Payer: Self-pay

## 2018-03-28 VITALS — BP 158/80 | HR 95 | Wt 157.0 lb

## 2018-03-28 DIAGNOSIS — I739 Peripheral vascular disease, unspecified: Secondary | ICD-10-CM | POA: Diagnosis not present

## 2018-03-28 DIAGNOSIS — E7800 Pure hypercholesterolemia, unspecified: Secondary | ICD-10-CM

## 2018-03-28 DIAGNOSIS — Z951 Presence of aortocoronary bypass graft: Secondary | ICD-10-CM

## 2018-03-28 DIAGNOSIS — R079 Chest pain, unspecified: Secondary | ICD-10-CM | POA: Diagnosis not present

## 2018-03-28 DIAGNOSIS — F439 Reaction to severe stress, unspecified: Secondary | ICD-10-CM

## 2018-03-28 DIAGNOSIS — I10 Essential (primary) hypertension: Secondary | ICD-10-CM

## 2018-03-28 NOTE — Patient Instructions (Signed)
Medication Instructions:  Your physician recommends that you continue on your current medications as directed. Please refer to the Current Medication list given to you today. If you need a refill on your cardiac medications before your next appointment, please call your pharmacy.   Lab work: None  If you have labs (blood work) drawn today and your tests are completely normal, you will receive your results only by: Marland Kitchen MyChart Message (if you have MyChart) OR . A paper copy in the mail If you have any lab test that is abnormal or we need to change your treatment, we will call you to review the results.  Testing/Procedures: None   Follow-Up: At [facility], you and your health needs are our priority.  As part of our continuing mission to provide you with exceptional heart care, we have created designated Provider Care Teams.  These Care Teams include your primary Cardiologist (physician) and Advanced Practice Providers (APPs -  Physician Assistants and Nurse Practitioners) who all work together to provide you with the care you need, when you need it. Jennifer [name] recommends you follow up with him in 6 months. Please contact the office in June to schedule your next appointment.  Any Other Special Instructions Will Be Listed Below (If Applicable).

## 2018-03-28 NOTE — Progress Notes (Signed)
03/28/2018 Jennifer [name]   11/17/1940  [id number]  Primary Physician Lonie Peakonroy, Nathan, PA-C Primary Cardiologist: Dr Allyson SabalBerry  HPI:  78 year-old mildly overweight married Caucasian female followed by Dr Allyson SabalBerry. She is a retired Museum/gallery exhibitions officermedical transcriptionist for [employer] and her niece, Lanette HampshireKathy Duran, is an Charity fundraiserN at the surgical center. The pt has a history of tobacco use, having quit in November of 2011 after 25 pack years. She has hypertension, mild CRI, and dyslipidemia. She has a history of left carotid to subclavian artery bypass grafting by Dr. Myra GianottiBrabham prior to coronary artery bypass grafting x4 in Nov 2011 by Dr Rexanne ManoBrian Bartle.  She then had right carotid endarterectomy performed by Dr. Myra GianottiBrabham March 2013.   She was seen in the 03/02/2018 with complaints of epigastric pain.  She says she had been under a great deal of stress, her husband has been diagnosed with atrial fibrillation.  She had noted an episode of epigastric discomfort at rest.  She says it was relieved with Tums.  She then had another episode, also at rest and it was associated with mild nausea.  She denied any radiation to her jaw arms or back.  She denied any exertional symptoms.  I added low dose nitrate and PPI and obtained a Myoview . She returns today for follow up.  Her Myoview was low risk.  Her symptoms improved after 3 days of Protonix.    Current Outpatient Medications  Medication Sig Dispense Refill  . ALPHA LIPOIC ACID PO Take 250 mg by mouth daily.    Marland Kitchen. amLODipine (NORVASC) 10 MG tablet TAKE 1 TABLET BY MOUTH DAILY. 90 tablet 0  . cholecalciferol (VITAMIN D) 1000 UNITS tablet Take 1,000 Units by mouth daily.     . clopidogrel (PLAVIX) 75 MG tablet TAKE 1 TABLET BY MOUTH DAILY. 30 tablet 6  . co-enzyme Q-10 50 MG capsule Take 100 mg by mouth daily.    . Flaxseed, Linseed, 1000 MG CAPS Take 1 capsule by mouth daily.     . Garlic (GARLIQUE PO) Take 600 mg by mouth daily.     . Magnesium 250 MG TABS Take 1 tablet by mouth daily.      . metoprolol succinate (TOPROL-XL) 25 MG 24 hr tablet TAKE 1/2 TABLET BY MOUTH ONCE A DAY 45 tablet 2  . Multiple Vitamin (MULITIVITAMIN WITH MINERALS) TABS Take 1 tablet by mouth daily.    . nitroGLYCERIN (NITROSTAT) 0.4 MG SL tablet Place 1 tablet (0.4 mg total) under the tongue every 5 (five) minutes as needed for chest pain. 25 tablet 3  . Omega-3 Fatty Acids (FISH OIL) 1000 MG CAPS Take 1,000 mg by mouth daily. Mega Krill Fish oil    . pantoprazole (PROTONIX) 40 MG tablet Take 1 tablet (40 mg total) by mouth daily. 30 tablet 10  . Polyethyl Glycol-Propyl Glycol (SYSTANE OP) Place 1 drop into both eyes daily as needed. For dryt eyes    . rosuvastatin (CRESTOR) 10 MG tablet Take 10 mg by mouth 3 (three) times a week. On Monday, Wednesday, and Friday     . valACYclovir (VALTREX) 1000 MG tablet   0  . vitamin B-12 (CYANOCOBALAMIN) 500 MCG tablet Take 500 mcg by mouth daily.    . Vitamin Mixture (ESTER-C) 500-60 MG TABS Take 500 mg by mouth daily.     No current facility-administered medications for this visit.     Allergies  Allergen Reactions  . Statins Anaphylaxis    High dose ( Crestor  20 mg , pt cannot take)  . Imdur [Isosorbide Nitrate] Other (See Comments)    Headache  . Penicillins Hives and Rash    Has patient had a PCN reaction causing immediate rash, facial/tongue/throat swelling, SOB or lightheadedness with hypotension:YES Has patient had a PCN reaction causing severe rash involving mucus membranes or skin necrosis: NO Has patient had a PCN reaction that required hospitalization NO Has patient had a PCN reaction occurring within the last 10 years: NO If all of the above answers are "NO", then may proceed with Cephalosporin use.    Past Medical History:  Diagnosis Date  . Anemia   . Arthritis   . Blood transfusion    1965  . Carotid artery occlusion   . Coronary artery disease Oct. 2011  . Eyesight diminished Change in eyesight  . GERD (gastroesophageal reflux  disease)   . Headache(784.0)   . Hypercholesterolemia   . Hypertension   . Joint pain   . Shortness of breath on exertion     Social History   Socioeconomic History  . Marital status: Married    Spouse name: Not on file  . Number of children: Not on file  . Years of education: Not on file  . Highest education level: Not on file  Occupational History  . Occupation: retired  Engineer, production  . Financial resource strain: Not on file  . Food insecurity:    Worry: Not on file    Inability: Not on file  . Transportation needs:    Medical: Not on file    Non-medical: Not on file  Tobacco Use  . Smoking status: Former Smoker    Years: 25.00    Last attempt to quit: 12/16/2009    Years since quitting: 8.2  . Smokeless tobacco: Never Used  Substance and Sexual Activity  . Alcohol use: Yes    Comment: some  . Drug use: No  . Sexual activity: Not on file  Lifestyle  . Physical activity:    Days per week: Not on file    Minutes per session: Not on file  . Stress: Not on file  Relationships  . Social connections:    Talks on phone: Not on file    Gets together: Not on file    Attends religious service: Not on file    Active member of club or organization: Not on file    Attends meetings of clubs or organizations: Not on file    Relationship status: Not on file  . Intimate partner violence:    Fear of current or ex partner: Not on file    Emotionally abused: Not on file    Physically abused: Not on file    Forced sexual activity: Not on file  Other Topics Concern  . Not on file  Social History Narrative  . Not on file     Family History  Problem Relation Age of Onset  . Diabetes Mother        Late onset  . Hyperlipidemia Mother   . Hypertension Mother   . Heart attack Mother   . Diabetes Sister        Late onset  . Hyperlipidemia Sister   . Hypertension Sister        AAA  . Cancer Brother        Prostate  . Heart disease Brother   . Hypertension Brother   .  Cancer Brother        Prostate  Review of Systems: General: negative for chills, fever, night sweats or weight changes.  Cardiovascular: negative for chest pain, dyspnea on exertion, edema, orthopnea, palpitations, paroxysmal nocturnal dyspnea or shortness of breath Dermatological: negative for rash Respiratory: negative for cough or wheezing Urologic: negative for hematuria Abdominal: negative for nausea, vomiting, diarrhea, bright red blood per rectum, melena, or hematemesis Neurologic: negative for visual changes, syncope, or dizziness All other systems reviewed and are otherwise negative except as noted above.    Blood pressure (!) 158/80, pulse 95, weight 157 lb (71.2 kg), SpO2 95 %.  General appearance: alert, cooperative, no distress and mildly obese Skin: Skin color, texture, turgor normal. No rashes or lesions Neurologic: Grossly normal   ASSESSMENT AND PLAN:   Chest pain Myoview low risk, symptoms improved  Hx of CABG CABG x 4 2011  -Low risk Myoview feb 2018  Essential hypertension Controlled  Stress at home Secondary to husbands illness  PLAN  Same - F/U in 6 months.  Corine ShelterLuke Evgenia Merriman PA-C 03/28/2018 4:06 PM

## 2018-05-15 ENCOUNTER — Encounter (HOSPITAL_COMMUNITY): Payer: Medicare Other

## 2018-05-15 ENCOUNTER — Ambulatory Visit: Payer: Medicare Other | Admitting: Surgery

## 2018-05-30 ENCOUNTER — Other Ambulatory Visit: Payer: Self-pay | Admitting: Cardiovascular Disease

## 2018-05-30 NOTE — Telephone Encounter (Signed)
Amlodipine refilled.

## 2018-07-31 DIAGNOSIS — J0100 Acute maxillary sinusitis, unspecified: Secondary | ICD-10-CM | POA: Diagnosis not present

## 2018-07-31 DIAGNOSIS — H01006 Unspecified blepharitis left eye, unspecified eyelid: Secondary | ICD-10-CM | POA: Diagnosis not present

## 2018-08-08 ENCOUNTER — Other Ambulatory Visit: Payer: Self-pay | Admitting: Surgery

## 2018-08-28 ENCOUNTER — Other Ambulatory Visit: Payer: Self-pay | Admitting: Cardiovascular Disease

## 2018-09-21 DIAGNOSIS — E7800 Pure hypercholesterolemia, unspecified: Secondary | ICD-10-CM | POA: Diagnosis not present

## 2018-09-21 DIAGNOSIS — I10 Essential (primary) hypertension: Secondary | ICD-10-CM | POA: Diagnosis not present

## 2018-09-21 DIAGNOSIS — I2510 Atherosclerotic heart disease of native coronary artery without angina pectoris: Secondary | ICD-10-CM | POA: Diagnosis not present

## 2018-09-21 DIAGNOSIS — Z139 Encounter for screening, unspecified: Secondary | ICD-10-CM | POA: Diagnosis not present

## 2018-09-21 DIAGNOSIS — N183 Chronic kidney disease, stage 3 (moderate): Secondary | ICD-10-CM | POA: Diagnosis not present

## 2018-10-09 ENCOUNTER — Other Ambulatory Visit: Payer: Self-pay

## 2018-10-09 ENCOUNTER — Telehealth: Payer: Self-pay

## 2018-10-09 ENCOUNTER — Telehealth (INDEPENDENT_AMBULATORY_CARE_PROVIDER_SITE_OTHER): Payer: Medicare Other | Admitting: Cardiology

## 2018-10-09 VITALS — BP 134/76 | HR 73 | Ht 63.0 in | Wt 152.0 lb

## 2018-10-09 DIAGNOSIS — Z951 Presence of aortocoronary bypass graft: Secondary | ICD-10-CM

## 2018-10-09 DIAGNOSIS — I10 Essential (primary) hypertension: Secondary | ICD-10-CM

## 2018-10-09 DIAGNOSIS — E7800 Pure hypercholesterolemia, unspecified: Secondary | ICD-10-CM

## 2018-10-09 DIAGNOSIS — I739 Peripheral vascular disease, unspecified: Secondary | ICD-10-CM

## 2018-10-09 NOTE — Telephone Encounter (Signed)
VIRTUAL CONSENT GIVEN TPO Jennifer [name] ON 09/27/2018.      Virtual Visit Pre-Appointment Phone Call  "Jennifer [name], I am calling you today to discuss your upcoming appointment. We are currently trying to limit exposure to the virus that causes COVID-19 by seeing patients at home rather than in the office."  1. "What is the BEST phone number to call the day of the visit?" - include this in appointment notes  2. "Do you have or have access to (through a family member/friend) a smartphone with video capability that we can use for your visit?" a. If yes - list this number in appt notes as "cell" (if different from BEST phone #) and list the appointment type as a VIDEO visit in appointment notes b. If no - list the appointment type as a PHONE visit in appointment notes  3. Confirm consent - "In the setting of the current Covid19 crisis, you are scheduled for a (phone or video) visit with your provider on (date) at (time).  Just as we do with many in-office visits, in order for you to participate in this visit, we must obtain consent.  If you'd like, I can send this to your mychart (if signed up) or email for you to review.  Otherwise, I can obtain your verbal consent now.  All virtual visits are billed to your insurance company just like a normal visit would be.  By agreeing to a virtual visit, we'd like you to understand that the technology does not allow for your provider to perform an examination, and thus may limit your provider's ability to fully assess your condition. If your provider identifies any concerns that need to be evaluated in person, we will make arrangements to do so.  Finally, though the technology is pretty good, we cannot assure that it will always work on either your or our end, and in the setting of a video visit, we may have to convert it to a phone-only visit.  In either situation, we cannot ensure that we have a secure connection.  Are you willing to proceed?" STAFF: Did the  patient verbally acknowledge consent to telehealth visit? Document YES/NO here: YES  4. Advise patient to be prepared - "Two hours prior to your appointment, go ahead and check your blood pressure, pulse, oxygen saturation, and your weight (if you have the equipment to check those) and write them all down. When your visit starts, your provider will ask you for this information. If you have an Apple Watch or Kardia device, please plan to have heart rate information ready on the day of your appointment. Please have a pen and paper handy nearby the day of the visit as well."  5. Give patient instructions for MyChart download to smartphone OR Doximity/Doxy.me as below if video visit (depending on what platform provider is using)  6. Inform patient they will receive a phone call 15 minutes prior to their appointment time (may be from unknown caller ID) so they should be prepared to answer    TELEPHONE CALL NOTE  Jennifer [name] has been deemed a candidate for a follow-up tele-health visit to limit community exposure during the Covid-19 pandemic. I spoke with the patient via phone to ensure availability of phone/video source, confirm preferred email & phone number, and discuss instructions and expectations.  I reminded Jennifer [name] to be prepared with any vital sign and/or heart rhythm information that could potentially be obtained via home monitoring, at the time of her  visit. I reminded Jennifer [name] to expect a phone call prior to her visit.  Harold Hedge, CMA 10/09/2018 8:15 AM   INSTRUCTIONS FOR DOWNLOADING THE MYCHART APP TO SMARTPHONE  - The patient must first make sure to have activated MyChart and know their login information - If Apple, go to [employer] and type in MyChart in the search bar and download the app. If Android, ask patient to go to [employer] and type in [place] in the search bar and download the app. The app is free but as with any other app downloads, their phone may  require them to verify saved payment information or Apple/Android password.  - The patient will need to then log into the app with their MyChart username and password, and select [facility] as their healthcare provider to link the account. When it is time for your visit, go to the MyChart app, find appointments, and click Begin Video Visit. Be sure to Select Allow for your device to access the Microphone and Camera for your visit. You will then be connected, and your provider will be with you shortly.  **If they have any issues connecting, or need assistance please contact MyChart service desk [phone])**  **If using a computer, in order to ensure the best quality for their visit they will need to use either of the following Internet Browsers: [employer], or Google Chrome**  IF USING DOXIMITY or DOXY.ME - The patient will receive a link just prior to their visit by text.     FULL LENGTH CONSENT FOR TELE-HEALTH VISIT   I hereby voluntarily request, consent and authorize [facility] and its employed or contracted physicians, physician assistants, nurse practitioners or other licensed health care professionals (the Practitioner), to provide me with telemedicine health care services (the "Services") as deemed necessary by the treating Practitioner. I acknowledge and consent to receive the Services by the Practitioner via telemedicine. I understand that the telemedicine visit will involve communicating with the Practitioner through live audiovisual communication technology and the disclosure of certain medical information by electronic transmission. I acknowledge that I have been given the opportunity to request an in-person assessment or other available alternative prior to the telemedicine visit and am voluntarily participating in the telemedicine visit.  I understand that I have the right to withhold or withdraw my consent to the use of telemedicine in the course of my care at  any time, without affecting my right to future care or treatment, and that the Practitioner or I may terminate the telemedicine visit at any time. I understand that I have the right to inspect all information obtained and/or recorded in the course of the telemedicine visit and may receive copies of available information for a reasonable fee.  I understand that some of the potential risks of receiving the Services via telemedicine include:  Marland Kitchen Delay or interruption in medical evaluation due to technological equipment failure or disruption; . Information transmitted may not be sufficient (e.g. poor resolution of images) to allow for appropriate medical decision making by the Practitioner; and/or  . In rare instances, security protocols could fail, causing a breach of personal health information.  Furthermore, I acknowledge that it is my responsibility to provide information about my medical history, conditions and care that is complete and accurate to the best of my ability. I acknowledge that Practitioner's advice, recommendations, and/or decision may be based on factors not within their control, such as incomplete or inaccurate data provided by me  or distortions of diagnostic images or specimens that may result from electronic transmissions. I understand that the practice of medicine is not an exact science and that Practitioner makes no warranties or guarantees regarding treatment outcomes. I acknowledge that I will receive a copy of this consent concurrently upon execution via email to the email address I last provided but may also request a printed copy by calling the office of [facility].    I understand that my insurance will be billed for this visit.   I have read or had this consent read to me. . I understand the contents of this consent, which adequately explains the benefits and risks of the Services being provided via telemedicine.  . I have been provided ample opportunity to ask questions  regarding this consent and the Services and have had my questions answered to my satisfaction. . I give my informed consent for the services to be provided through the use of telemedicine in my medical care  By participating in this telemedicine visit I agree to the above.

## 2018-10-09 NOTE — Patient Instructions (Addendum)
Medication Instructions:  Your physician recommends that you continue on your current medications as directed. Please refer to the Current Medication list given to you today. If you need a refill on your cardiac medications before your next appointment, please call your pharmacy.   Lab work: [facility] FROM PCP If you have labs (blood work) drawn today and your tests are completely normal, you will receive your results only by: Marland Kitchen MyChart Message (if you have MyChart) OR . A paper copy in the mail If you have any lab test that is abnormal or we need to change your treatment, we will call you to review the results.  Testing/Procedures: NONE   Follow-Up: At [facility], you and your health needs are our priority.  As part of our continuing mission to provide you with exceptional heart care, we have created designated Provider Care Teams.  These Care Teams include your primary Cardiologist (physician) and Advanced Practice Providers (APPs -  Physician Assistants and Nurse Practitioners) who all work together to provide you with the care you need, when you need it. . Your physician recommends that you schedule a follow-up appointment in: [place], PA-C. PLEASE CONTACT THE OFFICE IN DECEMBER/JANUARY TO SCHEDULE YOUR NEXT APPOINTMENT WITH LUKE KILROY, PA-C.  Any Other Special Instructions Will Be Listed Below (If Applicable).

## 2018-10-09 NOTE — Progress Notes (Signed)
Virtual Visit via Telephone Note   This visit type was conducted due to national recommendations for restrictions regarding the COVID-19 Pandemic (e.g. social distancing) in an effort to limit this patient's exposure and mitigate transmission in our community.  Due to her co-morbid illnesses, this patient is at least at moderate risk for complications without adequate follow up.  This format is felt to be most appropriate for this patient at this time.  The patient did not have access to video technology/had technical difficulties with video requiring transitioning to audio format only (telephone).  All issues noted in this document were discussed and addressed.  No physical exam could be performed with this format.  Please refer to the patient's chart for her  consent to telehealth [facility].   Date:  10/09/2018   ID:  Jennifer [name], DOB 03/23/1940, MRN [id number]  Patient Location: Home Provider Location: Office  PCP:  Lonie Peakonroy, Nathan, PA-C  Cardiologist:  Dr Allyson SabalBerry Electrophysiologist:  None   Evaluation Performed:  Follow-Up Visit  Chief Complaint:  none  History of Present Illness:    Jennifer [name] is a 78 y.o. female with a history of hypertension, CAD, PVD, mild CRI, and dyslipidemia. She had left carotid to subclavian artery bypass grafting by Dr. Myra GianottiBrabham prior to coronary artery bypass grafting x4 in Nov 2011 by Dr Rexanne ManoBrian Bartle.  She then had right carotid endarterectomy performed by Dr. Myra GianottiBrabham March 2013. I saw her in Jan 2020 and she was having some issues with chest pain.  She had been under a great deal of stress, her husband had a stroke (new PAF) and lost peripheral vision. She has had to do the driving and yard mowing.  I added a PPI, low dose nitrate and obtained a Myoview which fortunately was low risk.  She improved and has not had any further symptoms.   The patient does not have symptoms concerning for COVID-19 infection (fever, chills, cough, or new shortness of  breath).    Past Medical History:  Diagnosis Date  . Anemia   . Arthritis   . Blood transfusion    1965  . Carotid artery occlusion   . Coronary artery disease Oct. 2011  . Eyesight diminished Change in eyesight  . GERD (gastroesophageal reflux disease)   . Headache(784.0)   . Hypercholesterolemia   . Hypertension   . Joint pain   . Shortness of breath on exertion    Past Surgical History:  Procedure Laterality Date  . ABDOMINAL HYSTERECTOMY    . BREAST BIOPSY     BILATERAL  BENIGN   . CARDIAC CATHETERIZATION     [facility]  . CARDIOVASCULAR STRESS TEST     [facility]    . CAROTID ARTERY - SUBCLAVIAN ARTERY BYPASS GRAFT    . CESAREAN SECTION     X2   . CORONARY ARTERY BYPASS GRAFT     12/2009, x 4 plus 3 in left subclavian  . ENDARTERECTOMY  05/06/2011   Procedure: ENDARTERECTOMY CAROTID;  Surgeon: Nada LibmanVance W Brabham, MD;  [facility];  Service: Vascular;  Laterality: Right;  right carotid artery endarterctomy with vascu guard patch angioplasty   . EYE SURGERY     LASER BIL X2 FOR GLAUCOMA  . PR VEIN BYPASS GRAFT,AORTO-FEM-POP       Current Meds  Medication Sig  . ALPHA LIPOIC ACID PO Take 250 mg by mouth daily.  Marland Kitchen. amLODipine (NORVASC) 10 MG  tablet TAKE 1 TABLET BY MOUTH DAILY. CALL OFFICE TO SCHEDULE YEARLY APPOINTMENT WITH DR BERRY  . cholecalciferol (VITAMIN D) 1000 UNITS tablet Take 1,000 Units by mouth daily.   . clopidogrel (PLAVIX) 75 MG tablet TAKE 1 TABLET BY MOUTH DAILY.  Marland Kitchen co-enzyme Q-10 50 MG capsule Take 100 mg by mouth daily.  . Flaxseed, Linseed, 1000 MG CAPS Take 1 capsule by mouth daily.   . Garlic (GARLIQUE PO) Take 600 mg by mouth daily.   . Magnesium 250 MG TABS Take 1 tablet by mouth daily.  . metoprolol succinate (TOPROL-XL) 25 MG 24 hr tablet TAKE 1/2 TABLET BY MOUTH ONCE A DAY  . Multiple Vitamin (MULITIVITAMIN WITH MINERALS) TABS Take 1 tablet by mouth daily.  . nitroGLYCERIN (NITROSTAT) 0.4 MG SL tablet Place 1  tablet (0.4 mg total) under the tongue every 5 (five) minutes as needed for chest pain.  . Omega-3 Fatty Acids (FISH OIL) 1000 MG CAPS Take 1,000 mg by mouth daily. Mega [employer] Fish oil  . pantoprazole (PROTONIX) 40 MG tablet Take 1 tablet (40 mg total) by mouth daily.  Vladimir Faster Glycol-Propyl Glycol (SYSTANE OP) Place 1 drop into both eyes daily as needed. For dryt eyes  . rosuvastatin (CRESTOR) 10 MG tablet Take 10 mg by mouth 3 (three) times a week. On Monday, Wednesday, and Friday   . vitamin B-12 (CYANOCOBALAMIN) 500 MCG tablet Take 500 mcg by mouth daily.  . Vitamin Mixture (ESTER-C) 500-60 MG TABS Take 500 mg by mouth daily.     Allergies:   Statins, Imdur [isosorbide nitrate], and Penicillins   Social History   Tobacco Use  . Smoking status: Former Smoker    Years: 25.00    Quit date: 12/16/2009    Years since quitting: 8.8  . Smokeless tobacco: Never Used  Substance Use Topics  . Alcohol use: Yes    Comment: some  . Drug use: No     Family Hx: The patient's family history includes Cancer in her brother and brother; Diabetes in her mother and sister; Heart attack in her mother; Heart disease in her brother; Hyperlipidemia in her mother and sister; Hypertension in her brother, mother, and sister.  ROS:   Please see the history of present illness.    All other systems reviewed and are negative.   Prior CV studies:   The following studies were reviewed today:   Labs/Other Tests and Data Reviewed:    EKG:  No ECG reviewed.  Recent Labs: No results found for requested labs within last 8760 hours.   Recent Lipid Panel Lab Results  Component Value Date/Time   CHOL 164 03/24/2016 05:28 AM   TRIG 62 03/24/2016 05:28 AM   HDL 49 03/24/2016 05:28 AM   CHOLHDL 3.3 03/24/2016 05:28 AM   LDLCALC 103 (H) 03/24/2016 05:28 AM    Wt Readings from Last 3 Encounters:  10/09/18 152 lb (68.9 kg)  03/28/18 157 lb (71.2 kg)  03/03/18 154 lb (69.9 kg)     Objective:     Vital Signs:  BP 134/76   Pulse 73   Ht 5\' 3"  (1.6 m)   Wt 152 lb (68.9 kg)   BMI 26.93 kg/m    VITAL SIGNS:  reviewed  ASSESSMENT & PLAN:    Hx of CABG CABG x 4 2011  -Low risk Myoview Jan 2020  PVD Lt carotid to LSCA bypass prior to CABG 2011, followed by RCE 2013. Last dopplers 2019  Essential hypertension Controlled  Stress  at home Secondary to husbands illness  Dyslipidemia- Followed by PCP- she recently had lipids done and I will see if I can get those results.   COVID-19 Education: The signs and symptoms of COVID-19 were discussed with the patient and how to seek care for testing (follow up with PCP or arrange E-visit).  The importance of social distancing was discussed today.  Time:   Today, I have spent 15 minutes with the patient with telehealth technology discussing the above problems.     Medication Adjustments/Labs and Tests Ordered: Current medicines are reviewed at length with the patient today.  Concerns regarding medicines are outlined above.   Tests Ordered: No orders of the defined types were placed in this encounter.   Medication Changes: No orders of the defined types were placed in this encounter.   Follow Up:  In Person Corine ShelterLuke Zavannah Deblois- 6 months  Signed, Corine ShelterLuke Leonid Manus, Cordelia Poche-C  10/09/2018 10:38 AM    [facility]

## 2018-10-09 NOTE — Telephone Encounter (Signed)
Left detailed message on patients machine with Luke's discharge instructions. Advised the patient to contact the office with any questions or concerns.

## 2018-11-06 ENCOUNTER — Other Ambulatory Visit: Payer: Self-pay | Admitting: Cardiovascular Disease

## 2018-11-23 ENCOUNTER — Other Ambulatory Visit: Payer: Self-pay | Admitting: Cardiovascular Disease

## 2018-11-23 NOTE — Telephone Encounter (Signed)
Rx request sent to pharmacy.  

## 2018-12-11 DIAGNOSIS — N390 Urinary tract infection, site not specified: Secondary | ICD-10-CM | POA: Diagnosis not present

## 2018-12-17 ENCOUNTER — Emergency Department (HOSPITAL_COMMUNITY)
Admission: EM | Admit: 2018-12-17 | Discharge: 2018-12-17 | Disposition: A | Discharge status: Home / Self Care | Payer: Medicare Other | Attending: Emergency Medicine | Admitting: Emergency Medicine

## 2018-12-17 ENCOUNTER — Encounter (HOSPITAL_COMMUNITY): Payer: Self-pay

## 2018-12-17 ENCOUNTER — Other Ambulatory Visit: Payer: Self-pay

## 2018-12-17 ENCOUNTER — Emergency Department (HOSPITAL_COMMUNITY): Payer: Medicare Other

## 2018-12-17 DIAGNOSIS — I10 Essential (primary) hypertension: Secondary | ICD-10-CM | POA: Insufficient documentation

## 2018-12-17 DIAGNOSIS — N201 Calculus of ureter: Secondary | ICD-10-CM | POA: Insufficient documentation

## 2018-12-17 DIAGNOSIS — Z87891 Personal history of nicotine dependence: Secondary | ICD-10-CM | POA: Insufficient documentation

## 2018-12-17 DIAGNOSIS — Z743 Need for continuous supervision: Secondary | ICD-10-CM | POA: Diagnosis not present

## 2018-12-17 DIAGNOSIS — Z951 Presence of aortocoronary bypass graft: Secondary | ICD-10-CM | POA: Insufficient documentation

## 2018-12-17 DIAGNOSIS — M5489 Other dorsalgia: Secondary | ICD-10-CM | POA: Diagnosis not present

## 2018-12-17 DIAGNOSIS — N200 Calculus of kidney: Secondary | ICD-10-CM | POA: Diagnosis not present

## 2018-12-17 DIAGNOSIS — Z79899 Other long term (current) drug therapy: Secondary | ICD-10-CM | POA: Diagnosis not present

## 2018-12-17 DIAGNOSIS — R110 Nausea: Secondary | ICD-10-CM | POA: Diagnosis not present

## 2018-12-17 DIAGNOSIS — R109 Unspecified abdominal pain: Secondary | ICD-10-CM | POA: Diagnosis not present

## 2018-12-17 DIAGNOSIS — R61 Generalized hyperhidrosis: Secondary | ICD-10-CM | POA: Diagnosis not present

## 2018-12-17 DIAGNOSIS — I2510 Atherosclerotic heart disease of native coronary artery without angina pectoris: Secondary | ICD-10-CM | POA: Diagnosis not present

## 2018-12-17 HISTORY — DX: Calculus of kidney: N20.0

## 2018-12-17 LAB — COMPREHENSIVE METABOLIC PANEL
ALT: 18 U/L (ref 0–44)
AST: 26 U/L (ref 15–41)
Albumin: 4.5 g/dL (ref 3.5–5.0)
Alkaline Phosphatase: 64 U/L (ref 38–126)
Anion gap: 11 (ref 5–15)
BUN: 21 mg/dL (ref 8–23)
CO2: 19 mmol/L — ABNORMAL LOW (ref 22–32)
Calcium: 9.2 mg/dL (ref 8.9–10.3)
Chloride: 105 mmol/L (ref 98–111)
Creatinine, Ser: 1.17 mg/dL — ABNORMAL HIGH (ref 0.44–1.00)
GFR calc Af Amer: 52 mL/min — ABNORMAL LOW (ref >60)
GFR calc non Af Amer: 45 mL/min — ABNORMAL LOW (ref >60)
Glucose, Bld: 138 mg/dL — ABNORMAL HIGH (ref 70–99)
Potassium: 4.5 mmol/L (ref 3.5–5.1)
Sodium: 135 mmol/L (ref 135–145)
Total Bilirubin: 0.4 mg/dL (ref 0.3–1.2)
Total Protein: 8.3 g/dL — ABNORMAL HIGH (ref 6.5–8.1)

## 2018-12-17 LAB — CBC WITH DIFFERENTIAL/PLATELET
Abs Immature Granulocytes: 0.03 10*3/uL (ref 0.00–0.07)
Basophils Absolute: 0 10*3/uL (ref 0.0–0.1)
Basophils Relative: 0 %
Eosinophils Absolute: 0 10*3/uL (ref 0.0–0.5)
Eosinophils Relative: 0 %
HCT: 42.2 % (ref 36.0–46.0)
Hemoglobin: 13.5 g/dL (ref 12.0–15.0)
Immature Granulocytes: 0 %
Lymphocytes Relative: 7 %
Lymphs Abs: 0.8 10*3/uL (ref 0.7–4.0)
MCH: 31.2 pg (ref 26.0–34.0)
MCHC: 32 g/dL (ref 30.0–36.0)
MCV: 97.5 fL (ref 80.0–100.0)
Monocytes Absolute: 0.3 10*3/uL (ref 0.1–1.0)
Monocytes Relative: 3 %
Neutro Abs: 9.8 10*3/uL — ABNORMAL HIGH (ref 1.7–7.7)
Neutrophils Relative %: 90 %
Platelets: 242 10*3/uL (ref 150–400)
RBC: 4.33 MIL/uL (ref 3.87–5.11)
RDW: 12.9 % (ref 11.5–15.5)
WBC: 11.1 10*3/uL — ABNORMAL HIGH (ref 4.0–10.5)
nRBC: 0 % (ref 0.0–0.2)

## 2018-12-17 LAB — URINALYSIS, ROUTINE W REFLEX MICROSCOPIC
Bacteria, UA: NONE SEEN
Bilirubin Urine: NEGATIVE
Glucose, UA: NEGATIVE mg/dL
Ketones, ur: 20 mg/dL — AB
Leukocytes,Ua: NEGATIVE
Nitrite: NEGATIVE
Protein, ur: 30 mg/dL — AB
Specific Gravity, Urine: 1.018 (ref 1.005–1.030)
pH: 5 (ref 5.0–8.0)

## 2018-12-17 LAB — LIPASE, BLOOD: Lipase: 35 U/L (ref 11–51)

## 2018-12-17 MED ORDER — MORPHINE SULFATE (PF) 4 MG/ML IV SOLN
4.0000 mg | Freq: Once | INTRAVENOUS | Status: AC
  Administered 2018-12-17: 4 mg via INTRAMUSCULAR
  Filled 2018-12-17: qty 1

## 2018-12-17 MED ORDER — OXYCODONE-ACETAMINOPHEN 5-325 MG PO TABS: 1.0000 | ORAL_TABLET | Freq: Four times a day (QID) | ORAL | 0 refills | Status: DC | PRN

## 2018-12-17 MED ORDER — ONDANSETRON HCL 4 MG/2ML IJ SOLN
4.0000 mg | Freq: Once | INTRAMUSCULAR | Status: AC
  Administered 2018-12-17: 15:00:00 4 mg via INTRAVENOUS
  Filled 2018-12-17: qty 2

## 2018-12-17 MED ORDER — FENTANYL CITRATE (PF) 100 MCG/2ML IJ SOLN
50.0000 ug | Freq: Once | INTRAMUSCULAR | Status: AC
  Administered 2018-12-17: 15:00:00 50 ug via INTRAVENOUS
  Filled 2018-12-17: qty 2

## 2018-12-17 MED ORDER — ONDANSETRON HCL 4 MG PO TABS: 4.0000 mg | ORAL_TABLET | Freq: Four times a day (QID) | ORAL | 0 refills | Status: DC

## 2018-12-17 MED ORDER — SODIUM CHLORIDE 0.9 % IV BOLUS
1000.0000 mL | Freq: Once | INTRAVENOUS | Status: AC
  Administered 2018-12-17: 1000 mL via INTRAVENOUS

## 2018-12-17 NOTE — ED Notes (Signed)
Pt requesting to get up and walk around to see if that helps her ability to urinate on her own. Pt was assisted with standing from her bed and putting her shoes on before walking to the restroom. Pt is going to attempt to urinate some so she may go home.

## 2018-12-17 NOTE — ED Triage Notes (Signed)
She reports voiding at about midnight, at which time she felt as if she hadn't achieved complete bladder emptying. She then went to bed and slept reasonably well until ~0330 at which time she experienced right flank pain which persists. She also tells [place] she has been unable to void at all after 0030 hours today. She rec'd. A total of 4 mg of IV Zofran and 195mcg of IV Fentanyl en route.

## 2018-12-17 NOTE — ED Notes (Addendum)
Pt was able to urinate and passed one kidney stone. RN and PA notified of same. Pt walked back to her room and was seated on the bed. Pt advised she feels better.

## 2018-12-17 NOTE — Discharge Instructions (Addendum)
Take 1 Percocet every 6 hours as needed for severe pain.  Take Zofran every 6 hours as needed for nausea or vomiting.  Make sure to stay well-hydrated.  Please follow-up with urologist within 1 week.  Please return to the emergency department if you develop any severe, worsening pain unresolved by medication, intractable vomiting, fever, or any other concerning symptoms.  Your CT showed an abdominal aortic aneurysm.  This will need to be monitored by your doctor with ultrasound every 3 years.  Your CT also showed pulmonary fibrosis, however you and your doctor may be already aware of this. If not, please discuss this with them.  Your CT specifically showed: Mild-to-moderate right hydroureteronephrosis due to two 4 mm  ureteral calculi at the right UPJ and right UVJ.   3.4 cm infrarenal abdominal aortic aneurysm. Recommend followup by  ultrasound in 3 years.  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   Do not drink alcohol, drive, operate machinery or participate in any other potentially dangerous activities while taking opiate pain medication as it may make you sleepy. Do not take this medication with any other sedating medications, either prescription or over-the-counter. If you were prescribed Percocet or Vicodin, do not take these with acetaminophen (Tylenol) as it is already contained within these medications and overdose of Tylenol is dangerous.   This medication is an opiate (or narcotic) pain medication and can be habit forming.  Use it as little as possible to achieve adequate pain control.  Do not use or use it with extreme caution if you have a history of opiate abuse or dependence. This medication is intended for your use only - do not give any to anyone else and keep it in a secure place where nobody else, especially children, have access to it. It will also cause or worsen constipation, so you may want to consider taking an over-the-counter stool softener  while you are taking this medication.

## 2018-12-17 NOTE — ED Notes (Signed)
Patient transported to CT 

## 2018-12-17 NOTE — ED Provider Notes (Signed)
[facility]-EMERGENCY DEPT Provider Note   CSN: [id number] Arrival date & time: 12/17/18  1107     History   Chief Complaint Chief Complaint  Patient presents with   Flank Pain    HPI Jennifer [name] is a 78 y.o. female with history of hypertension, hypercholesterolemia, GERD, CAD, kidney stones presents with a 3-day history of right flank pain that is now moving to her right lower abdomen.  She reports difficulty urinating and dysuria.  She reports she has not been able to urinate much today at all.  She has had associated nausea and vomiting.  She reports it feels similar to previous kidney stone.  She denies any fevers, chest pain, shortness of breath.     HPI  Past Medical History:  Diagnosis Date   Anemia    Arthritis    Blood transfusion    1965   Carotid artery occlusion    Coronary artery disease Oct. 2011   Eyesight diminished Change in eyesight   GERD (gastroesophageal reflux disease)    Headache(784.0)    Hypercholesterolemia    Hypertension    Joint pain    Kidney stone    Shortness of breath on exertion     Patient Active Problem List   Diagnosis Date Noted   Stress at home 03/02/2018   Diplopia 03/24/2016   Chest pain 03/23/2016   PVD (peripheral vascular disease) (HCC) 07/10/2013   Aftercare following surgery of the circulatory system, NEC 12/04/2012   Hx of CABG 11/13/2012   Essential hypertension 11/13/2012   Hyperlipidemia 11/13/2012   History of tobacco abuse 11/13/2012   Carotid artery stenosis and occlusion 05/31/2011   Occlusion and stenosis of carotid artery 04/12/2011    Past Surgical History:  Procedure Laterality Date   ABDOMINAL HYSTERECTOMY     BREAST BIOPSY     BILATERAL  BENIGN    CARDIAC CATHETERIZATION     [facility]   CARDIOVASCULAR STRESS TEST     2012 DR KADAKIA ALSO ECHO     CAROTID ARTERY - SUBCLAVIAN ARTERY BYPASS GRAFT     CESAREAN SECTION     X2     CORONARY ARTERY BYPASS GRAFT     12/2009, x 4 plus 3 in left subclavian   ENDARTERECTOMY  05/06/2011   Procedure: ENDARTERECTOMY CAROTID;  Surgeon: Nada LibmanVance W Brabham, MD;  Location: [facility] OR;  Service: Vascular;  Laterality: Right;  right carotid artery endarterctomy with vascu guard patch angioplasty    EYE SURGERY     LASER BIL X2 FOR GLAUCOMA   PR VEIN BYPASS GRAFT,AORTO-FEM-POP       OB History   No obstetric history on file.      Home Medications    Prior to Admission medications   Medication Sig Start Date End Date Taking? Authorizing Provider  ALPHA LIPOIC ACID PO Take 250 mg by mouth daily.   Yes [provider]  amLODipine (NORVASC) 10 MG tablet Take 1 tablet (10 mg total) by mouth daily. 11/23/18  Yes Runell GessBerry, Jonathan J, MD  cholecalciferol (VITAMIN D) 1000 UNITS tablet Take 1,000 Units by mouth daily.    Yes [provider]  CINNAMON PO Take 1 capsule by mouth daily.   Yes [provider]  clopidogrel (PLAVIX) 75 MG tablet TAKE 1 TABLET BY MOUTH DAILY. 08/08/18  Yes Nada LibmanBrabham, Vance W, MD  co-enzyme Q-10 50 MG capsule Take 100 mg by mouth daily.   Yes [provider]  Flaxseed, Linseed, 1000 MG CAPS Take 1 capsule by mouth daily.    Yes [provider]  Garlic (GARLIQUE PO) Take 600 mg by mouth daily.    Yes [provider]  Magnesium 250 MG TABS Take 1 tablet by mouth daily.   Yes [provider]  metoprolol succinate (TOPROL-XL) 25 MG 24 hr tablet TAKE 1/2 TABLET BY MOUTH ONCE A DAY 11/06/18  Yes Runell Gess, MD  Multiple Vitamin (MULITIVITAMIN WITH MINERALS) TABS Take 1 tablet by mouth daily.   Yes [provider]  Polyethyl Glycol-Propyl Glycol (SYSTANE OP) Place 1 drop into both eyes daily as needed. For dryt eyes   Yes [provider]  rosuvastatin (CRESTOR) 10 MG tablet Take 10 mg by mouth 3 (three) times a week. On Monday, Wednesday, and Friday    Yes [provider]    sulfamethoxazole-trimethoprim (BACTRIM DS) 800-160 MG tablet Take 1 tablet by mouth 2 (two) times daily. 5 day supply 12/11/18  Yes [provider]  vitamin B-12 (CYANOCOBALAMIN) 500 MCG tablet Take 500 mcg by mouth daily.   Yes [provider]  nitroGLYCERIN (NITROSTAT) 0.4 MG SL tablet Place 1 tablet (0.4 mg total) under the tongue every 5 (five) minutes as needed for chest pain. 03/02/18 10/09/18  Abelino Derrick, PA-C  ondansetron (ZOFRAN) 4 MG tablet Take 1 tablet (4 mg total) by mouth every 6 (six) hours. 12/17/18   Dorine Duffey, Waylan Boga, PA-C  oxyCODONE-acetaminophen (PERCOCET/ROXICET) 5-325 MG tablet Take 1 tablet by mouth every 6 (six) hours as needed for severe pain. 12/17/18   Agustina Witzke, Waylan Boga, PA-C  pantoprazole (PROTONIX) 40 MG tablet Take 1 tablet (40 mg total) by mouth daily. Patient not taking: Reported on 12/17/2018 03/02/18   Abelino Derrick, PA-C  Vitamin Mixture (ESTER-C) 500-60 MG TABS Take 500 mg by mouth daily.    [provider]    Family History Family History  Problem Relation Age of Onset   Diabetes Mother        Late onset   Hyperlipidemia Mother    Hypertension Mother    Heart attack Mother    Diabetes Sister        Late onset   Hyperlipidemia Sister    Hypertension Sister        AAA   Cancer Brother        Prostate   Heart disease Brother    Hypertension Brother    Cancer Brother        Prostate     Social History Social History   Tobacco Use   Smoking status: Former Smoker    Years: 25.00    Quit date: 12/16/2009    Years since quitting: 9.0   Smokeless tobacco: Never Used  Substance Use Topics   Alcohol use: Yes    Comment: some   Drug use: No     Allergies   Statins, Imdur [isosorbide nitrate], and Penicillins   Review of Systems Review of Systems  Constitutional: Negative for chills and fever.  HENT: Negative for facial swelling and sore throat.   Respiratory: Negative for shortness of breath.    Cardiovascular: Negative for chest pain.  Gastrointestinal: Positive for abdominal pain, nausea and vomiting.  Genitourinary: Positive for flank pain. Negative for dysuria.  Musculoskeletal: Negative for back pain.  Skin: Negative for rash and wound.  Neurological: Negative for headaches.  Psychiatric/Behavioral: The patient is not nervous/anxious.      Physical Exam Updated Vital Signs BP 133/84 (  BP Location: Left Arm)    Pulse 99    Temp (!) 97.5 F (36.4 C) (Oral)    Resp 20    SpO2 96%   Physical Exam Vitals signs and nursing note reviewed.  Constitutional:      General: She is not in acute distress.    Appearance: She is well-developed. She is not diaphoretic.  HENT:     Head: Normocephalic and atraumatic.     Mouth/Throat:     Pharynx: No oropharyngeal exudate.  Eyes:     General: No scleral icterus.       Right eye: No discharge.        Left eye: No discharge.     Conjunctiva/sclera: Conjunctivae normal.     Pupils: Pupils are equal, round, and reactive to light.  Neck:     Musculoskeletal: Normal range of motion and neck supple.     Thyroid: No thyromegaly.  Cardiovascular:     Rate and Rhythm: Normal rate and regular rhythm.     Heart sounds: Normal heart sounds. No murmur. No friction rub. No gallop.   Pulmonary:     Effort: Pulmonary effort is normal. No respiratory distress.     Breath sounds: Normal breath sounds. No stridor. No wheezing or rales.  Abdominal:     General: Bowel sounds are normal. There is no distension.     Palpations: Abdomen is soft.     Tenderness: There is abdominal tenderness in the right lower quadrant. There is no right CVA tenderness, left CVA tenderness, guarding or rebound. Negative signs include McBurney's sign.    Lymphadenopathy:     Cervical: No cervical adenopathy.  Skin:    General: Skin is warm and dry.     Coloration: Skin is not pale.     Findings: No rash.  Neurological:     Mental Status: She is alert.      Coordination: Coordination normal.      ED Treatments / Results  Labs (all labs ordered are listed, but only abnormal results are displayed) Labs Reviewed  COMPREHENSIVE METABOLIC PANEL - Abnormal; Notable for the following components:      Result Value   CO2 19 (*)    Glucose, Bld 138 (*)    Creatinine, Ser 1.17 (*)    Total Protein 8.3 (*)    GFR calc non Af Amer 45 (*)    GFR calc Af Amer 52 (*)    All other components within normal limits  CBC WITH DIFFERENTIAL/PLATELET - Abnormal; Notable for the following components:   WBC 11.1 (*)    Neutro Abs 9.8 (*)    All other components within normal limits  URINALYSIS, ROUTINE W REFLEX MICROSCOPIC - Abnormal; Notable for the following components:   APPearance HAZY (*)    Hgb urine dipstick LARGE (*)    Ketones, ur 20 (*)    Protein, ur 30 (*)    All other components within normal limits  LIPASE, BLOOD    EKG None  Radiology Ct Renal Stone Study  Result Date: 12/17/2018 CLINICAL DATA:  Right flank pain and right lower quadrant pain. Nephrolithiasis. EXAM: CT ABDOMEN AND PELVIS WITHOUT CONTRAST TECHNIQUE: Multidetector CT imaging of the abdomen and pelvis was performed following the standard protocol without IV contrast. COMPARISON:  None. FINDINGS: Lower chest: No acute findings. Bibasilar interstitial fibrosis and traction bronchiectasis. Hepatobiliary: No mass visualized on this unenhanced exam. Tiny calcified gallstones are seen, however there is no evidence of cholecystitis or biliary  ductal dilatation. Pancreas: No mass or inflammatory process visualized on this unenhanced exam. Spleen:  Within normal limits in size. Adrenals/Urinary tract: Bilateral renal vascular calcification noted. Mild-to-moderate right hydroureteronephrosis is seen. A 4 mm calculus is seen approximately at the right UPJ, and a 2nd 4 mm calculus is seen distally at the right UVJ. Stomach/Bowel: No evidence of obstruction, inflammatory process, or abnormal  fluid collections. Vascular/Lymphatic: No pathologically enlarged lymph nodes identified. A 3.4 cm infrarenal abdominal aortic aneurysm is new since previous study. No evidence of aneurysm leak or rupture. Reproductive: Prior hysterectomy noted. Adnexal regions are unremarkable in appearance. Other:  None. Musculoskeletal:  No suspicious bone lesions identified. IMPRESSION: Mild-to-moderate right hydroureteronephrosis due to two 4 mm ureteral calculi at the right UPJ and right UVJ. 3.4 cm infrarenal abdominal aortic aneurysm. Recommend followup by ultrasound in 3 years. This recommendation follows ACR consensus guidelines: White Paper of the ACR Incidental Findings Committee II on Vascular Findings. J Am Coll Radiol 2013; 91:478-295 Cholelithiasis. No radiographic evidence of cholecystitis. 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 Electronically Signed   By: Danae Orleans M.D.   On: 12/17/2018 13:35    Procedures Procedures (including critical care time)  Medications Ordered in ED Medications  morphine 4 MG/ML injection 4 mg (4 mg Intramuscular Given 12/17/18 1317)  sodium chloride 0.9 % bolus 1,000 mL (0 mLs Intravenous Stopped 12/17/18 1637)  ondansetron (ZOFRAN) injection 4 mg (4 mg Intravenous Given 12/17/18 1441)  fentaNYL (SUBLIMAZE) injection 50 mcg (50 mcg Intravenous Given 12/17/18 1454)     Initial Impression / Assessment and Plan / ED Course  I have reviewed the triage vital signs and the nursing notes.  Pertinent labs & imaging results that were available during my care of the patient were reviewed by me and considered in my medical decision making (see chart for details).        Patient with right flank pain.  She is found to have 2, 4 mm stones in her right ureter.  One at the UVJ and one at the UPJ.  Patient with very mild elevation in creatinine.  IV fluids given.  Initial urine was obtained by in and out cath as patient states she felt she could not  urinate.  However after IV fluids, patient is able to urinate on her own and actually passed one of the stones.  Patient will be discharged home with Percocet and Zofran with follow-up to urology.  Patient found to have an infrarenal abdominal aneurysm measuring 3.4 cm, recommend follow-up in 3 years with ultrasound.  Patient made aware of this.  Patient also made aware of pulmonary fibrosis seen on CT.  Follow-up with PCP for these.  Return precautions discussed.  Patient understands and agrees with plan.  Patient vitals stable throughout ED course and discharged in satisfactory condition.  Final Clinical Impressions(s) / ED Diagnoses   Final diagnoses:  Ureterolithiasis    ED Discharge Orders         Ordered    oxyCODONE-acetaminophen (PERCOCET/ROXICET) 5-325 MG tablet  Every 6 hours PRN     12/17/18 1545    ondansetron (ZOFRAN) 4 MG tablet  Every 6 hours     12/17/18 1545           Allsion Nogales, [place], PA-C 12/17/18 1638    Lorre Nick, MD 12/18/18 0830

## 2019-02-05 ENCOUNTER — Other Ambulatory Visit: Payer: Self-pay | Admitting: Surgery

## 2019-02-21 ENCOUNTER — Other Ambulatory Visit: Payer: Self-pay | Admitting: Cardiovascular Disease

## 2019-03-07 ENCOUNTER — Encounter: Payer: Self-pay | Admitting: Cardiology

## 2019-03-07 ENCOUNTER — Telehealth (INDEPENDENT_AMBULATORY_CARE_PROVIDER_SITE_OTHER): Payer: Medicare Other | Admitting: Cardiology

## 2019-03-07 VITALS — BP 134/83 | HR 77 | Ht 63.0 in | Wt 153.0 lb

## 2019-03-07 DIAGNOSIS — I739 Peripheral vascular disease, unspecified: Secondary | ICD-10-CM

## 2019-03-07 DIAGNOSIS — I10 Essential (primary) hypertension: Secondary | ICD-10-CM | POA: Diagnosis not present

## 2019-03-07 DIAGNOSIS — E7800 Pure hypercholesterolemia, unspecified: Secondary | ICD-10-CM

## 2019-03-07 DIAGNOSIS — Z951 Presence of aortocoronary bypass graft: Secondary | ICD-10-CM | POA: Diagnosis not present

## 2019-03-07 NOTE — Patient Instructions (Addendum)
Medication Instructions:  Your physician recommends that you continue on your current medications as directed. Please refer to the Current Medication list given to you today.  *If you need a refill on your cardiac medications before your next appointment, please call your pharmacy*  Lab Work: NONE ORDERED  TODAY   If you have labs (blood work) drawn today and your tests are completely normal, you will receive your results only by: Marland Kitchen MyChart Message (if you have MyChart) OR . A paper copy in the mail If you have any lab test that is abnormal or we need to change your treatment, we will call you to review the results.  Testing/Procedures: NONE ORDERED  TODAY Follow-Up: At [facility], you and your health needs are our priority.  As part of our continuing mission to provide you with exceptional heart care, we have created designated Provider Care Teams.  These Care Teams include your primary Cardiologist (physician) and Advanced Practice Providers (APPs -  Physician Assistants and Nurse Practitioners) who all work together to provide you with the care you need, when you need it.  Your next appointment:   6 month(s)  The format for your next appointment:   In Person  Provider:  Corine Shelter PA-C   Other Instructions

## 2019-03-07 NOTE — Progress Notes (Signed)
Virtual Visit via Telephone Note   This visit type was conducted due to national recommendations for restrictions regarding the COVID-19 Pandemic (e.g. social distancing) in an effort to limit this patient's exposure and mitigate transmission in our community.  Due to her co-morbid illnesses, this patient is at least at moderate risk for complications without adequate follow up.  This format is felt to be most appropriate for this patient at this time.  The patient did not have access to video technology/had technical difficulties with video requiring transitioning to audio format only (telephone).  All issues noted in this document were discussed and addressed.  No physical exam could be performed with this format.  Please refer to the patient's chart for her  consent to telehealth for [facility].   Date:  03/07/2019   ID:  Jennifer [name], DOB 12/14/1940, MRN [id number]  Patient Location: Home Provider Location: Home  PCP:  Lonie Peak, PA-C  Cardiologist:  Dr Allyson Sabal Electrophysiologist:  None   Evaluation Performed:  Follow-Up Visit  Chief Complaint:  none  History of Present Illness:    Jennifer [name] is a pleasant 79 y.o. female with a history of hypertension, CAD, PVD, mild CRI, and dyslipidemia. She had left carotid to subclavian artery bypass grafting by Dr. Myra Gianotti prior to coronary artery bypass grafting x4 in Nov 2011 by Dr Rexanne Mano. She then had right carotid endarterectomy performed by Dr. Myra Gianotti March 2013. I saw her in Jan 2020 and she was having some issues with chest pain.  She had been under a great deal of stress, her husband had a stroke (new PAF) and lost peripheral vision. She has had to do the driving and yard mowing.  I added a PPI, low dose nitrate and obtained a Myoview which fortunately was low risk and her symptoms resolved.   I contacted her virtually in August and she was doing well.  In November she was seen in the ED for nephrolithiasis and "passed a  stone".  She has had no further issues from that standpoint.  She tells me she has had no complaints of chest pain or unusual dyspnea. She is tolerating her medications well and has been very careful about COVID.   The patient does not have symptoms concerning for COVID-19 infection (fever, chills, cough, or new shortness of breath).    Past Medical History:  Diagnosis Date  . Anemia   . Arthritis   . Blood transfusion    1965  . Carotid artery occlusion   . Coronary artery disease Oct. 2011  . Eyesight diminished Change in eyesight  . GERD (gastroesophageal reflux disease)   . Headache(784.0)   . Hypercholesterolemia   . Hypertension   . Joint pain   . Kidney stone   . Shortness of breath on exertion    Past Surgical History:  Procedure Laterality Date  . ABDOMINAL HYSTERECTOMY    . BREAST BIOPSY     BILATERAL  BENIGN   . CARDIAC CATHETERIZATION     2011  DR Algie Coffer AT [facility]  . CARDIOVASCULAR STRESS TEST     2012 DR [facility] ALSO ECHO    . CAROTID ARTERY - SUBCLAVIAN ARTERY BYPASS GRAFT    . CESAREAN SECTION     X2   . CORONARY ARTERY BYPASS GRAFT     12/2009, x 4 plus 3 in left subclavian  . ENDARTERECTOMY  05/06/2011   Procedure: ENDARTERECTOMY CAROTID;  Surgeon: Nada Libman, MD;  Location: [facility] OR;  Service: Vascular;  Laterality: Right;  right carotid artery endarterctomy with vascu guard patch angioplasty   . EYE SURGERY     LASER BIL X2 FOR GLAUCOMA  . PR VEIN BYPASS GRAFT,AORTO-FEM-POP       Current Meds  Medication Sig  . ALPHA LIPOIC ACID PO Take 250 mg by mouth daily.  Marland Kitchen amLODipine (NORVASC) 10 MG tablet TAKE 1 TABLET (10 MG TOTAL) BY MOUTH DAILY.  . cholecalciferol (VITAMIN D) 1000 UNITS tablet Take 1,000 Units by mouth daily.   Marland Kitchen CINNAMON PO Take 1 capsule by mouth daily.  . clopidogrel (PLAVIX) 75 MG tablet TAKE 1 TABLET BY MOUTH DAILY.  Marland Kitchen co-enzyme Q-10 50 MG capsule Take 100 mg by mouth daily.  . Flaxseed, Linseed, 1000 MG CAPS Take 1 capsule by mouth  daily.   . Garlic (GARLIQUE PO) Take 600 mg by mouth daily.   . Magnesium 250 MG TABS Take 1 tablet by mouth daily.  . metoprolol succinate (TOPROL-XL) 25 MG 24 hr tablet TAKE 1/2 TABLET BY MOUTH ONCE A DAY  . Multiple Vitamin (MULITIVITAMIN WITH MINERALS) TABS Take 1 tablet by mouth daily.  . ondansetron (ZOFRAN) 4 MG tablet Take 1 tablet (4 mg total) by mouth every 6 (six) hours.  Marland Kitchen oxyCODONE-acetaminophen (PERCOCET/ROXICET) 5-325 MG tablet Take 1 tablet by mouth every 6 (six) hours as needed for severe pain.  . pantoprazole (PROTONIX) 40 MG tablet Take 1 tablet (40 mg total) by mouth daily.  Bertram Gala Glycol-Propyl Glycol (SYSTANE OP) Place 1 drop into both eyes daily as needed. For dryt eyes  . rosuvastatin (CRESTOR) 10 MG tablet Take 10 mg by mouth 3 (three) times a week. On Monday, Wednesday, and Friday   . sulfamethoxazole-trimethoprim (BACTRIM DS) 800-160 MG tablet Take 1 tablet by mouth 2 (two) times daily. 5 day supply  . vitamin B-12 (CYANOCOBALAMIN) 500 MCG tablet Take 500 mcg by mouth daily.  . Vitamin Mixture (ESTER-C) 500-60 MG TABS Take 500 mg by mouth daily.     Allergies:   Statins, Imdur [isosorbide nitrate], and Penicillins   Social History   Tobacco Use  . Smoking status: Former Smoker    Years: 25.00    Quit date: 12/16/2009    Years since quitting: 9.2  . Smokeless tobacco: Never Used  Substance Use Topics  . Alcohol use: Yes    Comment: some  . Drug use: No     Family Hx: The patient's family history includes Cancer in her brother and brother; Diabetes in her mother and sister; Heart attack in her mother; Heart disease in her brother; Hyperlipidemia in her mother and sister; Hypertension in her brother, mother, and sister.  ROS:   Please see the history of present illness.    All other systems reviewed and are negative.   Prior CV studies:   The following studies were reviewed today: Myoview 03/03/2018  Labs/Other Tests and Data Reviewed:    EKG:   An ECG dated 03/01/2018 was personally reviewed today and demonstrated:  NSR, HR 88  Recent Labs: 12/17/2018: ALT 18; BUN 21; Creatinine, Ser 1.17; Hemoglobin 13.5; Platelets 242; Potassium 4.5; Sodium 135   Recent Lipid Panel Lab Results  Component Value Date/Time   CHOL 164 03/24/2016 05:28 AM   TRIG 62 03/24/2016 05:28 AM   HDL 49 03/24/2016 05:28 AM   CHOLHDL 3.3 03/24/2016 05:28 AM   LDLCALC 103 (H) 03/24/2016 05:28 AM    Wt Readings from Last 3 Encounters:  03/07/19 153 lb (69.4  kg)  10/09/18 152 lb (68.9 kg)  03/28/18 157 lb (71.2 kg)     Objective:    Vital Signs:  BP 134/83   Pulse 77   Ht 5\' 3"  (1.6 m)   Wt 153 lb (69.4 kg)   BMI 27.10 kg/m    VITAL SIGNS:  reviewed  ASSESSMENT & PLAN:    Hx of CABG CABG x 4 2011-Low risk Myoview Jan 2020- no symptoms to suggest angina  PVD Lt carotid to LSCA bypass prior to CABG 2011, followed by RCE 2013. Last dopplers 2019  Essential hypertension Controlled  Dyslipidemia- Followed by PCP- LDL in August 2020 was 114 on Crestor 10 mg 3 x week.  I offered her Zetia but she declined.   COVID-19 Education: The signs and symptoms of COVID-19 were discussed with the patient and how to seek care for testing (follow up with PCP or arrange E-visit).  The importance of social distancing was discussed today.  Time:   Today, I have spent 10 minutes with the patient with telehealth technology discussing the above problems.     Medication Adjustments/Labs and Tests Ordered: Current medicines are reviewed at length with the patient today.  Concerns regarding medicines are outlined above.   Tests Ordered: No orders of the defined types were placed in this encounter.   Medication Changes: No orders of the defined types were placed in this encounter.   Follow Up:  In Person In person with me in June  Signed, Brittiany Wiehe, [place]  03/07/2019 10:09 AM    [place]

## 2019-03-29 DIAGNOSIS — E7800 Pure hypercholesterolemia, unspecified: Secondary | ICD-10-CM | POA: Diagnosis not present

## 2019-03-29 DIAGNOSIS — I2510 Atherosclerotic heart disease of native coronary artery without angina pectoris: Secondary | ICD-10-CM | POA: Diagnosis not present

## 2019-03-29 DIAGNOSIS — N183 Chronic kidney disease, stage 3 unspecified: Secondary | ICD-10-CM | POA: Diagnosis not present

## 2019-03-29 DIAGNOSIS — Z9181 History of falling: Secondary | ICD-10-CM | POA: Diagnosis not present

## 2019-03-29 DIAGNOSIS — I10 Essential (primary) hypertension: Secondary | ICD-10-CM | POA: Diagnosis not present

## 2019-03-29 DIAGNOSIS — I6523 Occlusion and stenosis of bilateral carotid arteries: Secondary | ICD-10-CM | POA: Diagnosis not present

## 2019-03-30 ENCOUNTER — Ambulatory Visit: Payer: Medicare Other | Admitting: Cardiology

## 2019-04-02 ENCOUNTER — Ambulatory Visit: Payer: Medicare Other | Admitting: Surgery

## 2019-04-02 ENCOUNTER — Encounter (HOSPITAL_COMMUNITY): Payer: Medicare Other

## 2019-04-30 ENCOUNTER — Ambulatory Visit (HOSPITAL_COMMUNITY)
Admission: RE | Admit: 2019-04-30 | Discharge: 2019-04-30 | Disposition: A | Discharge status: Home / Self Care | Payer: Medicare Other | Source: Ambulatory Visit | Attending: Surgery | Admitting: Surgery

## 2019-04-30 ENCOUNTER — Other Ambulatory Visit: Payer: Self-pay

## 2019-04-30 ENCOUNTER — Encounter: Payer: Self-pay | Admitting: Surgery

## 2019-04-30 ENCOUNTER — Ambulatory Visit: Payer: Medicare Other | Admitting: Surgery

## 2019-04-30 VITALS — BP 156/88 | HR 81 | Temp 97.7°F | Resp 18 | Ht 63.0 in | Wt 157.9 lb

## 2019-04-30 DIAGNOSIS — I739 Peripheral vascular disease, unspecified: Secondary | ICD-10-CM | POA: Insufficient documentation

## 2019-04-30 DIAGNOSIS — I6523 Occlusion and stenosis of bilateral carotid arteries: Secondary | ICD-10-CM

## 2019-04-30 NOTE — Progress Notes (Signed)
[facility] [place]  Patient name: Jennifer [name] MRN: [id number] DOB: [birth year] Sex: female   REASON FOR VISIT:    Follow up  HISOTRY OF PRESENT ILLNESS:   Jennifer [name] a 79 y.o.femalereturns today for follow-up. In November 2011 she underwent aortic arch reconstruction with bypass graft to the innominate, left carotid, and left subclavian artery. This was done simultaneously with CABG. She has also undergone a right carotid endarterectomy in March 2013 for asymptomatic stenosis.  She continues to take a statin 3 times a week.  Her most recent LDL cholesterol was 103.  She is medically managed for hypertension.  She has not been having any episodes of chest pain.  PAST MEDICAL HISTORY:   Past Medical History:  Diagnosis Date  . Anemia   . Arthritis   . Blood transfusion    1965  . Carotid artery occlusion   . Coronary artery disease Oct. 2011  . Eyesight diminished Change in eyesight  . GERD (gastroesophageal reflux disease)   . Headache(784.0)   . Hypercholesterolemia   . Hypertension   . Joint pain   . Kidney stone   . Shortness of breath on exertion      FAMILY HISTORY:   Family History  Problem Relation Age of Onset  . Diabetes Mother        Late onset  . Hyperlipidemia Mother   . Hypertension Mother   . Heart attack Mother   . Diabetes Sister        Late onset  . Hyperlipidemia Sister   . Hypertension Sister        AAA  . Cancer Brother        Prostate  . Heart disease Brother   . Hypertension Brother   . Cancer Brother        Prostate     SOCIAL HISTORY:   Social History   Tobacco Use  . Smoking status: Former Smoker    Years: 25.00    Quit date: 12/16/2009    Years since quitting: 9.3  . Smokeless tobacco: Never Used  Substance Use Topics  . Alcohol use: Yes    Comment: some     ALLERGIES:   Allergies  Allergen Reactions  . Statins Anaphylaxis    High dose ( Crestor  20 mg , pt cannot take)  . Imdur [Isosorbide Nitrate] Other (See Comments)    Headache  . Penicillins Hives and Rash    Has patient had a PCN reaction causing immediate rash, facial/tongue/throat swelling, SOB or lightheadedness with hypotension:YES Has patient had a PCN reaction causing severe rash involving mucus membranes or skin necrosis: NO Has patient had a PCN reaction that required hospitalization NO Has patient had a PCN reaction occurring within the last 10 years: NO If all of the above answers are "NO", then may proceed with Cephalosporin use.     CURRENT MEDICATIONS:   Current Outpatient Medications  Medication Sig Dispense Refill  . ALPHA LIPOIC ACID PO Take 250 mg by mouth daily.    Marland Kitchen amLODipine (NORVASC) 10 MG tablet TAKE 1 TABLET (10 MG TOTAL) BY MOUTH DAILY. 90 tablet 0  . cholecalciferol (VITAMIN D) 1000 UNITS tablet Take 1,000 Units by mouth daily.     Marland Kitchen CINNAMON PO Take 1 capsule by mouth daily.    . clopidogrel (PLAVIX) 75 MG tablet TAKE 1 TABLET BY MOUTH DAILY. 90 tablet 1  . co-enzyme Q-10 50 MG capsule Take 100 mg by mouth  daily.    . Flaxseed, Linseed, 1000 MG CAPS Take 1 capsule by mouth daily.     . Garlic (GARLIQUE PO) Take 600 mg by mouth daily.     . Magnesium 250 MG TABS Take 1 tablet by mouth daily.    . metoprolol succinate (TOPROL-XL) 25 MG 24 hr tablet TAKE 1/2 TABLET BY MOUTH ONCE A DAY 45 tablet 9  . Multiple Vitamin (MULITIVITAMIN WITH MINERALS) TABS Take 1 tablet by mouth daily.    . ondansetron (ZOFRAN) 4 MG tablet Take 1 tablet (4 mg total) by mouth every 6 (six) hours. 12 tablet 0  . oxyCODONE-acetaminophen (PERCOCET/ROXICET) 5-325 MG tablet Take 1 tablet by mouth every 6 (six) hours as needed for severe pain. 15 tablet 0  . pantoprazole (PROTONIX) 40 MG tablet Take 1 tablet (40 mg total) by mouth daily. 30 tablet 10  . Polyethyl Glycol-Propyl Glycol (SYSTANE OP) Place 1 drop into both eyes daily as needed. For dryt eyes    . rosuvastatin  (CRESTOR) 10 MG tablet Take 10 mg by mouth 3 (three) times a week. On Monday, Wednesday, and Friday     . vitamin B-12 (CYANOCOBALAMIN) 500 MCG tablet Take 500 mcg by mouth daily.    . Vitamin Mixture (ESTER-C) 500-60 MG TABS Take 500 mg by mouth daily.    . nitroGLYCERIN (NITROSTAT) 0.4 MG SL tablet Place 1 tablet (0.4 mg total) under the tongue every 5 (five) minutes as needed for chest pain. 25 tablet 3   No current facility-administered medications for this visit.    REVIEW OF SYSTEMS:   [X]  denotes positive finding, [ ]  denotes negative finding Cardiac  Comments:  Chest pain or chest pressure:    Shortness of breath upon exertion:    Short of breath when lying flat:    Irregular heart rhythm:        Vascular    Pain in calf, thigh, or hip brought on by ambulation:    Pain in feet at night that wakes you up from your sleep:     Blood clot in your veins:    Leg swelling:         Pulmonary    Oxygen at home:    Productive cough:     Wheezing:         Neurologic    Sudden weakness in arms or legs:     Sudden numbness in arms or legs:     Sudden onset of difficulty speaking or slurred speech:    Temporary loss of vision in one eye:     Problems with dizziness:         Gastrointestinal    Blood in stool:     Vomited blood:         Genitourinary    Burning when urinating:     Blood in urine:        Psychiatric    Major depression:         Hematologic    Bleeding problems:    Problems with blood clotting too easily:        Skin    Rashes or ulcers:        Constitutional    Fever or chills:      PHYSICAL EXAM:   Vitals:   04/30/19 1547 04/30/19 1549  BP: (!) 141/79 (!) 156/88  Pulse: 81   Resp: 18   Temp: 97.7 F (36.5 C)   SpO2: 96%   Weight: 157 lb 14.4  oz (71.6 kg)   Height: 5\' 3"  (1.6 m)     GENERAL: The patient is a well-nourished female, in no acute distress. The vital signs are documented above. CARDIAC: There is a regular rate and rhythm.   VASCULAR: Palpable bilateral radial pulses PULMONARY: Non-labored respirations MUSCULOSKELETAL: There are no major deformities or cyanosis. NEUROLOGIC: No focal weakness or paresthesias are detected. SKIN: There are no ulcers or rashes noted. PSYCHIATRIC: The patient has a normal affect.  STUDIES:   I have reviewed the following carotid duplex:   Right Carotid: Velocities in the right ICA are consistent with a 1-39%  stenosis.   Left Carotid: Velocities in the left ICA are consistent with a 60-79%  stenosis.  MEDICAL ISSUES:   Carotid stenosis: Her right carotid endarterectomy site remains widely patent.  Her left side is stable in the 60 to 79% category.  I plan on having her come back in 1 year for a repeat duplex  Arch reconstruction: The patient remained stable.  She is without chest pain.  She has palpable radial pulses bilaterally.    , MD, FACS [facility] [phone] Pager [phone]

## 2019-05-01 ENCOUNTER — Other Ambulatory Visit: Payer: Self-pay | Admitting: *Deleted

## 2019-05-01 DIAGNOSIS — I739 Peripheral vascular disease, unspecified: Secondary | ICD-10-CM

## 2019-05-01 DIAGNOSIS — I6523 Occlusion and stenosis of bilateral carotid arteries: Secondary | ICD-10-CM

## 2019-05-22 ENCOUNTER — Other Ambulatory Visit: Payer: Self-pay | Admitting: Cardiovascular Disease

## 2019-07-11 ENCOUNTER — Ambulatory Visit: Payer: Medicare Other | Admitting: Cardiology

## 2019-07-25 ENCOUNTER — Other Ambulatory Visit: Payer: Self-pay

## 2019-07-25 ENCOUNTER — Encounter: Payer: Self-pay | Admitting: Cardiology

## 2019-07-25 ENCOUNTER — Ambulatory Visit: Payer: Medicare Other | Admitting: Cardiology

## 2019-07-25 VITALS — BP 152/78 | HR 89 | Temp 98.2°F | Ht 63.0 in | Wt 155.0 lb

## 2019-07-25 DIAGNOSIS — F439 Reaction to severe stress, unspecified: Secondary | ICD-10-CM

## 2019-07-25 DIAGNOSIS — Z951 Presence of aortocoronary bypass graft: Secondary | ICD-10-CM

## 2019-07-25 DIAGNOSIS — I10 Essential (primary) hypertension: Secondary | ICD-10-CM | POA: Diagnosis not present

## 2019-07-25 DIAGNOSIS — I739 Peripheral vascular disease, unspecified: Secondary | ICD-10-CM | POA: Diagnosis not present

## 2019-07-25 DIAGNOSIS — E7800 Pure hypercholesterolemia, unspecified: Secondary | ICD-10-CM | POA: Diagnosis not present

## 2019-07-25 NOTE — Progress Notes (Signed)
Cardiology Office Note:    Date:  07/25/2019   ID:  Jennifer [name], DOB November 09, 1940, MRN [id number]  PCP:  Lonie Peak, PA-C  Cardiologist:  No primary care provider on file.  Electrophysiologist:  None   Referring MD: Lonie Peak, PA-C   No chief complaint on file.   History of Present Illness:    Jennifer [name] is a pleasant 79 y.o. female with a hx of hypertension,CAD, PVD,mild CRI, and dyslipidemia. She hadleft carotid to subclavian artery bypass grafting by Dr. Myra Gianotti prior to coronary artery bypass grafting x4 in Nov 2011 by Dr Rexanne Mano. She then had right carotid endarterectomy performed by Dr. Myra Gianotti March 2013. Her last carotid dopplers were March 2021 and showed a patent Lt common carotid to Lt SCA graft, a 60-79% LICA stenosis, and 1-39% RICA stenosis. Dr Early reviewed her study.  I saw her in Jan 2020 and she was having some issues with chest pain. She had been under a great deal of stress, her husband had a stroke (new PAF) and lost peripheral vision. She has to do the driving and yard mowing. I added a PPI, low dose nitrate and obtained a Myoview which fortunately was low risk and her symptoms resolved.   I contacted her virtually in August 2020 and she was doing well.  In November 2020 she was seen in the ED for nephrolithiasis and "passed a stone".  She has had no further issues from that standpoint.   She is seen today in the office for 6 months follow up.  She continues to do well. She denies chest pain.  She says she is active, doing yard work and work around [employer] but doesn't really have a dedicated exercise (walking) routine.  Her husband continues to have problems, he has had recent TIAs which affect his vision and arms.   Past Medical History:  Diagnosis Date  . Anemia   . Arthritis   . Blood transfusion    1965  . Carotid artery occlusion   . Coronary artery disease Oct. 2011  . Eyesight diminished Change in eyesight  . GERD (gastroesophageal  reflux disease)   . Headache(784.0)   . Hypercholesterolemia   . Hypertension   . Joint pain   . Kidney stone   . Shortness of breath on exertion     Past Surgical History:  Procedure Laterality Date  . ABDOMINAL HYSTERECTOMY    . BREAST BIOPSY     BILATERAL  BENIGN   . CARDIAC CATHETERIZATION     2011  DR Algie Coffer AT [facility]  . CARDIOVASCULAR STRESS TEST     2012 [facility] ALSO ECHO    . CAROTID ARTERY - SUBCLAVIAN ARTERY BYPASS GRAFT    . CESAREAN SECTION     X2   . CORONARY ARTERY BYPASS GRAFT     12/2009, x 4 plus 3 in left subclavian  . ENDARTERECTOMY  05/06/2011   Procedure: ENDARTERECTOMY CAROTID;  Surgeon: Nada Libman, MD;  Location: [facility] OR;  Service: Vascular;  Laterality: Right;  right carotid artery endarterctomy with vascu guard patch angioplasty   . EYE SURGERY     LASER BIL X2 FOR GLAUCOMA  . PR VEIN BYPASS GRAFT,AORTO-FEM-POP      Current Medications: Current Meds  Medication Sig  . ALPHA LIPOIC ACID PO Take 250 mg by mouth daily.  Marland Kitchen amLODipine (NORVASC) 10 MG tablet TAKE 1 TABLET (10 MG TOTAL) BY MOUTH DAILY.  . cholecalciferol (VITAMIN D) 1000 UNITS  tablet Take 1,000 Units by mouth daily.   Marland Kitchen CINNAMON PO Take 1 capsule by mouth daily.  . clopidogrel (PLAVIX) 75 MG tablet TAKE 1 TABLET BY MOUTH DAILY.  Marland Kitchen co-enzyme Q-10 50 MG capsule Take 100 mg by mouth daily.  . Flaxseed, Linseed, 1000 MG CAPS Take 1 capsule by mouth daily.   . Garlic (GARLIQUE PO) Take 600 mg by mouth daily.   . Magnesium 250 MG TABS Take 1 tablet by mouth daily.  . metoprolol succinate (TOPROL-XL) 25 MG 24 hr tablet TAKE 1/2 TABLET BY MOUTH ONCE A DAY  . Multiple Vitamin (MULITIVITAMIN WITH MINERALS) TABS Take 1 tablet by mouth daily.  . ondansetron (ZOFRAN) 4 MG tablet Take 1 tablet (4 mg total) by mouth every 6 (six) hours.  Marland Kitchen oxyCODONE-acetaminophen (PERCOCET/ROXICET) 5-325 MG tablet Take 1 tablet by mouth every 6 (six) hours as needed for severe pain.  . pantoprazole (PROTONIX) 40 MG  tablet Take 1 tablet (40 mg total) by mouth daily.  Bertram Gala Glycol-Propyl Glycol (SYSTANE OP) Place 1 drop into both eyes daily as needed. For dryt eyes  . rosuvastatin (CRESTOR) 10 MG tablet Take 10 mg by mouth 3 (three) times a week. On Monday, Wednesday, and Friday   . vitamin B-12 (CYANOCOBALAMIN) 500 MCG tablet Take 500 mcg by mouth daily.  . Vitamin Mixture (ESTER-C) 500-60 MG TABS Take 500 mg by mouth daily.     Allergies:   Statins, Imdur [isosorbide nitrate], and Penicillins   Social History   Socioeconomic History  . Marital status: Married    Spouse name: Not on file  . Number of children: Not on file  . Years of education: Not on file  . Highest education level: Not on file  Occupational History  . Occupation: retired  Tobacco Use  . Smoking status: Former Smoker    Years: 25.00    Quit date: 12/16/2009    Years since quitting: 9.6  . Smokeless tobacco: Never Used  Substance and Sexual Activity  . Alcohol use: Yes    Comment: some  . Drug use: No  . Sexual activity: Not on file  Other Topics Concern  . Not on file  Social History Narrative  . Not on file   Social Determinants of Health   Financial Resource Strain:   . Difficulty of Paying Living Expenses:   Food Insecurity:   . Worried About Programme researcher, broadcasting/film/video in the Last Year:   . Barista in the Last Year:   Transportation Needs:   . Freight forwarder (Medical):   Marland Kitchen Lack of Transportation (Non-Medical):   Physical Activity:   . Days of Exercise per Week:   . Minutes of Exercise per Session:   Stress:   . Feeling of Stress :   Social Connections:   . Frequency of Communication with Friends and Family:   . Frequency of Social Gatherings with Friends and Family:   . Attends Religious Services:   . Active Member of Clubs or Organizations:   . Attends Banker Meetings:   Marland Kitchen Marital Status:      Family History: The patient's family history includes Cancer in her brother  and brother; Diabetes in her mother and sister; Heart attack in her mother; Heart disease in her brother; Hyperlipidemia in her mother and sister; Hypertension in her brother, mother, and sister.  ROS:   Please see the history of present illness.     All other systems reviewed and  are negative.  EKGs/Labs/Other Studies Reviewed:    The following studies were reviewed today: Carotid US 04/30/2019- Summary:  Right Carotid: Velocities in the right ICA are consistent with a 1-39%  stenosis.   Left Carotid: Velocities in the left ICA are consistent with a 60-79%  stenosis.   Vertebrals: Bilateral vertebral arteries demonstrate antegrade flow.  Subclavians: Normal flow hemodynamics were seen in bilateral subclavian        arteries.          The left common carotid to subclavian artery bypass appears        patent.   *See table(s) above for measurements and observations.      Electronically signed by Curt Jews MD on 05/01/2019 at 11:15:44 AM.   EKG:  EKG is ordered today.  The ekg ordered today demonstrates NSR-88, no acute changes  Recent Labs: 12/17/2018: ALT 18; BUN 21; Creatinine, Ser 1.17; Hemoglobin 13.5; Platelets 242; Potassium 4.5; Sodium 135  Recent Lipid Panel    Component Value Date/Time   CHOL 164 03/24/2016 0528   TRIG 62 03/24/2016 0528   HDL 49 03/24/2016 0528   CHOLHDL 3.3 03/24/2016 0528   VLDL 12 03/24/2016 0528   LDLCALC 103 (H) 03/24/2016 0528    Physical Exam:    VS:  BP (!) 152/78   Pulse 89   Temp 98.2 F (36.8 C)   Ht 5\' 3"  (1.6 m)   Wt 155 lb (70.3 kg)   SpO2 96%   BMI 27.46 kg/m     Wt Readings from Last 3 Encounters:  07/25/19 155 lb (70.3 kg)  04/30/19 157 lb 14.4 oz (71.6 kg)  03/07/19 153 lb (69.4 kg)     GEN:  Well nourished, well developed in no acute distress HEENT: Normal NECK: No JVD; surgical scar noted LYMPHATICS: No lymphadenopathy CARDIAC: RRR, no murmurs, rubs, gallops RESPIRATORY:  Clear to  auscultation without rales, wheezing or rhonchi  ABDOMEN: Soft, non-tender, non-distended MUSCULOSKELETAL:  No edema; No deformity  SKIN: Warm and dry NEUROLOGIC:  Alert and oriented x 3 PSYCHIATRIC:  Normal affect   ASSESSMENT:    Hx of CABG CABG x 4 2011-Low risk MyoviewJan 2020- no symptoms to suggest angina  PVD Lt carotid to LSCA bypass prior to CABG 2011, followed by RCE 2013. Last dopplers 2019  Essential hypertension Controlled  Dyslipidemia- Followed by PCP- due for lipids in Nov 2021  Stress- Her husband is chronically ill  PLAN:    I encouraged her to try and find 20-30 minutes a day for herself where she could walk.  F/U with me in 6 months.   Medication Adjustments/Labs and Tests Ordered: Current medicines are reviewed at length with the patient today.  Concerns regarding medicines are outlined above.  Orders Placed This Encounter  Procedures  . Comprehensive metabolic panel  . Lipid panel  . EKG 12-Lead   No orders of the defined types were placed in this encounter.   There are no Patient Instructions on file for this visit.   Signed, Kerin Ransom, PA-C  07/25/2019 3:10 PM    [facility]

## 2019-07-25 NOTE — Patient Instructions (Signed)
Medication Instructions:  Your physician recommends that you continue on your current medications as directed. Please refer to the Current Medication list given to you today.  *If you need a refill on your cardiac medications before your next appointment, please call your pharmacy*   Lab Work: Your physician recommends that you return for a FASTING lipid profile and CMET in November 2021.  If you have labs (blood work) drawn today and your tests are completely normal, you will receive your results only by: Marland Kitchen MyChart Message (if you have MyChart) OR . A paper copy in the mail If you have any lab test that is abnormal or we need to change your treatment, we will call you to review the results.   Follow-Up: At [facility], you and your health needs are our priority.  As part of our continuing mission to provide you with exceptional heart care, we have created designated Provider Care Teams.  These Care Teams include your primary Cardiologist (physician) and Advanced Practice Providers (APPs -  Physician Assistants and Nurse Practitioners) who all work together to provide you with the care you need, when you need it.  We recommend signing up for the patient portal called "MyChart".  Sign up information is provided on this After Visit Summary.  MyChart is used to connect with patients for Virtual Visits (Telemedicine).  Patients are able to view lab/test results, encounter notes, upcoming appointments, etc.  Non-urgent messages can be sent to your provider as well.   To learn more about what you can do with MyChart, go to [email or web address].    Your next appointment:   6 month(s)  The format for your next appointment:   In Person  Provider:   Corine Shelter, PA-C   Other Instructions Please call our office 2 months in advance to schedule your follow-up appointment with [facility].

## 2019-08-07 ENCOUNTER — Other Ambulatory Visit: Payer: Self-pay | Admitting: Surgery

## 2019-09-27 DIAGNOSIS — I6523 Occlusion and stenosis of bilateral carotid arteries: Secondary | ICD-10-CM | POA: Diagnosis not present

## 2019-09-27 DIAGNOSIS — L309 Dermatitis, unspecified: Secondary | ICD-10-CM | POA: Diagnosis not present

## 2019-09-27 DIAGNOSIS — I10 Essential (primary) hypertension: Secondary | ICD-10-CM | POA: Diagnosis not present

## 2019-09-27 DIAGNOSIS — E7800 Pure hypercholesterolemia, unspecified: Secondary | ICD-10-CM | POA: Diagnosis not present

## 2019-09-27 DIAGNOSIS — R7303 Prediabetes: Secondary | ICD-10-CM | POA: Diagnosis not present

## 2019-09-27 DIAGNOSIS — N183 Chronic kidney disease, stage 3 unspecified: Secondary | ICD-10-CM | POA: Diagnosis not present

## 2020-01-02 ENCOUNTER — Other Ambulatory Visit: Payer: Self-pay | Admitting: Cardiovascular Disease

## 2020-01-02 ENCOUNTER — Other Ambulatory Visit: Payer: Self-pay | Admitting: Vascular Surgery

## 2020-01-14 IMAGING — CT CT RENAL STONE PROTOCOL
2 of 4 series · 16 of 46 positions shown, 18 images · non-contrast
Comparison: None.

CLINICAL DATA: Right flank pain and right lower quadrant pain.
Nephrolithiasis.

EXAM:
CT ABDOMEN AND PELVIS WITHOUT CONTRAST
TECHNIQUE: Multidetector CT imaging of the abdomen and pelvis was performed
following the standard protocol without IV contrast.

[Series 2: axial st · axial · 0.68mm/px · z∈[+1009,+1419]mm · 13 of 94 slices shown, 15 images]
[im 6/94  soft-tissue]
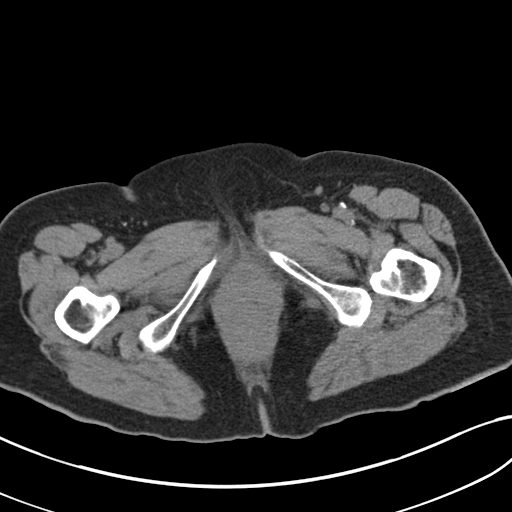
[im 6/94  bone]
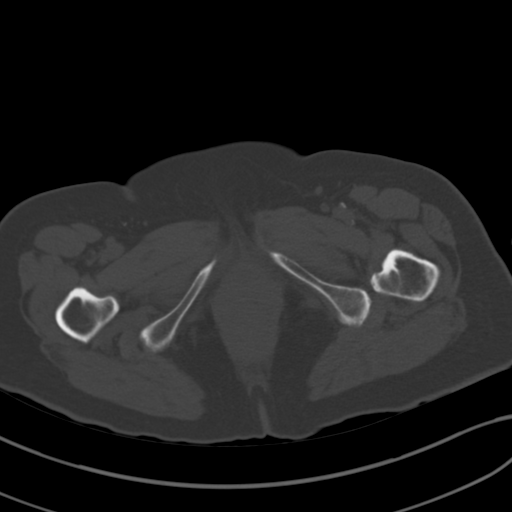
[im 11/94  soft-tissue]
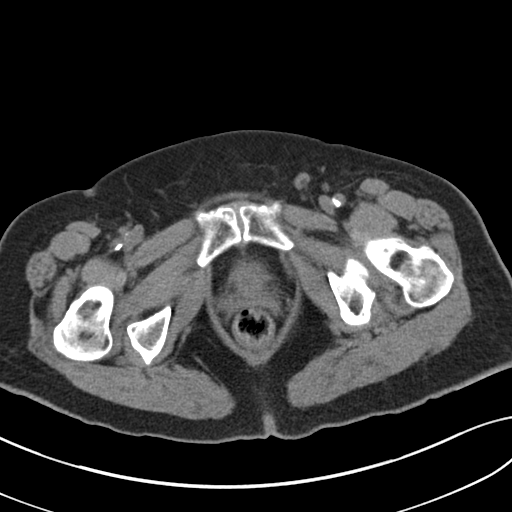
[im 22/94  soft-tissue]
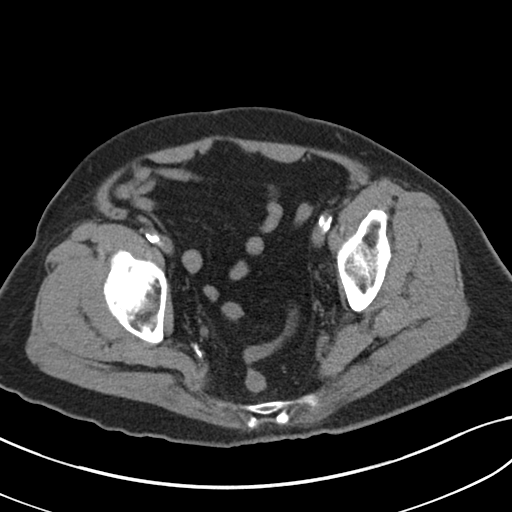
[im 28/94  soft-tissue]
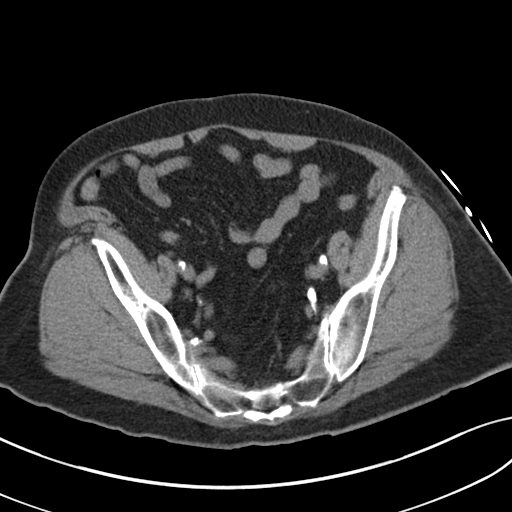
[im 33/94  soft-tissue]
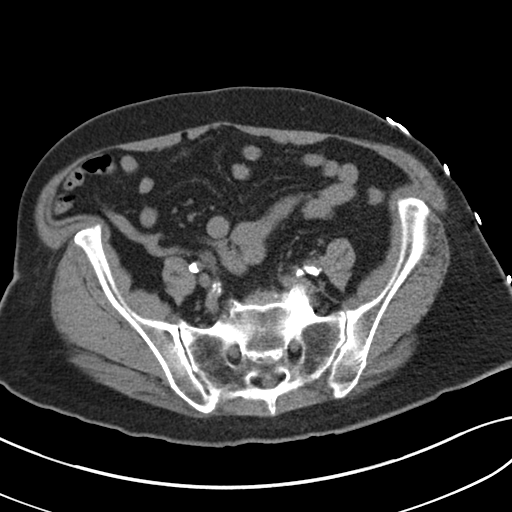
[im 39/94  soft-tissue]
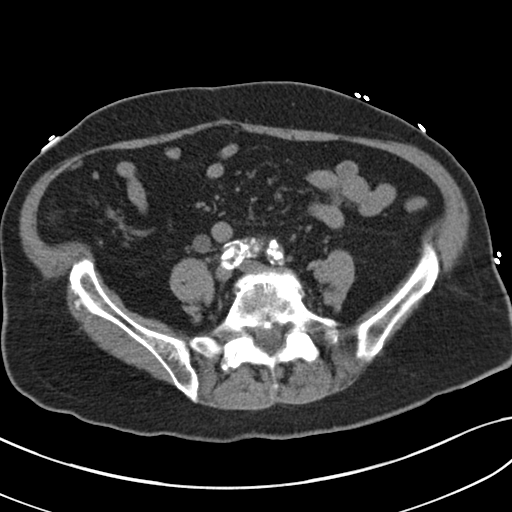
[im 50/94  soft-tissue]
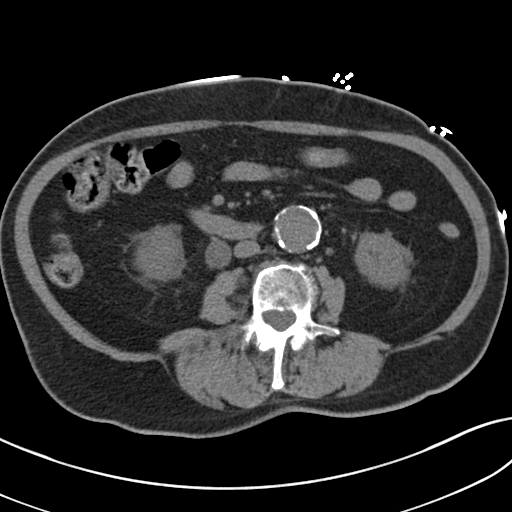
[im 55/94  soft-tissue]
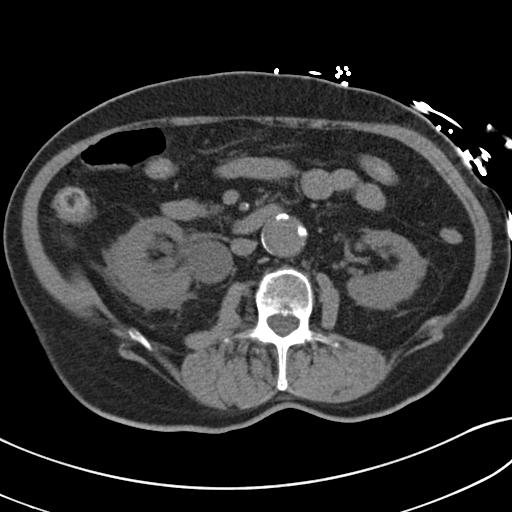
[im 61/94  soft-tissue]
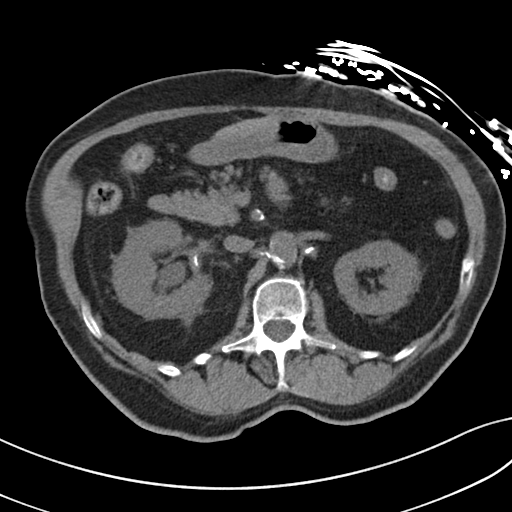
[im 61/94  bone]
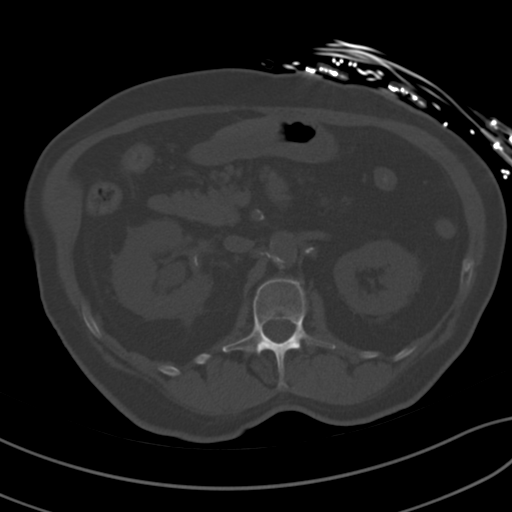
[im 66/94  soft-tissue]
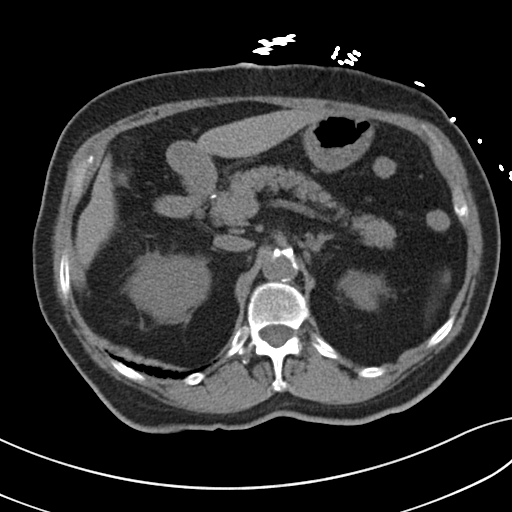
[im 72/94  soft-tissue]
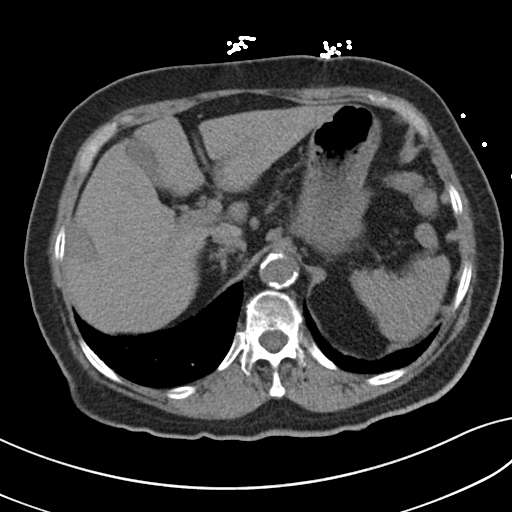
[im 83/94  soft-tissue]
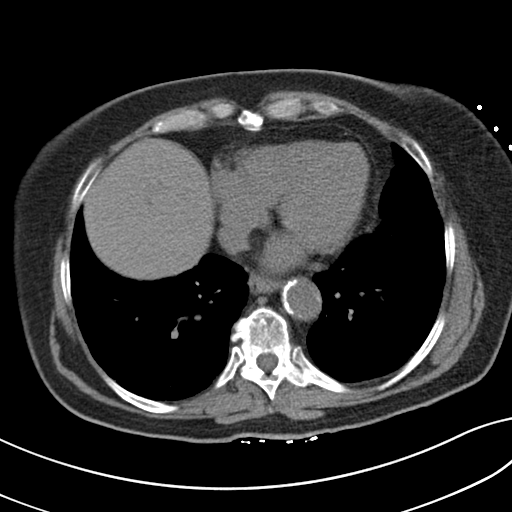
[im 88/94  soft-tissue]
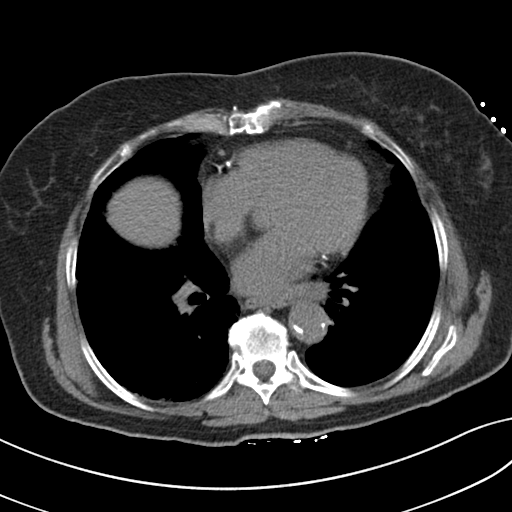

[Series 5: coronal · coronal · 0.69mm/px · 3 of 122 slices shown]
[im 41/122  soft-tissue]
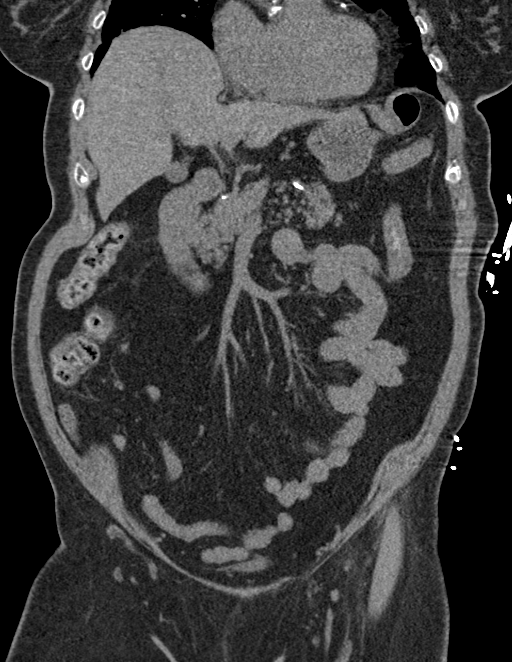
[im 54/122  soft-tissue]
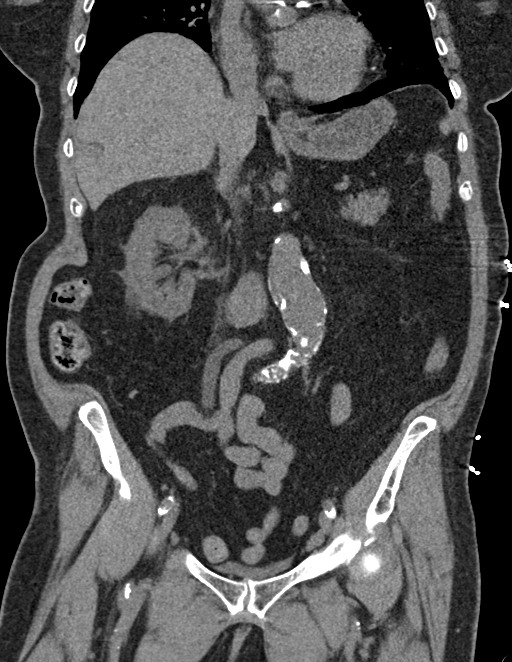
[im 68/122  soft-tissue]
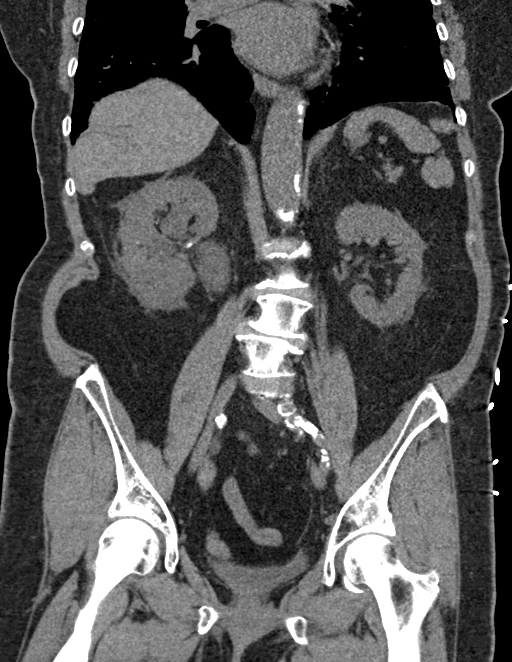

[16 of 46 positions shown; findings below may reference images not displayed]

FINDINGS: Lower chest: No acute findings. Bibasilar interstitial fibrosis and
traction bronchiectasis.

Hepatobiliary: No mass visualized on this unenhanced exam. Tiny
calcified gallstones are seen, however there is no evidence of
cholecystitis or biliary ductal dilatation.

Pancreas: No mass or inflammatory process visualized on this
unenhanced exam.

Spleen:  Within normal limits in size.

Adrenals/Urinary tract: Bilateral renal vascular calcification
noted. Mild-to-moderate right hydroureteronephrosis is seen. A 4 mm
calculus is seen approximately at the right UPJ, and a 2nd 4 mm
calculus is seen distally at the right UVJ.

Stomach/Bowel: No evidence of obstruction, inflammatory process, or
abnormal fluid collections.

Vascular/Lymphatic: No pathologically enlarged lymph nodes
identified. A 3.4 cm infrarenal abdominal aortic aneurysm is new
since previous study. No evidence of aneurysm leak or rupture.

Reproductive: Prior hysterectomy noted. Adnexal regions are
unremarkable in appearance.

Other:  None.

Musculoskeletal:  No suspicious bone lesions identified.
IMPRESSION: Mild-to-moderate right hydroureteronephrosis due to two 4 mm
ureteral calculi at the right UPJ and right UVJ.

3.4 cm infrarenal abdominal aortic aneurysm. Recommend followup by
ultrasound in 3 years. This recommendation follows ACR consensus
guidelines: White Paper of the ACR Incidental Findings Committee II
on Vascular Findings. [HOSPITAL] 1562; [DATE]

Cholelithiasis. No radiographic evidence of cholecystitis.

Bibasilar pulmonary interstitial fibrosis with traction
bronchiectasis.

## 2020-01-31 ENCOUNTER — Other Ambulatory Visit: Payer: Self-pay | Admitting: Cardiovascular Disease

## 2020-04-03 DIAGNOSIS — L309 Dermatitis, unspecified: Secondary | ICD-10-CM | POA: Diagnosis not present

## 2020-04-03 DIAGNOSIS — N183 Chronic kidney disease, stage 3 unspecified: Secondary | ICD-10-CM | POA: Diagnosis not present

## 2020-04-03 DIAGNOSIS — E7800 Pure hypercholesterolemia, unspecified: Secondary | ICD-10-CM | POA: Diagnosis not present

## 2020-04-03 DIAGNOSIS — I2510 Atherosclerotic heart disease of native coronary artery without angina pectoris: Secondary | ICD-10-CM | POA: Diagnosis not present

## 2020-04-03 DIAGNOSIS — R7303 Prediabetes: Secondary | ICD-10-CM | POA: Diagnosis not present

## 2020-04-03 DIAGNOSIS — Z9181 History of falling: Secondary | ICD-10-CM | POA: Diagnosis not present

## 2020-04-03 DIAGNOSIS — I10 Essential (primary) hypertension: Secondary | ICD-10-CM | POA: Diagnosis not present

## 2020-04-03 DIAGNOSIS — I6523 Occlusion and stenosis of bilateral carotid arteries: Secondary | ICD-10-CM | POA: Diagnosis not present

## 2020-04-03 DIAGNOSIS — J309 Allergic rhinitis, unspecified: Secondary | ICD-10-CM | POA: Diagnosis not present

## 2020-04-11 ENCOUNTER — Encounter: Payer: Self-pay | Admitting: Cardiovascular Disease

## 2020-04-11 ENCOUNTER — Other Ambulatory Visit: Payer: Self-pay

## 2020-04-11 ENCOUNTER — Ambulatory Visit: Payer: Medicare Other | Admitting: Cardiovascular Disease

## 2020-04-11 VITALS — BP 144/68 | HR 98 | Ht 63.0 in | Wt 158.2 lb

## 2020-04-11 DIAGNOSIS — I10 Essential (primary) hypertension: Secondary | ICD-10-CM

## 2020-04-11 DIAGNOSIS — E7800 Pure hypercholesterolemia, unspecified: Secondary | ICD-10-CM

## 2020-04-11 DIAGNOSIS — Z951 Presence of aortocoronary bypass graft: Secondary | ICD-10-CM | POA: Diagnosis not present

## 2020-04-11 DIAGNOSIS — I739 Peripheral vascular disease, unspecified: Secondary | ICD-10-CM | POA: Diagnosis not present

## 2020-04-11 NOTE — Assessment & Plan Note (Signed)
History of essential hypertension a blood pressure measured today 144/68. She is on amlodipine and metoprolol.

## 2020-04-11 NOTE — Assessment & Plan Note (Signed)
History of PVD status post left subclavian to carotid bypass at the time of coronary artery bypass grafting in 2011 and subsequent right carotid endarterectomy in 2013. Dr. Myra Gianotti follows this by duplex ultrasound.

## 2020-04-11 NOTE — Progress Notes (Signed)
04/11/2020 Jennifer [name]   11-Aug-1940  [id number]  Primary Physician Lonie Peak, PA-C Primary Cardiologist: Runell Gess MD Nicholes Calamity, [place]  HPI:  Jennifer [name] is a 81 y.o.  mildly overweight married Caucasian female mother of 2 children, grandmother and one grandchild was formally a patient of Dr. Algie Coffer. I last saw her in the office 05/10/2017. 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 Risk factors include discontinue tobacco use having quit in November of 2011 and smoked 25 pack years. She has treated hypertension and dyslipidemia. She has a history of left carotid to subclavian artery bypass grafting by Dr. Myra Gianotti as post coronary artery bypass grafting x411/13/11 by Dr. Rexanne Mano. She's also had a lack of right carotid endarterectomy performed by Dr. Myra Gianotti 3/13 He follows her as an outpatient noninvasively. She denies chest pain or shortness of breath, or claudication.She was recently hospitalized overnight 03/24/16 for "diplopia and was evaluated by Dr. Arbie Cookey at that time. She did have a Myoview performed 03/24/16 which was entirely normal.  Since I saw her a year ago she's remained stable benign chest pain or changes in her breathing. She had a Myoview stress test performed as an outpatient 03/03/2018 which was low risk and nonischemic. She is somewhat statin intolerant and is on Crestor 3 times a week with an LDL of 125, not at goal for secondary prevention.      Allergies  Allergen Reactions  . Statins Anaphylaxis    High dose ( Crestor 20 mg , pt cannot take)  . Imdur [Isosorbide Nitrate] Other (See Comments)    Headache  . Penicillins Hives and Rash    Has patient had a PCN reaction causing immediate rash, facial/tongue/throat swelling, SOB or lightheadedness with hypotension:YES Has patient had a PCN reaction causing severe rash involving mucus membranes or skin necrosis: NO Has patient had a PCN reaction that required hospitalization NO Has  patient had a PCN reaction occurring within the last 10 years: NO If all of the above answers are "NO", then may proceed with Cephalosporin use.    Social History   Socioeconomic History  . Marital status: Married    Spouse name: Not on file  . Number of children: Not on file  . Years of education: Not on file  . Highest education level: Not on file  Occupational History  . Occupation: retired  Tobacco Use  . Smoking status: Former Smoker    Years: 25.00    Quit date: 12/16/2009    Years since quitting: 10.3  . Smokeless tobacco: Never Used  Vaping Use  . Vaping Use: Never used  Substance and Sexual Activity  . Alcohol use: Yes    Comment: some  . Drug use: No  . Sexual activity: Not on file  Other Topics Concern  . Not on file  Social History Narrative  . Not on file   Social Determinants of Health   Financial Resource Strain: Not on file  Food Insecurity: Not on file  Transportation Needs: Not on file  Physical Activity: Not on file  Stress: Not on file  Social Connections: Not on file  Intimate Partner Violence: Not on file     Review of Systems: General: negative for chills, fever, night sweats or weight changes.  Cardiovascular: negative for chest pain, dyspnea on exertion, edema, orthopnea, palpitations, paroxysmal nocturnal dyspnea or shortness of breath Dermatological: negative for rash Respiratory: negative for cough or wheezing Urologic: negative for hematuria  Abdominal: negative for nausea, vomiting, diarrhea, bright red blood per rectum, melena, or hematemesis Neurologic: negative for visual changes, syncope, or dizziness All other systems reviewed and are otherwise negative except as noted above.    Blood pressure (!) 144/68, pulse 98, height 5\' 3"  (1.6 m), weight 158 lb 3.2 oz (71.8 kg), SpO2 95 %.  General appearance: alert and no distress Neck: no adenopathy, no JVD, supple, symmetrical, trachea midline, thyroid not enlarged, symmetric, no  tenderness/mass/nodules and Soft bilateral carotid bruits Lungs: clear to auscultation bilaterally Heart: regular rate and rhythm, S1, S2 normal, no murmur, click, rub or gallop Extremities: extremities normal, atraumatic, no cyanosis or edema Pulses: 2+ and symmetric Skin: Skin color, texture, turgor normal. No rashes or lesions Neurologic: Alert and oriented X 3, normal strength and tone. Normal symmetric reflexes. Normal coordination and gait  EKG sinus rhythm at 98 without ST or T wave changes. I personally reviewed this EKG.  ASSESSMENT AND PLAN:   Essential hypertension History of essential hypertension a blood pressure measured today 144/68. She is on amlodipine and metoprolol.  Hyperlipidemia History of hyperlipidemia on Crestor 3 times a week. Her most recent lipid profile performed by her PCP revealed total cholesterol 197, LDL of 125 and HDL of 47. Currently she is statin intolerant to higher doses. I am going refer her to Dr. lipid clinic for further evaluation and treatment. She may be a candidate for PCSK9.  PVD (peripheral vascular disease) (HCC) History of PVD status post left subclavian to carotid bypass at the time of coronary artery bypass grafting in 2011 and subsequent right carotid endarterectomy in 2013. Dr. 2014 follows this by duplex ultrasound.  Hx of CABG History of CAD status post coronary artery bypass grafting x4 by Dr. Myra Gianotti 12/28/2009. She did have a Myoview stress test performed 03/03/2018 which was low risk and nonischemic. She denies chest pain or shortness of breath.      03/05/2018 MD FACP,FACC,FAHA, [facility] 04/11/2020 10:36 AM

## 2020-04-11 NOTE — Assessment & Plan Note (Signed)
History of hyperlipidemia on Crestor 3 times a week. Her most recent lipid profile performed by her PCP revealed total cholesterol 197, LDL of 125 and HDL of 47. Currently she is statin intolerant to higher doses. I am going refer her to Dr. Blanchie Dessert lipid clinic for further evaluation and treatment. She may be a candidate for PCSK9.

## 2020-04-11 NOTE — Patient Instructions (Signed)
Medication Instructions:  Your physician recommends that you continue on your current medications as directed. Please refer to the Current Medication list given to you today.  *If you need a refill on your cardiac medications before your next appointment, please call your pharmacy*   Follow-Up: At [facility], you and your health needs are our priority.  As part of our continuing mission to provide you with exceptional heart care, we have created designated Provider Care Teams.  These Care Teams include your primary Cardiologist (physician) and Advanced Practice Providers (APPs -  Physician Assistants and Nurse Practitioners) who all work together to provide you with the care you need, when you need it.  We recommend signing up for the patient portal called "MyChart".  Sign up information is provided on this After Visit Summary.  MyChart is used to connect with patients for Virtual Visits (Telemedicine).  Patients are able to view lab/test results, encounter notes, upcoming appointments, etc.  Non-urgent messages can be sent to your provider as well.   To learn more about what you can do with MyChart, go to [email or web address].    Your next appointment:   12 month(s)  The format for your next appointment:   In Person  Provider:   Nanetta Batty, MD   Other Instructions Referral made to our Lipid Clinic with Dr. Rennis Golden.

## 2020-04-11 NOTE — Assessment & Plan Note (Signed)
History of CAD status post coronary artery bypass grafting x4 by Dr. Laneta Simmers 12/28/2009. She did have a Myoview stress test performed 03/03/2018 which was low risk and nonischemic. She denies chest pain or shortness of breath.

## 2020-06-23 ENCOUNTER — Ambulatory Visit: Payer: Medicare Other | Admitting: Surgery

## 2020-06-23 ENCOUNTER — Ambulatory Visit (HOSPITAL_COMMUNITY)
Admission: RE | Admit: 2020-06-23 | Discharge: 2020-06-23 | Disposition: A | Discharge status: Home / Self Care | Payer: Medicare Other | Source: Ambulatory Visit | Attending: Surgery | Admitting: Surgery

## 2020-06-23 ENCOUNTER — Other Ambulatory Visit: Payer: Self-pay

## 2020-06-23 ENCOUNTER — Encounter: Payer: Self-pay | Admitting: Surgery

## 2020-06-23 VITALS — BP 119/73 | HR 84 | Temp 98.0°F | Resp 20 | Ht 63.0 in | Wt 154.0 lb

## 2020-06-23 DIAGNOSIS — I739 Peripheral vascular disease, unspecified: Secondary | ICD-10-CM

## 2020-06-23 DIAGNOSIS — I6523 Occlusion and stenosis of bilateral carotid arteries: Secondary | ICD-10-CM | POA: Insufficient documentation

## 2020-06-23 NOTE — Progress Notes (Signed)
[facility] [place]  Patient name: Jennifer [name] MRN: [id number] DOB: 1940/12/18 Sex: female   REASON FOR VISIT:    Follow up  HISOTRY OF PRESENT ILLNESS:    Jennifer [name] a 80 y.o.femalereturns today for follow-up. In November 2011 she underwent aortic arch reconstruction with bypass graft to the innominate, left carotid, and left subclavian artery. This was done simultaneously with CABG. She has also undergone a right carotid endarterectomy in March 2013 for asymptomatic stenosis.  She remains asymptomatic.  Specifically she denies numbness or weakness in either extremity.  She denies slurred speech.  She denies amaurosis fugax.  She continues to take a statin 3 times a week.    She is medically managed for hypertension.  She has not been having any episodes of chest pain.    PAST MEDICAL HISTORY:   Past Medical History:  Diagnosis Date  . Anemia   . Arthritis   . Blood transfusion    1965  . Carotid artery occlusion   . Coronary artery disease Oct. 2011  . Eyesight diminished Change in eyesight  . GERD (gastroesophageal reflux disease)   . Headache(784.0)   . Hypercholesterolemia   . Hypertension   . Joint pain   . Kidney stone   . Shortness of breath on exertion      FAMILY HISTORY:   Family History  Problem Relation Age of Onset  . Diabetes Mother        Late onset  . Hyperlipidemia Mother   . Hypertension Mother   . Heart attack Mother   . Diabetes Sister        Late onset  . Hyperlipidemia Sister   . Hypertension Sister        AAA  . Cancer Brother        Prostate  . Heart disease Brother   . Hypertension Brother   . Cancer Brother        Prostate     SOCIAL HISTORY:   Social History   Tobacco Use  . Smoking status: Former Smoker    Years: 25.00    Quit date: 12/16/2009    Years since quitting: 10.5  . Smokeless tobacco: Never Used  Substance Use Topics  . Alcohol use: Yes     Comment: some     ALLERGIES:   Allergies  Allergen Reactions  . Statins Anaphylaxis    High dose ( Crestor 20 mg , pt cannot take)  . Imdur [Isosorbide Nitrate] Other (See Comments)    Headache  . Penicillins Hives and Rash    Has patient had a PCN reaction causing immediate rash, facial/tongue/throat swelling, SOB or lightheadedness with hypotension:YES Has patient had a PCN reaction causing severe rash involving mucus membranes or skin necrosis: NO Has patient had a PCN reaction that required hospitalization NO Has patient had a PCN reaction occurring within the last 10 years: NO If all of the above answers are "NO", then may proceed with Cephalosporin use.     CURRENT MEDICATIONS:     REVIEW OF SYSTEMS:   [X]  denotes positive finding, [ ]  denotes negative finding Cardiac  Comments:  Chest pain or chest pressure:    Shortness of breath upon exertion:    Short of breath when lying flat:    Irregular heart rhythm:        Vascular    Pain in calf, thigh, or hip brought on by ambulation:    Pain in feet at night  that wakes you up from your sleep:     Blood clot in your veins:    Leg swelling:         Pulmonary    Oxygen at home:    Productive cough:     Wheezing:         Neurologic    Sudden weakness in arms or legs:     Sudden numbness in arms or legs:     Sudden onset of difficulty speaking or slurred speech:    Temporary loss of vision in one eye:     Problems with dizziness:         Gastrointestinal    Blood in stool:     Vomited blood:         Genitourinary    Burning when urinating:     Blood in urine:        Psychiatric    Major depression:         Hematologic    Bleeding problems:    Problems with blood clotting too easily:        Skin    Rashes or ulcers:        Constitutional    Fever or chills:      PHYSICAL EXAM:   Vitals:   06/23/20 1137 06/23/20 1140  BP: (!) 154/77 119/73  Pulse: 84   Resp: 20   Temp: 98 F (36.7 C)    SpO2: 92%   Weight: 154 lb (69.9 kg)   Height: 5\' 3"  (1.6 m)     GENERAL: The patient is a well-nourished female, in no acute distress. The vital signs are documented above. CARDIAC: There is a regular rate and rhythm.  VASCULAR: Palpable radial pulses bilaterally PULMONARY: Non-labored respirations ABDOMEN: Soft and non-tender with normal pitched bowel sounds.  MUSCULOSKELETAL: There are no major deformities or cyanosis. NEUROLOGIC: No focal weakness or paresthesias are detected. SKIN: There are no ulcers or rashes noted. PSYCHIATRIC: The patient has a normal affect.  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  She will return in 1 year for repeat duplex  Arch reconstruction: She has palpable radial pulses bilaterally.  She is without chest pain.   , MD, FACS [facility] [phone] Pager [phone]

## 2020-08-01 ENCOUNTER — Other Ambulatory Visit: Payer: Self-pay | Admitting: Vascular Surgery

## 2020-10-02 DIAGNOSIS — I10 Essential (primary) hypertension: Secondary | ICD-10-CM | POA: Diagnosis not present

## 2020-10-02 DIAGNOSIS — I6523 Occlusion and stenosis of bilateral carotid arteries: Secondary | ICD-10-CM | POA: Diagnosis not present

## 2020-10-02 DIAGNOSIS — I2510 Atherosclerotic heart disease of native coronary artery without angina pectoris: Secondary | ICD-10-CM | POA: Diagnosis not present

## 2020-10-02 DIAGNOSIS — E7800 Pure hypercholesterolemia, unspecified: Secondary | ICD-10-CM | POA: Diagnosis not present

## 2020-10-02 DIAGNOSIS — L309 Dermatitis, unspecified: Secondary | ICD-10-CM | POA: Diagnosis not present

## 2020-10-02 DIAGNOSIS — R7303 Prediabetes: Secondary | ICD-10-CM | POA: Diagnosis not present

## 2020-10-02 DIAGNOSIS — N183 Chronic kidney disease, stage 3 unspecified: Secondary | ICD-10-CM | POA: Diagnosis not present

## 2020-10-02 DIAGNOSIS — J309 Allergic rhinitis, unspecified: Secondary | ICD-10-CM | POA: Diagnosis not present

## 2020-10-02 DIAGNOSIS — N390 Urinary tract infection, site not specified: Secondary | ICD-10-CM | POA: Diagnosis not present

## 2020-10-27 ENCOUNTER — Other Ambulatory Visit: Payer: Self-pay | Admitting: Cardiovascular Disease

## 2021-01-07 DIAGNOSIS — R0981 Nasal congestion: Secondary | ICD-10-CM | POA: Diagnosis not present

## 2021-01-07 DIAGNOSIS — J029 Acute pharyngitis, unspecified: Secondary | ICD-10-CM | POA: Diagnosis not present

## 2021-01-29 ENCOUNTER — Other Ambulatory Visit: Payer: Self-pay | Admitting: Vascular Surgery

## 2021-04-07 ENCOUNTER — Encounter: Payer: Self-pay | Admitting: Cardiovascular Disease

## 2021-04-07 ENCOUNTER — Other Ambulatory Visit: Payer: Self-pay

## 2021-04-07 ENCOUNTER — Ambulatory Visit: Payer: Medicare Other | Admitting: Cardiovascular Disease

## 2021-04-07 DIAGNOSIS — I10 Essential (primary) hypertension: Secondary | ICD-10-CM

## 2021-04-07 DIAGNOSIS — I739 Peripheral vascular disease, unspecified: Secondary | ICD-10-CM

## 2021-04-07 DIAGNOSIS — Z951 Presence of aortocoronary bypass graft: Secondary | ICD-10-CM | POA: Diagnosis not present

## 2021-04-07 DIAGNOSIS — E782 Mixed hyperlipidemia: Secondary | ICD-10-CM

## 2021-04-07 NOTE — Progress Notes (Signed)
04/07/2021 Jennifer [name]   1940/08/28  [id number]  Primary Physician Cyndi Bender, PA-C Primary Cardiologist: Lorretta Harp MD [name], [place]  HPI:  Jennifer [name] is a 81 y.o.  mildly overweight married Caucasian female mother of 2 children, grandmother and one grandchild was formally a patient of Dr. Doylene Canard. I last saw her in the office 04/11/2020. She is a retired Surveyor, mining for [employer] Risk factors include discontinue tobacco use having quit in November of 2011 and smoked 25 pack years. She has treated hypertension and dyslipidemia. She has a history of left carotid to subclavian artery bypass grafting by Dr. Trula Slade as post coronary artery bypass grafting x4 12/28/09 by Dr. Arvid Right. She's also had a lack of right carotid endarterectomy performed by Dr. Trula Slade 3/13 He follows her as an outpatient noninvasively. She denies chest pain or shortness of breath, or claudication. She was recently hospitalized overnight 03/24/16 for "diplopia and was evaluated by Dr. Donnetta Hutching at that time. She did have a Myoview performed 03/24/16 which was entirely normal.   Since I saw her a year ago she's remained stable benign chest pain or changes in her breathing. She had a Myoview stress test performed as an outpatient 03/03/2018 which was low risk and nonischemic. She is somewhat statin intolerant and is on Crestor 3 times a week with an LDL of 125, not at goal for secondary prevention.     Current Meds  Medication Sig   ALPHA LIPOIC ACID PO Take 250 mg by mouth daily.   amLODipine (NORVASC) 10 MG tablet TAKE 1 TABLET BY MOUTH ONCE DAILY   cholecalciferol (VITAMIN D) 1000 UNITS tablet Take 1,000 Units by mouth daily.    CINNAMON PO Take 1 capsule by mouth daily.   clopidogrel (PLAVIX) 75 MG tablet TAKE 1 TABLET BY MOUTH DAILY.   co-enzyme Q-10 50 MG capsule Take 100 mg by mouth daily.   Flaxseed, Linseed, 1000 MG CAPS Take 1 capsule by mouth daily.    Garlic (GARLIQUE PO) Take  600 mg by mouth daily.    Magnesium 250 MG TABS Take 1 tablet by mouth daily.   metoprolol succinate (TOPROL-XL) 25 MG 24 hr tablet TAKE 1/2 TABLET BY MOUTH ONCE A DAY   Multiple Vitamin (MULITIVITAMIN WITH MINERALS) TABS Take 1 tablet by mouth daily.   ondansetron (ZOFRAN) 4 MG tablet Take 1 tablet (4 mg total) by mouth every 6 (six) hours.   oxyCODONE-acetaminophen (PERCOCET/ROXICET) 5-325 MG tablet Take 1 tablet by mouth every 6 (six) hours as needed for severe pain.   pantoprazole (PROTONIX) 40 MG tablet Take 1 tablet (40 mg total) by mouth daily.   Polyethyl Glycol-Propyl Glycol (SYSTANE OP) Place 1 drop into both eyes daily as needed. For dryt eyes   rosuvastatin (CRESTOR) 10 MG tablet Take 10 mg by mouth 3 (three) times a week. On Monday, Wednesday, and Friday    vitamin B-12 (CYANOCOBALAMIN) 500 MCG tablet Take 500 mcg by mouth daily.   Vitamin Mixture (ESTER-C) 500-60 MG TABS Take 500 mg by mouth daily.     Allergies  Allergen Reactions   Statins Anaphylaxis    High dose ( Crestor 20 mg , pt cannot take)   Imdur [Isosorbide Nitrate] Other (See Comments)    Headache   Penicillins Hives and Rash    Has patient had a PCN reaction causing immediate rash, facial/tongue/throat swelling, SOB or lightheadedness with hypotension:YES Has patient had a PCN reaction causing severe rash  involving mucus membranes or skin necrosis: NO Has patient had a PCN reaction that required hospitalization NO Has patient had a PCN reaction occurring within the last 10 years: NO If all of the above answers are "NO", then may proceed with Cephalosporin use.    Social History   Socioeconomic History   Marital status: Married    Spouse name: Not on file   Number of children: Not on file   Years of education: Not on file   Highest education level: Not on file  Occupational History   Occupation: retired  Tobacco Use   Smoking status: Former    Years: 25.00    Types: Cigarettes    Quit date:  12/16/2009    Years since quitting: 11.3   Smokeless tobacco: Never  Vaping Use   Vaping Use: Never used  Substance and Sexual Activity   Alcohol use: Yes    Comment: some   Drug use: No   Sexual activity: Not on file  Other Topics Concern   Not on file  Social History Narrative   Not on file   Social Determinants of Health   Financial Resource Strain: Not on file  Food Insecurity: Not on file  Transportation Needs: Not on file  Physical Activity: Not on file  Stress: Not on file  Social Connections: Not on file  Intimate Partner Violence: Not on file     Review of Systems: General: negative for chills, fever, night sweats or weight changes.  Cardiovascular: negative for chest pain, dyspnea on exertion, edema, orthopnea, palpitations, paroxysmal nocturnal dyspnea or shortness of breath Dermatological: negative for rash Respiratory: negative for cough or wheezing Urologic: negative for hematuria Abdominal: negative for nausea, vomiting, diarrhea, bright red blood per rectum, melena, or hematemesis Neurologic: negative for visual changes, syncope, or dizziness All other systems reviewed and are otherwise negative except as noted above.    Blood pressure 116/70, pulse 88, height 5\' 3"  (1.6 m), weight 150 lb (68 kg).  General appearance: alert Neck: no adenopathy, no JVD, supple, symmetrical, trachea midline, thyroid not enlarged, symmetric, no tenderness/mass/nodules, and soft left carotid bruit Lungs: clear to auscultation bilaterally Heart: regular rate and rhythm, S1, S2 normal, no murmur, click, rub or gallop Extremities: extremities normal, atraumatic, no cyanosis or edema Pulses: 2+ and symmetric Skin: Skin color, texture, turgor normal. No rashes or lesions Neurologic: Grossly normal  EKG sinus rhythm at 88 with nonspecific ST and T wave changes.  Personally reviewed this EKG.  ASSESSMENT AND PLAN:   Essential hypertension History of essential hypertension with  blood pressure measured today at 116/70.  She is on amlodipine, metoprolol.  Hyperlipidemia History of hyperlipidemia on Crestor every other day.  Her most recent lipid profile performed 10/02/2020 revealed total cholesterol 202, LDL of 129 and HDL 51, not at goal for secondary prevention.  She does relate a history of statin intolerance.  I have offered her PCSK9 but she wishes to increase her Crestor to daily and have her primary, Cyndi Bender, PA-C recheck.  PVD (peripheral vascular disease) ([place]) History of peripheral arterial disease status post left carotid to subclavian bypass grafting by Dr. Trula Slade on back in 2011 at the time of her coronary artery bypass grafting.  She also had carotid endarterectomy performed 3/13 which Dr. Trula Slade follows as an outpatient.  Hx of CABG History of CAD status post coronary artery bypass grafting x4 by Dr. Cyndia Bent 12/28/2009.  She denies chest pain or shortness of breath.  She did have a  Myoview performed 03/03/2026 which was low risk and nonischemic.     Lorretta Harp MD FACP,FACC,FAHA, [facility] 04/07/2021 10:37 AM

## 2021-04-07 NOTE — Assessment & Plan Note (Signed)
History of hyperlipidemia on Crestor every other day.  Her most recent lipid profile performed 10/02/2020 revealed total cholesterol 202, LDL of 129 and HDL 51, not at goal for secondary prevention.  She does relate a history of statin intolerance.  I have offered her PCSK9 but she wishes to increase her Crestor to daily and have her primary, Lonie Peak, PA-C recheck.

## 2021-04-07 NOTE — Patient Instructions (Signed)

## 2021-04-07 NOTE — Assessment & Plan Note (Signed)
History of peripheral arterial disease status post left carotid to subclavian bypass grafting by Dr. Myra Gianotti on back in 2011 at the time of her coronary artery bypass grafting.  She also had carotid endarterectomy performed 3/13 which Dr. Myra Gianotti follows as an outpatient.

## 2021-04-07 NOTE — Assessment & Plan Note (Signed)
History of CAD status post coronary artery bypass grafting x4 by Dr. Cyndia Bent 12/28/2009.  She denies chest pain or shortness of breath.  She did have a Myoview performed 03/03/2026 which was low risk and nonischemic.

## 2021-04-07 NOTE — Assessment & Plan Note (Signed)
History of essential hypertension with blood pressure measured today at 116/70.  She is on amlodipine, metoprolol.

## 2021-04-09 DIAGNOSIS — L309 Dermatitis, unspecified: Secondary | ICD-10-CM | POA: Diagnosis not present

## 2021-04-09 DIAGNOSIS — E7800 Pure hypercholesterolemia, unspecified: Secondary | ICD-10-CM | POA: Diagnosis not present

## 2021-04-09 DIAGNOSIS — N183 Chronic kidney disease, stage 3 unspecified: Secondary | ICD-10-CM | POA: Diagnosis not present

## 2021-04-09 DIAGNOSIS — I10 Essential (primary) hypertension: Secondary | ICD-10-CM | POA: Diagnosis not present

## 2021-04-09 DIAGNOSIS — Z9181 History of falling: Secondary | ICD-10-CM | POA: Diagnosis not present

## 2021-04-09 DIAGNOSIS — I6523 Occlusion and stenosis of bilateral carotid arteries: Secondary | ICD-10-CM | POA: Diagnosis not present

## 2021-04-09 DIAGNOSIS — J309 Allergic rhinitis, unspecified: Secondary | ICD-10-CM | POA: Diagnosis not present

## 2021-04-09 DIAGNOSIS — R7303 Prediabetes: Secondary | ICD-10-CM | POA: Diagnosis not present

## 2021-04-09 DIAGNOSIS — I2510 Atherosclerotic heart disease of native coronary artery without angina pectoris: Secondary | ICD-10-CM | POA: Diagnosis not present

## 2021-06-22 DIAGNOSIS — E785 Hyperlipidemia, unspecified: Secondary | ICD-10-CM | POA: Diagnosis not present

## 2021-06-22 DIAGNOSIS — Z0000 Encounter for general adult medical examination without abnormal findings: Secondary | ICD-10-CM | POA: Diagnosis not present

## 2021-06-22 DIAGNOSIS — Z9181 History of falling: Secondary | ICD-10-CM | POA: Diagnosis not present

## 2021-06-22 DIAGNOSIS — Z139 Encounter for screening, unspecified: Secondary | ICD-10-CM | POA: Diagnosis not present

## 2021-07-18 ENCOUNTER — Other Ambulatory Visit: Payer: Self-pay

## 2021-07-18 DIAGNOSIS — I6523 Occlusion and stenosis of bilateral carotid arteries: Secondary | ICD-10-CM

## 2021-07-27 ENCOUNTER — Ambulatory Visit (INDEPENDENT_AMBULATORY_CARE_PROVIDER_SITE_OTHER): Payer: Medicare Other | Admitting: Physician Assistant

## 2021-07-27 ENCOUNTER — Ambulatory Visit (HOSPITAL_COMMUNITY)
Admission: RE | Admit: 2021-07-27 | Discharge: 2021-07-27 | Disposition: A | Discharge status: Home / Self Care | Payer: Medicare Other | Source: Ambulatory Visit | Attending: Surgery | Admitting: Surgery

## 2021-07-27 VITALS — BP 169/91 | HR 84 | Temp 97.6°F | Resp 20 | Ht 63.0 in | Wt 148.3 lb

## 2021-07-27 DIAGNOSIS — I6523 Occlusion and stenosis of bilateral carotid arteries: Secondary | ICD-10-CM

## 2021-07-27 NOTE — Progress Notes (Signed)
Office Note     CC:  follow up Requesting Provider:  Lonie Peak, PA-C  HPI: Jennifer [name] is a 81 y.o. (11/24/1940) female who presents in follow-up.  In November 2011 she underwent aortic arch reconstruction with bypass graft of the innominate, left carotid, and left subclavian artery.  This was done simultaneously with CABG.  She also underwent right carotid endarterectomy in March 2013 for asymptomatic stenosis.  She denies any diagnosis of CVA or TIA since last office visit.  She also denies any current strokelike symptoms including slurring speech, changes in vision, or one-sided weakness.  She is a former smoker.  She is on a Plavix regimen.  She is on a statin 3 times a week.   Past Medical History:  Diagnosis Date   Anemia    Arthritis    Blood transfusion    1965   Carotid artery occlusion    Coronary artery disease Oct. 2011   Eyesight diminished Change in eyesight   GERD (gastroesophageal reflux disease)    Headache(784.0)    Hypercholesterolemia    Hypertension    Joint pain    Kidney stone    Shortness of breath on exertion     Past Surgical History:  Procedure Laterality Date   ABDOMINAL HYSTERECTOMY     BREAST BIOPSY     BILATERAL  BENIGN    CARDIAC CATHETERIZATION     2011  DR Algie Coffer AT [facility]   CARDIOVASCULAR STRESS TEST     2012 DR KADAKIA ALSO ECHO     CAROTID ARTERY - SUBCLAVIAN ARTERY BYPASS GRAFT     CESAREAN SECTION     X2    CORONARY ARTERY BYPASS GRAFT     12/2009, x 4 plus 3 in left subclavian   ENDARTERECTOMY  05/06/2011   Procedure: ENDARTERECTOMY CAROTID;  Surgeon: Nada Libman, MD;  Location: [facility] OR;  Service: Vascular;  Laterality: Right;  right carotid artery endarterctomy with vascu guard patch angioplasty    EYE SURGERY     LASER BIL X2 FOR GLAUCOMA   PR VEIN BYPASS GRAFT,AORTO-FEM-POP      Social History   Socioeconomic History   Marital status: Married    Spouse name: Not on file   Number of children: Not on file   Years of  education: Not on file   Highest education level: Not on file  Occupational History   Occupation: retired  Tobacco Use   Smoking status: Former    Years: 25.00    Types: Cigarettes    Quit date: 12/16/2009    Years since quitting: 11.6    Passive exposure: Never   Smokeless tobacco: Never  Vaping Use   Vaping Use: Never used  Substance and Sexual Activity   Alcohol use: Yes    Comment: some   Drug use: No   Sexual activity: Not on file  Other Topics Concern   Not on file  Social History Narrative   Not on file   Social Determinants of Health   Financial Resource Strain: Not on file  Food Insecurity: Not on file  Transportation Needs: Not on file  Physical Activity: Not on file  Stress: Not on file  Social Connections: Not on file  Intimate Partner Violence: Not on file     Family History  Problem Relation Age of Onset   Diabetes Mother        Late onset   Hyperlipidemia Mother    Hypertension Mother    Heart attack  Mother    Diabetes Sister        Late onset   Hyperlipidemia Sister    Hypertension Sister        AAA   Cancer Brother        Prostate   Heart disease Brother    Hypertension Brother    Cancer Brother        Prostate     Current Outpatient Medications  Medication Sig Dispense Refill   ALPHA LIPOIC ACID PO Take 250 mg by mouth daily.     amLODipine (NORVASC) 10 MG tablet TAKE 1 TABLET BY MOUTH ONCE DAILY 90 tablet 2   cholecalciferol (VITAMIN D) 1000 UNITS tablet Take 1,000 Units by mouth daily.      CINNAMON PO Take 1 capsule by mouth daily.     clopidogrel (PLAVIX) 75 MG tablet TAKE 1 TABLET BY MOUTH DAILY. 90 tablet 1   co-enzyme Q-10 50 MG capsule Take 100 mg by mouth daily.     Flaxseed, Linseed, 1000 MG CAPS Take 1 capsule by mouth daily.      Garlic (GARLIQUE PO) Take 600 mg by mouth daily.      Magnesium 250 MG TABS Take 1 tablet by mouth daily.     metoprolol succinate (TOPROL-XL) 25 MG 24 hr tablet TAKE 1/2 TABLET BY MOUTH ONCE A  DAY 45 tablet 9   Multiple Vitamin (MULITIVITAMIN WITH MINERALS) TABS Take 1 tablet by mouth daily.     ondansetron (ZOFRAN) 4 MG tablet Take 1 tablet (4 mg total) by mouth every 6 (six) hours. 12 tablet 0   oxyCODONE-acetaminophen (PERCOCET/ROXICET) 5-325 MG tablet Take 1 tablet by mouth every 6 (six) hours as needed for severe pain. 15 tablet 0   pantoprazole (PROTONIX) 40 MG tablet Take 1 tablet (40 mg total) by mouth daily. 30 tablet 10   Polyethyl Glycol-Propyl Glycol (SYSTANE OP) Place 1 drop into both eyes daily as needed. For dryt eyes     rosuvastatin (CRESTOR) 10 MG tablet Take 10 mg by mouth 3 (three) times a week. On Monday, Wednesday, and Friday      vitamin B-12 (CYANOCOBALAMIN) 500 MCG tablet Take 500 mcg by mouth daily.     Vitamin Mixture (ESTER-C) 500-60 MG TABS Take 500 mg by mouth daily.     nitroGLYCERIN (NITROSTAT) 0.4 MG SL tablet Place 1 tablet (0.4 mg total) under the tongue every 5 (five) minutes as needed for chest pain. 25 tablet 3   No current facility-administered medications for this visit.    Allergies  Allergen Reactions   Statins Anaphylaxis    High dose ( Crestor 20 mg , pt cannot take)   Imdur [Isosorbide Nitrate] Other (See Comments)    Headache   Penicillins Hives and Rash    Has patient had a PCN reaction causing immediate rash, facial/tongue/throat swelling, SOB or lightheadedness with hypotension:YES Has patient had a PCN reaction causing severe rash involving mucus membranes or skin necrosis: NO Has patient had a PCN reaction that required hospitalization NO Has patient had a PCN reaction occurring within the last 10 years: NO If all of the above answers are "NO", then may proceed with Cephalosporin use.     REVIEW OF SYSTEMS:   [X]  denotes positive finding, [ ]  denotes negative finding Cardiac  Comments:  Chest pain or chest pressure:    Shortness of breath upon exertion:    Short of breath when lying flat:    Irregular heart rhythm:  Vascular    Pain in calf, thigh, or hip brought on by ambulation:    Pain in feet at night that wakes you up from your sleep:     Blood clot in your veins:    Leg swelling:         Pulmonary    Oxygen at home:    Productive cough:     Wheezing:         Neurologic    Sudden weakness in arms or legs:     Sudden numbness in arms or legs:     Sudden onset of difficulty speaking or slurred speech:    Temporary loss of vision in one eye:     Problems with dizziness:         Gastrointestinal    Blood in stool:     Vomited blood:         Genitourinary    Burning when urinating:     Blood in urine:        Psychiatric    Major depression:         Hematologic    Bleeding problems:    Problems with blood clotting too easily:        Skin    Rashes or ulcers:        Constitutional    Fever or chills:      PHYSICAL EXAMINATION:  Vitals:   07/27/21 1321 07/27/21 1323  BP: 139/83 (!) 169/91  Pulse: 84   Resp: 20   Temp: 97.6 F (36.4 C)   TempSrc: Temporal   SpO2: 90%   Weight: 148 lb 4.8 oz (67.3 kg)   Height: 5\' 3"  (1.6 m)     General:  WDWN in NAD; vital signs documented above Gait: Not observed HENT: WNL, normocephalic Pulmonary: normal non-labored breathing , without Rales, rhonchi,  wheezing Cardiac: regular HR Abdomen: soft, NT, no masses Skin: without rashes Vascular Exam/Pulses:  Right Left  Radial 2+ (normal) 2+ (normal)   Extremities: without ischemic changes, without Gangrene , without cellulitis; without open wounds;  Musculoskeletal: no muscle wasting or atrophy  Neurologic: A&O X 3;  No focal weakness or paresthesias are detected; CN grossly intact Psychiatric:  The pt has Normal affect.   Non-Invasive Vascular Imaging:   Right ICA 1 to 39% stenosis Left ICA 40 to 59% stenosis    ASSESSMENT/PLAN:: 81 y.o. female here for follow up for carotid stenosis surveillance  -Subjectively patient has not had any neurological events since last  office visit 1 year ago -Duplex demonstrates 1 to 39% stenosis of right ICA and 40 to 59% stenosis of left ICA -No indication for revascularization of asymptomatic left ICA at this time.  We will repeat carotid duplex in 1 year.  She will continue her Plavix and statin regimen.  She will call/return office sooner with any questions or concerns.   Emilie RutterMatthew Jaesean Litzau, PA-C [facility] [phone]  Clinic MD:   Myra GianottiBrabham

## 2021-07-28 ENCOUNTER — Other Ambulatory Visit: Payer: Self-pay | Admitting: Cardiovascular Disease

## 2021-07-28 ENCOUNTER — Other Ambulatory Visit: Payer: Self-pay | Admitting: Vascular Surgery

## 2021-08-19 DIAGNOSIS — R300 Dysuria: Secondary | ICD-10-CM | POA: Diagnosis not present

## 2021-09-17 ENCOUNTER — Ambulatory Visit: Payer: Medicare Other | Admitting: Pulmonary Disease

## 2021-09-17 ENCOUNTER — Encounter: Payer: Self-pay | Admitting: Pulmonary Disease

## 2021-09-17 VITALS — BP 142/80 | HR 94 | Temp 98.3°F | Ht 63.0 in | Wt 145.4 lb

## 2021-09-17 DIAGNOSIS — J849 Interstitial pulmonary disease, unspecified: Secondary | ICD-10-CM

## 2021-09-17 LAB — CK: Total CK: 73 U/L (ref 7–177)

## 2021-09-17 NOTE — Progress Notes (Signed)
Jennifer [name]    [id number]    09-11-1940  Primary Care Physician:Conroy, Aldean Jewett  Referring Physician: Lonie Peak, PA-C [address] [place],  [place] [address]  Chief complaint: Consult for interstitial lung disease  HPI: 81 y.o. who  has a past medical history of Anemia, Arthritis, Blood transfusion, Carotid artery occlusion, Coronary artery disease (Oct. 2011), Eyesight diminished (Change in eyesight), GERD (gastroesophageal reflux disease), Headache(784.0), Hypercholesterolemia, Hypertension, Joint pain, Kidney stone, and Shortness of breath on exertion.   She has been referred for evaluation of chest x-ray done at primary care which showed pulmonary fibrosis States that breathing is stable.  Denies any dyspnea, cough.  She does not have any symptoms of joint stiffness, morning stiffness, rash, difficulty swallowing or dry mouth, dry eyes  Pets: Cats Occupation: Used to work as a Media planner Exposures: No mold, hot tub, Financial controller.  No feather pillows or comforter Smoking history: 17-pack-year history.  Quit in 2000 Travel history: No significant travel history Relevant family history: Brother had asthma   Outpatient Encounter Medications as of 09/17/2021  Medication Sig   ALPHA LIPOIC ACID PO Take 250 mg by mouth daily.   amLODipine (NORVASC) 10 MG tablet TAKE 1 TABLET BY MOUTH ONCE DAILY   cholecalciferol (VITAMIN D) 1000 UNITS tablet Take 1,000 Units by mouth daily.    CINNAMON PO Take 1 capsule by mouth daily.   clopidogrel (PLAVIX) 75 MG tablet TAKE 1 TABLET BY MOUTH DAILY.   co-enzyme Q-10 50 MG capsule Take 100 mg by mouth daily.   Flaxseed, Linseed, 1000 MG CAPS Take 1 capsule by mouth daily.    Garlic (GARLIQUE PO) Take 600 mg by mouth daily.    Magnesium 250 MG TABS Take 1 tablet by mouth daily.   metoprolol succinate (TOPROL-XL) 25 MG 24 hr tablet TAKE 1/2 TABLET BY MOUTH ONCE A DAY   Multiple Vitamin (MULITIVITAMIN WITH MINERALS) TABS  Take 1 tablet by mouth daily.   Polyethyl Glycol-Propyl Glycol (SYSTANE OP) Place 1 drop into both eyes daily as needed. For dryt eyes   rosuvastatin (CRESTOR) 10 MG tablet Take 10 mg by mouth 3 (three) times a week. On Monday, Wednesday, and Friday    vitamin B-12 (CYANOCOBALAMIN) 500 MCG tablet Take 500 mcg by mouth daily.   nitroGLYCERIN (NITROSTAT) 0.4 MG SL tablet Place 1 tablet (0.4 mg total) under the tongue every 5 (five) minutes as needed for chest pain.   [DISCONTINUED] ondansetron (ZOFRAN) 4 MG tablet Take 1 tablet (4 mg total) by mouth every 6 (six) hours.   [DISCONTINUED] oxyCODONE-acetaminophen (PERCOCET/ROXICET) 5-325 MG tablet Take 1 tablet by mouth every 6 (six) hours as needed for severe pain.   [DISCONTINUED] pantoprazole (PROTONIX) 40 MG tablet Take 1 tablet (40 mg total) by mouth daily.   [DISCONTINUED] Vitamin Mixture (ESTER-C) 500-60 MG TABS Take 500 mg by mouth daily.   No facility-administered encounter medications on file as of 09/17/2021.    Allergies as of 09/17/2021 - Review Complete 09/17/2021  Allergen Reaction Noted   Statins Anaphylaxis 04/12/2011   Imdur [isosorbide nitrate] Other (See Comments) 03/28/2018   Penicillins Hives and Rash 09/07/2010    Past Medical History:  Diagnosis Date   Anemia    Arthritis    Blood transfusion    1965   Carotid artery occlusion    Coronary artery disease Oct. 2011   Eyesight diminished Change in eyesight   GERD (gastroesophageal reflux disease)  Headache(784.0)    Hypercholesterolemia    Hypertension    Joint pain    Kidney stone    Shortness of breath on exertion     Past Surgical History:  Procedure Laterality Date   ABDOMINAL HYSTERECTOMY     BREAST BIOPSY     BILATERAL  BENIGN    CARDIAC CATHETERIZATION     2011  DR Algie Coffer AT [facility]   CARDIOVASCULAR STRESS TEST     2012 DR KADAKIA ALSO ECHO     CAROTID ARTERY - SUBCLAVIAN ARTERY BYPASS GRAFT     CESAREAN SECTION     X2    CORONARY ARTERY BYPASS  GRAFT     12/2009, x 4 plus 3 in left subclavian   ENDARTERECTOMY  05/06/2011   Procedure: ENDARTERECTOMY CAROTID;  Surgeon: Nada Libman, MD;  Location: [facility] OR;  Service: Vascular;  Laterality: Right;  right carotid artery endarterctomy with vascu guard patch angioplasty    EYE SURGERY     LASER BIL X2 FOR GLAUCOMA   PR VEIN BYPASS GRAFT,AORTO-FEM-POP      Family History  Problem Relation Age of Onset   Diabetes Mother        Late onset   Hyperlipidemia Mother    Hypertension Mother    Heart attack Mother    Diabetes Sister        Late onset   Hyperlipidemia Sister    Hypertension Sister        AAA   Cancer Brother        Prostate   Heart disease Brother    Hypertension Brother    Cancer Brother        Prostate     Social History   Socioeconomic History   Marital status: Married    Spouse name: Not on file   Number of children: Not on file   Years of education: Not on file   Highest education level: Not on file  Occupational History   Occupation: retired  Tobacco Use   Smoking status: Former    Years: 25.00    Types: Cigarettes    Quit date: 12/16/2009    Years since quitting: 11.7    Passive exposure: Never   Smokeless tobacco: Never  Vaping Use   Vaping Use: Never used  Substance and Sexual Activity   Alcohol use: Yes    Comment: some   Drug use: No   Sexual activity: Not on file  Other Topics Concern   Not on file  Social History Narrative   Not on file   Social Determinants of Health   Financial Resource Strain: Not on file  Food Insecurity: Not on file  Transportation Needs: Not on file  Physical Activity: Not on file  Stress: Not on file  Social Connections: Not on file  Intimate Partner Violence: Not on file    Review of systems: Review of Systems  Constitutional: Negative for fever and chills.  HENT: Negative.   Eyes: Negative for blurred vision.  Respiratory: as per HPI  Cardiovascular: Negative for chest pain and palpitations.   Gastrointestinal: Negative for vomiting, diarrhea, blood per rectum. Genitourinary: Negative for dysuria, urgency, frequency and hematuria.  Musculoskeletal: Negative for myalgias, back pain and joint pain.  Skin: Negative for itching and rash.  Neurological: Negative for dizziness, tremors, focal weakness, seizures and loss of consciousness.  Endo/Heme/Allergies: Negative for environmental allergies.  Psychiatric/Behavioral: Negative for depression, suicidal ideas and hallucinations.  All other systems reviewed and are negative.  Physical Exam: Blood pressure (!) 142/80, pulse 94, temperature 98.3 F (36.8 C), temperature source Oral, height 5\' 3"  (1.6 m), weight 145 lb 6.4 oz (66 kg), SpO2 92 %. Gen:      No acute distress HEENT:  EOMI, sclera anicteric Neck:     No masses; no thyromegaly Lungs:    Bibasal crackles CV:         Regular rate and rhythm; no murmurs Abd:      + bowel sounds; soft, non-tender; no palpable masses, no distension Ext:    No edema; adequate peripheral perfusion Skin:      Warm and dry; no rash Neuro: alert and oriented x 3 Psych: normal mood and affect  Data Reviewed: Imaging: CTA 02/28/2010-bibasal opacities, atelectasis  CT renal stone 12/17/2018-visualized lung bases show interstitial fibrosis with traction bronchiectasis  Chest x-ray 08/10/2021-bilateral chronic interstitial lung disease, pulmonary fibrosis I have reviewed the images personally  PFTs:  Labs:  Assessment:  Assessment for interstitial lung disease I have reviewed her images showing pulmonary fibrosis.  This is seen back in 2020 on a CT renal stone study with bilateral traction bronchiectasis and basal fibrosis.  There are no exposures, no signs and symptoms of connective tissue disease Get CTD serologies, high-res CT and PFTs Return to clinic in 1 to 2 months for review and plan for next steps.  Plan/Recommendations: CTD serologies, high-res CT, PFT  2021  MD [facility] 09/17/2021, 10:08 AM  CC: 11/17/2021, PA-C

## 2021-09-17 NOTE — Patient Instructions (Signed)
We will get some blood tests for further assessment of interstitial lung disease  Schedule high-resolution CT and PFTs at next available Follow-up in clinic after PFTs

## 2021-09-18 LAB — ANA: Anti Nuclear Antibody (ANA): NEGATIVE

## 2021-09-18 LAB — SJOGREN'S SYNDROME ANTIBODS(SSA + SSB)
SSA (Ro) (ENA) Antibody, IgG: <1 AI
SSB (La) (ENA) Antibody, IgG: <1 AI

## 2021-09-18 LAB — ANTI-DNA ANTIBODY, DOUBLE-STRANDED: ds DNA Ab: 2 IU/mL

## 2021-09-18 LAB — CYCLIC CITRUL PEPTIDE ANTIBODY, IGG: Cyclic Citrullin Peptide Ab: 83 UNITS — ABNORMAL HIGH

## 2021-09-18 LAB — RHEUMATOID FACTOR: Rheumatoid fact SerPl-aCnc: <14 IU/mL (ref <14)

## 2021-09-18 LAB — ANTI-SCLERODERMA ANTIBODY: Scleroderma (Scl-70) (ENA) Antibody, IgG: <1 AI

## 2021-09-19 LAB — ALDOLASE: Aldolase: 3.9 U/L (ref <=8.1)

## 2021-09-24 LAB — HYPERSENSITIVITY PNEUMONITIS
A. Pullulans Abs: NEGATIVE
A.Fumigatus #1 Abs: NEGATIVE
Micropolyspora faeni, IgG: NEGATIVE
Pigeon Serum Abs: NEGATIVE
Thermoact. Saccharii: NEGATIVE
Thermoactinomyces vulgaris, IgG: NEGATIVE

## 2021-09-24 LAB — RNP ANTIBODIES: ENA RNP Ab: 0.3 AI (ref 0.0–0.9)

## 2021-09-25 ENCOUNTER — Other Ambulatory Visit (HOSPITAL_BASED_OUTPATIENT_CLINIC_OR_DEPARTMENT_OTHER): Payer: Medicare Other

## 2021-09-28 ENCOUNTER — Encounter (HOSPITAL_COMMUNITY): Payer: Self-pay

## 2021-09-28 ENCOUNTER — Ambulatory Visit (HOSPITAL_COMMUNITY)
Admission: RE | Admit: 2021-09-28 | Discharge: 2021-09-28 | Disposition: A | Discharge status: Home / Self Care | Payer: Medicare Other | Source: Ambulatory Visit | Attending: Pulmonary Disease | Admitting: Pulmonary Disease

## 2021-09-28 DIAGNOSIS — J84112 Idiopathic pulmonary fibrosis: Secondary | ICD-10-CM | POA: Diagnosis not present

## 2021-09-28 DIAGNOSIS — J479 Bronchiectasis, uncomplicated: Secondary | ICD-10-CM | POA: Diagnosis not present

## 2021-09-28 DIAGNOSIS — I700 Atherosclerosis of aorta: Secondary | ICD-10-CM | POA: Diagnosis not present

## 2021-09-28 DIAGNOSIS — J849 Interstitial pulmonary disease, unspecified: Secondary | ICD-10-CM | POA: Insufficient documentation

## 2021-09-28 DIAGNOSIS — J432 Centrilobular emphysema: Secondary | ICD-10-CM | POA: Diagnosis not present

## 2021-10-09 ENCOUNTER — Telehealth: Payer: Self-pay | Admitting: Pulmonary Disease

## 2021-10-09 DIAGNOSIS — R768 Other specified abnormal immunological findings in serum: Secondary | ICD-10-CM

## 2021-10-13 NOTE — Telephone Encounter (Signed)
Patient returned call.  Dr. Shirlee More results and recommendations given.  Patient has scheduled PFT and OV.  Rheumatology referral placed.  Nothing further at this time.

## 2021-10-13 NOTE — Telephone Encounter (Signed)
Per Dr. Isaiah Serge-  Labs show increase in CCP which may indicate rheumatoid arthritis.  Please make a referral to rheumatology In addition her CT confirms scarring in the lung Please make sure that she has PFTs and follow-up in pulmonary clinic.  ATC patient.  LM for patient to call back for results.  Patient needs full PFT and follow up OV with Dr. Isaiah Serge. Patient PFT order already placed.

## 2021-10-15 DIAGNOSIS — R7303 Prediabetes: Secondary | ICD-10-CM | POA: Diagnosis not present

## 2021-10-15 DIAGNOSIS — J8410 Pulmonary fibrosis, unspecified: Secondary | ICD-10-CM | POA: Diagnosis not present

## 2021-10-15 DIAGNOSIS — N183 Chronic kidney disease, stage 3 unspecified: Secondary | ICD-10-CM | POA: Diagnosis not present

## 2021-10-15 DIAGNOSIS — I2510 Atherosclerotic heart disease of native coronary artery without angina pectoris: Secondary | ICD-10-CM | POA: Diagnosis not present

## 2021-10-15 DIAGNOSIS — E7800 Pure hypercholesterolemia, unspecified: Secondary | ICD-10-CM | POA: Diagnosis not present

## 2021-10-15 DIAGNOSIS — R0902 Hypoxemia: Secondary | ICD-10-CM | POA: Diagnosis not present

## 2021-10-15 DIAGNOSIS — I6523 Occlusion and stenosis of bilateral carotid arteries: Secondary | ICD-10-CM | POA: Diagnosis not present

## 2021-10-15 DIAGNOSIS — I10 Essential (primary) hypertension: Secondary | ICD-10-CM | POA: Diagnosis not present

## 2021-10-22 DIAGNOSIS — R0902 Hypoxemia: Secondary | ICD-10-CM | POA: Diagnosis not present

## 2021-11-04 ENCOUNTER — Telehealth: Payer: Self-pay | Admitting: Pulmonary Disease

## 2021-11-04 MED ORDER — AZITHROMYCIN 250 MG PO TABS: ORAL_TABLET | ORAL | 0 refills | Status: DC

## 2021-11-04 MED ORDER — PREDNISONE 20 MG PO TABS: ORAL_TABLET | ORAL | 0 refills | Status: DC

## 2021-11-04 NOTE — Telephone Encounter (Signed)
Called and spoke with patient's husband Jori Moll.  Dr. Matilde Bash recommendations given. Jori Moll stated patient tested for Covid and it was negative.  Prescriptions sent to requested [facility].  Nothing further at this time.

## 2021-11-04 NOTE — Telephone Encounter (Signed)
I would advise her to get COVID tested Okay to send in prescription for Z-Pak and prednisone 40 mg a day for 5 days

## 2021-11-04 NOTE — Telephone Encounter (Signed)
Called and spoke with pt's spouse Jori Moll who states that pt's cough has been worse for the past 2-3 days. States that pt will occasionally cough up yellow phlegm and also pt has some mild wheezing. Jori Moll states that symptoms were worse at night but now they are getting worse during the day. Pt also has increased SOB. Asked what pt's sats have been ranging at and he stated they have been around 92%. Pt has tried robitussin DM and has been doing cough drops which have not really helped with her symptoms.  Jori Moll is wanting to know what might be recommended and if prednisone might be able to be prescribed to see if it would help with pt's symptoms. Dr. Vaughan Browner, please advise.

## 2021-11-09 ENCOUNTER — Telehealth: Payer: Self-pay | Admitting: Pulmonary Disease

## 2021-11-09 MED ORDER — PREDNISONE 20 MG PO TABS: 40.0000 mg | ORAL_TABLET | Freq: Every day | ORAL | 1 refills | Status: DC

## 2021-11-09 NOTE — Telephone Encounter (Signed)
Spoke with the pt's spouse and notified of response per Dr Vaughan Browner  He verbalized understanding  I have sent rx for pred  Nothing further needed

## 2021-11-09 NOTE — Telephone Encounter (Signed)
Ok to send in additional doses of prednisone 40mg /day till she can be seen back in clinic at next scheduled visit

## 2021-11-09 NOTE — Telephone Encounter (Signed)
Spoke with the pt's spouse, Ron, [place] per DPR   He states that the pt was given pred and zpack 11/04/21:  Marshell Garfinkel, MD      11/04/21  4:42 PM Note I would advise her to get COVID tested Okay to send in prescription for Z-Pak and prednisone 40 mg a day for 5 days     She completed rxs and cough had improved by last dose of pred, but now has worsened again. Her cough is non prod. She feels weak from cough. Covid test neg. Denies any wheezing, increased SOB. She is scheduled for PFT and ov with Dr Vaughan Browner on 12/18/21. He is asking if we can send in more pred to get her through until this appt. Prefers to keep planned appt since PFT same day. Please advise, thanks!

## 2021-11-09 NOTE — Telephone Encounter (Signed)
Patient's husband called and states patient is coughing very bad and was prescribed a zpak and prednisone a while ago and the prednisone was beginning to help, but she was only given 5 pills. Patient would like about 3 or 4 weeks worth to last her until her PFT and OV appointment 11/3. Uses [facility], please advise.

## 2021-12-18 ENCOUNTER — Ambulatory Visit: Payer: Medicare Other | Admitting: Pulmonary Disease

## 2021-12-18 ENCOUNTER — Ambulatory Visit (INDEPENDENT_AMBULATORY_CARE_PROVIDER_SITE_OTHER): Payer: Medicare Other | Admitting: Pulmonary Disease

## 2021-12-18 ENCOUNTER — Encounter: Payer: Self-pay | Admitting: Pulmonary Disease

## 2021-12-18 VITALS — BP 142/76 | HR 89 | Temp 98.2°F | Ht 62.5 in | Wt 150.6 lb

## 2021-12-18 DIAGNOSIS — Z5181 Encounter for therapeutic drug level monitoring: Secondary | ICD-10-CM | POA: Diagnosis not present

## 2021-12-18 DIAGNOSIS — R0602 Shortness of breath: Secondary | ICD-10-CM | POA: Diagnosis not present

## 2021-12-18 DIAGNOSIS — J849 Interstitial pulmonary disease, unspecified: Secondary | ICD-10-CM

## 2021-12-18 LAB — PULMONARY FUNCTION TEST
DL/VA % pred: 59 %
DL/VA: 2.47 ml/min/mmHg/L
DLCO cor % pred: 37 %
DLCO cor: 6.73 ml/min/mmHg
DLCO unc % pred: 37 %
DLCO unc: 6.73 ml/min/mmHg
FEF 25-75 Post: 2.67 L/sec
FEF 25-75 Pre: 2.61 L/sec
FEF2575-%Change-Post: 2 %
FEF2575-%Pred-Post: 193 %
FEF2575-%Pred-Pre: 189 %
FEV1-%Change-Post: 2 %
FEV1-%Pred-Post: 89 %
FEV1-%Pred-Pre: 87 %
FEV1-Post: 1.64 L
FEV1-Pre: 1.6 L
FEV1FVC-%Change-Post: 0 %
FEV1FVC-%Pred-Pre: 114 %
FEV6-%Change-Post: 5 %
FEV6-%Pred-Post: 83 %
FEV6-%Pred-Pre: 78 %
FEV6-Post: 1.94 L
FEV6-Pre: 1.83 L
FEV6FVC-%Change-Post: 0 %
FEV6FVC-%Pred-Post: 105 %
FEV6FVC-%Pred-Pre: 105 %
FVC-%Change-Post: 2 %
FVC-%Pred-Post: 78 %
FVC-%Pred-Pre: 76 %
FVC-Post: 1.95 L
FVC-Pre: 1.89 L
Post FEV1/FVC ratio: 84 %
Post FEV6/FVC ratio: 100 %
Pre FEV1/FVC ratio: 84 %
Pre FEV6/FVC Ratio: 100 %
RV % pred: 84 %
RV: 1.93 L
TLC % pred: 99 %
TLC: 4.82 L

## 2021-12-18 LAB — COMPREHENSIVE METABOLIC PANEL
ALT: 30 U/L (ref 0–35)
AST: 21 U/L (ref 0–37)
Albumin: 4 g/dL (ref 3.5–5.2)
Alkaline Phosphatase: 45 U/L (ref 39–117)
BUN: 27 mg/dL — ABNORMAL HIGH (ref 6–23)
CO2: 30 mEq/L (ref 19–32)
Calcium: 9.7 mg/dL (ref 8.4–10.5)
Chloride: 99 mEq/L (ref 96–112)
Creatinine, Ser: 1.07 mg/dL (ref 0.40–1.20)
GFR: 49.44 mL/min — ABNORMAL LOW (ref >60.00)
Glucose, Bld: 85 mg/dL (ref 70–99)
Potassium: 3.9 mEq/L (ref 3.5–5.1)
Sodium: 136 mEq/L (ref 135–145)
Total Bilirubin: 0.4 mg/dL (ref 0.2–1.2)
Total Protein: 7.3 g/dL (ref 6.0–8.3)

## 2021-12-18 NOTE — Patient Instructions (Signed)
Full PFT Performed Today  

## 2021-12-18 NOTE — Patient Instructions (Signed)
We have discussed your CT findings showing scarring in the lung.  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

## 2021-12-18 NOTE — Progress Notes (Signed)
Jennifer [name]    [id number]    [birth year]  Primary Care Physician:Conroy, Tamala Julian  Referring Physician: Cyndi Bender, PA-C [address] [place],  [place] [address]  Chief complaint: Follow-up for interstitial lung disease  HPI: 81 y.o. who  has a past medical history of Anemia, Arthritis, Blood transfusion, Carotid artery occlusion, Coronary artery disease (Oct. 2011), Eyesight diminished (Change in eyesight), GERD (gastroesophageal reflux disease), Headache(784.0), Hypercholesterolemia, Hypertension, Joint pain, Kidney stone, and Shortness of breath on exertion.   She has been referred for evaluation of chest x-ray done at primary care which showed pulmonary fibrosis States that breathing is stable.  Denies any dyspnea, cough.  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  No feather pillows or comforter Smoking history: 17-pack-year history.  Quit in 2000 Travel history: No significant travel history Relevant family history: Brother had asthma  Interim history: Here for review of CT, PFTs and plan for next steps. States that breathing is stable with no issues.   Outpatient Encounter Medications as of 12/18/2021  Medication Sig   ALPHA LIPOIC ACID PO Take 250 mg by mouth daily.   amLODipine (NORVASC) 10 MG tablet TAKE 1 TABLET BY MOUTH ONCE DAILY   cholecalciferol (VITAMIN D) 1000 UNITS tablet Take 1,000 Units by mouth daily.    CINNAMON PO Take 1 capsule by mouth daily.   clopidogrel (PLAVIX) 75 MG tablet TAKE 1 TABLET BY MOUTH DAILY.   co-enzyme Q-10 50 MG capsule Take 100 mg by mouth daily.   Flaxseed, Linseed, 1000 MG CAPS Take 1 capsule by mouth daily.    Garlic (GARLIQUE PO) Take 600 mg by mouth daily.    Magnesium 250 MG TABS Take 1 tablet by mouth daily.   metoprolol  succinate (TOPROL-XL) 25 MG 24 hr tablet TAKE 1/2 TABLET BY MOUTH ONCE A DAY   Multiple Vitamin (MULITIVITAMIN WITH MINERALS) TABS Take 1 tablet by mouth daily.   Polyethyl Glycol-Propyl Glycol (SYSTANE OP) Place 1 drop into both eyes daily as needed. For dryt eyes   predniSONE (DELTASONE) 20 MG tablet Take 2 tablets (40 mg total) by mouth daily with breakfast.   rosuvastatin (CRESTOR) 10 MG tablet Take 10 mg by mouth 3 (three) times a week. On Monday, Wednesday, and Friday    vitamin B-12 (CYANOCOBALAMIN) 500 MCG tablet Take 500 mcg by mouth daily.   [DISCONTINUED] azithromycin (ZITHROMAX) 250 MG tablet Take as directed   [DISCONTINUED] predniSONE (DELTASONE) 20 MG tablet Take 40mg  for 5 days   nitroGLYCERIN (NITROSTAT) 0.4 MG SL tablet Place 1 tablet (0.4 mg total) under the tongue every 5 (five) minutes as needed for chest pain.   No facility-administered encounter medications on file as of 12/18/2021.    Physical Exam: Blood pressure (!) 142/76, pulse 89, temperature 98.2 F (36.8 C), temperature source Oral, height 5' 2.5" (1.588 m), weight 150 lb 9.6 oz (68.3 kg), SpO2 93 %. 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 I have reviewed the images personally  PFTs: 12/18/2021 FVC 1.95 [78%], FEV1 1.64 [89%], F/F 84, TLC 4.82 [9 9%], DLCO 6.73 [37%] Severe reduction in diffusion  capacity  Labs: CTD serologies 09/17/2021-significant only for CCP of 83.  Assessment:  Assessment for interstitial lung disease I have reviewed her images showing pulmonary fibrosis.  This is seen back in 2020 on a CT renal stone study with bilateral traction bronchiectasis and basal fibrosis.  Now with repeat CT scan showing progressive pulmonary fibrosis compared to 2020 with UIP pattern. There are no exposures, no signs and symptoms of connective tissue disease.  The CCP is elevated suspicion for rheumatoid arthritis is low.  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 We had a long discussion about risk/benefit and side effects of treatments and elected to go with Esbriet.  Check CMP and proBNP for baseline evaluation  Assessment for pulmonary hypertension PFTs show isolated significant diffusion impairment suggestive of pulmonary hypertension.  Will check proBNP and order echocardiogram  Plan/Recommendations: Start paperwork for Esbriet Check complete metabolic panel, proBNP Echocardiogram  Chilton Greathouse MD [facility] 12/18/2021, 11:14 AM  CC: Lonie Peak, PA-C

## 2021-12-18 NOTE — Progress Notes (Signed)
Full PFT Performed Today  

## 2021-12-19 LAB — PRO B NATRIURETIC PEPTIDE: NT-Pro BNP: 357 pg/mL (ref 0–738)

## 2021-12-21 ENCOUNTER — Telehealth: Payer: Self-pay

## 2021-12-21 NOTE — Telephone Encounter (Signed)
Received New start paperwork for ESBRIET. Will update as we work through the benefits process.  Submitted a Prior Authorization request to [facility] for ESBRIET via CoverMyMeds. Will update once we receive a response.   Key: [id number]

## 2021-12-23 ENCOUNTER — Other Ambulatory Visit (HOSPITAL_COMMUNITY): Payer: Self-pay

## 2021-12-23 NOTE — Telephone Encounter (Signed)
Submitted Patient Assistance Application to [employer] for ESBRIET along with provider portion, PA and income documents. Will update patient when we receive a response.  Fax# [phone] Phone# [phone] 

## 2021-12-23 NOTE — Telephone Encounter (Signed)
Received notification from [facility] regarding a prior authorization for Brand-name ESBRIET. Authorization has been APPROVED from 12/21/2021 to 02/15/2023. Approval letter sent to scan center.  Per test claim, copay for 30 days supply is $3,043.14. Of note, approval also covers brand-name Esbriet 801mg  tablets.  Authorization #   Will proceed with compiling documentation for PAP submission.

## 2021-12-29 ENCOUNTER — Ambulatory Visit (HOSPITAL_COMMUNITY)
Admission: RE | Admit: 2021-12-29 | Discharge: 2021-12-29 | Disposition: A | Discharge status: Home / Self Care | Payer: Medicare Other | Source: Ambulatory Visit | Attending: Pulmonary Disease | Admitting: Pulmonary Disease

## 2021-12-29 DIAGNOSIS — I119 Hypertensive heart disease without heart failure: Secondary | ICD-10-CM | POA: Insufficient documentation

## 2021-12-29 DIAGNOSIS — J8410 Pulmonary fibrosis, unspecified: Secondary | ICD-10-CM | POA: Insufficient documentation

## 2021-12-29 DIAGNOSIS — R0602 Shortness of breath: Secondary | ICD-10-CM | POA: Insufficient documentation

## 2021-12-29 DIAGNOSIS — R0600 Dyspnea, unspecified: Secondary | ICD-10-CM | POA: Diagnosis not present

## 2021-12-29 LAB — ECHOCARDIOGRAM COMPLETE
Area-P 1/2: 4.21 cm2
S' Lateral: 2.3 cm

## 2022-01-04 NOTE — Telephone Encounter (Signed)
Received a fax from [place] regarding an approval for Esbriet patient assistance from 12/31/2021 until patient no longer qualifies due to discontinuation of therapy, changes to patient's current financial status or health insurance and/or patient no longer meets the program eligibility requirements. ATC pt but had to LVM. Provided update and explained next steps to take if they have not already been contacted by either [employer] or [employer]. Phone numbers to both provided. Requested that pt reach out to [place] here at the clinic with any additional questions or concerns.  Relevant Documents have been sent to scan center.  [employer] Phone#: [phone] option 5 [employer] Phone#: [phone]

## 2022-01-06 ENCOUNTER — Telehealth: Payer: Self-pay | Admitting: Pulmonary Disease

## 2022-01-06 DIAGNOSIS — R768 Other specified abnormal immunological findings in serum: Secondary | ICD-10-CM

## 2022-01-06 NOTE — Telephone Encounter (Signed)
Called patient and spoke to patients husband and he is asking is we can alter the rx for Esbriet. Husband is asking if the new rx could be for   Esbriet 801mg  dose 3 times a day  Instead of the 9 pills a day  Please advise sir

## 2022-01-11 ENCOUNTER — Telehealth: Payer: Self-pay

## 2022-01-11 NOTE — Progress Notes (Signed)
Called patient and gave results of ECHO per DR. Mannam.  Patient verbalized understanding.  No further questions noted.

## 2022-01-11 NOTE — Telephone Encounter (Signed)
Called pt to give info regarding ECHO results. Patient wants to get update regarding changing the dosage on medication (Esbriet) to 801 mg dose 3 x a day She wants to know if Dr. Mannam would prescribe her prednisone instead of Esbriet. She originally wanted a dose modification but now feels like she does very well while on prednisone. She states she is having a hard time affording her meds and prednisone is less expensive. Please advise.  

## 2022-01-11 NOTE — Telephone Encounter (Signed)
Called pt to give info regarding ECHO results. Patient wants to get update regarding changing the dosage on medication (Esbriet) to 801 mg dose 3 x a day She wants to know if Dr. Isaiah Serge would prescribe her prednisone instead of Esbriet. She originally wanted a dose modification but now feels like she does very well while on prednisone. She states she is having a hard time affording her meds and prednisone is less expensive. Please advise.

## 2022-01-13 ENCOUNTER — Telehealth: Payer: Self-pay

## 2022-01-13 DIAGNOSIS — J849 Interstitial pulmonary disease, unspecified: Secondary | ICD-10-CM

## 2022-01-13 DIAGNOSIS — J84112 Idiopathic pulmonary fibrosis: Secondary | ICD-10-CM

## 2022-01-13 NOTE — Telephone Encounter (Signed)
Called patient to provide Esbriet counseling; however, patient states that she has not completed [employer] and [employer] on-boarding. Provided phone numbers for her to complete this. Patient indicated that she will call them tomorrow morning and call me back when she is done with it. Hope to receive a call tomorrow.   Patient indicated that she was unsure about this medication. Answered questions regarding most common adverse events (nausea -- taking with food helps), dosing (tapers up over time), and the role of Esbriet in relation to prednisone (Esbriet will actually stop further lung scarring and prednisone will help with symptoms). Provided phone number to patient to let me know when she completes on-boarding and ask questions when she has them.   Georgeann Oppenheim [employer] [facility] [employer] PharmD Candidate [phone]

## 2022-01-18 ENCOUNTER — Other Ambulatory Visit: Payer: Self-pay | Admitting: Vascular Surgery

## 2022-01-19 MED ORDER — ESBRIET 267 MG PO TABS: ORAL_TABLET | ORAL | 0 refills | Status: DC

## 2022-01-19 NOTE — Telephone Encounter (Signed)
Subjective:  Patient called today by [facility] pharmacy team for Esbriet new start.   Patient was last seen by Dr. Vaughan Browner on 12/18/2021.  Pertinent past medical history includes HLD, former smoker, PVD, HTN.   History of elevated LFTs: No History of diarrhea, nausea, vomiting: No  Objective: Allergies  Allergen Reactions   Statins Anaphylaxis    High dose ( Crestor 20 mg , pt cannot take)   Imdur [Isosorbide Nitrate] Other (See Comments)    Headache   Penicillins Hives and Rash    Has patient had a PCN reaction causing immediate rash, facial/tongue/throat swelling, SOB or lightheadedness with hypotension:YES Has patient had a PCN reaction causing severe rash involving mucus membranes or skin necrosis: NO Has patient had a PCN reaction that required hospitalization NO Has patient had a PCN reaction occurring within the last 10 years: NO If all of the above answers are "NO", then may proceed with Cephalosporin use.    Outpatient Encounter Medications as of 01/13/2022  Medication Sig   ALPHA LIPOIC ACID PO Take 250 mg by mouth daily.   amLODipine (NORVASC) 10 MG tablet TAKE 1 TABLET BY MOUTH ONCE DAILY   cholecalciferol (VITAMIN D) 1000 UNITS tablet Take 1,000 Units by mouth daily.    CINNAMON PO Take 1 capsule by mouth daily.   co-enzyme Q-10 50 MG capsule Take 100 mg by mouth daily.   Flaxseed, Linseed, 1000 MG CAPS Take 1 capsule by mouth daily.    Garlic (GARLIQUE PO) Take 600 mg by mouth daily.    Magnesium 250 MG TABS Take 1 tablet by mouth daily.   metoprolol succinate (TOPROL-XL) 25 MG 24 hr tablet TAKE 1/2 TABLET BY MOUTH ONCE A DAY   Multiple Vitamin (MULITIVITAMIN WITH MINERALS) TABS Take 1 tablet by mouth daily.   nitroGLYCERIN (NITROSTAT) 0.4 MG SL tablet Place 1 tablet (0.4 mg total) under the tongue every 5 (five) minutes as needed for chest pain.   Polyethyl Glycol-Propyl Glycol (SYSTANE OP) Place 1 drop into both eyes daily as needed. For dryt eyes    predniSONE (DELTASONE) 20 MG tablet Take 2 tablets (40 mg total) by mouth daily with breakfast.   rosuvastatin (CRESTOR) 10 MG tablet Take 10 mg by mouth 3 (three) times a week. On Monday, Wednesday, and Friday    vitamin B-12 (CYANOCOBALAMIN) 500 MCG tablet Take 500 mcg by mouth daily.   [DISCONTINUED] clopidogrel (PLAVIX) 75 MG tablet TAKE 1 TABLET BY MOUTH DAILY.   No facility-administered encounter medications on file as of 01/13/2022.     Immunization History  Administered Date(s) Administered   DT (Pediatric) 10/07/2013   Fluad Quad(high Dose 65+) 11/15/2021   Influenza, High Dose Seasonal PF 10/26/2016, 11/23/2017, 11/24/2018   PFIZER(Purple Top)SARS-COV-2 Vaccination 04/13/2019      PFT's TLC  Date Value Ref Range Status  12/18/2021 4.82 L Final      CMP     Component Value Date/Time   NA 136 12/18/2021 1152   K 3.9 12/18/2021 1152   CL 99 12/18/2021 1152   CO2 30 12/18/2021 1152   GLUCOSE 85 12/18/2021 1152   BUN 27 (H) 12/18/2021 1152   CREATININE 1.07 12/18/2021 1152   CALCIUM 9.7 12/18/2021 1152   PROT 7.3 12/18/2021 1152   ALBUMIN 4.0 12/18/2021 1152   AST 21 12/18/2021 1152   ALT 30 12/18/2021 1152   ALKPHOS 45 12/18/2021 1152   BILITOT 0.4 12/18/2021 1152   GFRNONAA 45 (L) 12/17/2018 1215   GFRAA 52 (  L) 12/17/2018 1215    CBC    Component Value Date/Time   WBC 11.1 (H) 12/17/2018 1215   RBC 4.33 12/17/2018 1215   HGB 13.5 12/17/2018 1215   HCT 42.2 12/17/2018 1215   PLT 242 12/17/2018 1215   MCV 97.5 12/17/2018 1215   MCH 31.2 12/17/2018 1215   MCHC 32.0 12/17/2018 1215   RDW 12.9 12/17/2018 1215   LYMPHSABS 0.8 12/17/2018 1215   MONOABS 0.3 12/17/2018 1215   EOSABS 0.0 12/17/2018 1215   BASOSABS 0.0 12/17/2018 1215    LFT's    Latest Ref Rng & Units 12/18/2021   11:52 AM 12/17/2018   12:15 PM 03/23/2016    6:31 PM  Hepatic Function  Total Protein 6.0 - 8.3 g/dL 7.3  8.3  7.8   Albumin 3.5 - 5.2 g/dL 4.0  4.5  4.6   AST 0 - 37 U/L 21   26  33   ALT 0 - 35 U/L _0 Alk Phosphatase 39 - 117 U/L 45  64  60   Total Bilirubin 0.2 - 1.2 mg/dL 0.4  0.4  0.6     HRCT (09/29/2021) -   Assessment and Plan  Esbriet Medication Management Thoroughly counseled patient on the efficacy, mechanism of action, dosing, administration, adverse effects, and monitoring parameters of Esbriet.  Patient verbalized understanding.   Goals of Therapy: Will not stop or reverse the progression of ILD. It will slow the progression of ILD.   Dosing: Starting dose will be Esbriet 267 mg 1 tablet three times daily for 7 days, then 2 tablets three times daily for 7 days, then 3 tablets three times daily.  Maintenance dose will be 801 mg 1 tablet three times daily if tolerated.  Stressed the importance of taking with meals and space at least 5-6 hours apart to minimize stomach upset.   Adverse Effects: Nausea, vomiting, diarrhea, weight loss Abdominal pain GERD Sun sensitivity/rash - patient advised to wear sunscreen when exposed to sunlight Dizziness Fatigue  Monitoring: Monitor for diarrhea, nausea and vomiting, GI perforation, hepatotoxicity  Monitor LFTs - baseline, monthly for first 6 months, then every 3 months routinely CBC w differential at baseline and every 3 months routinely Patient would prefer to have labs completed at PCP office. She will call them and ask if we can send orders there.  Access: Approval of Esbriet through: patient assistance Rx sent to: [place] [facility], dispensing) for [place]: [phone] Patient is receiving first shipment on 01/22/2022.  Medication Reconciliation A drug regimen assessment was performed, including review of allergies, interactions, disease-state management, dosing and immunization history. Medications were reviewed with the patient, including name, instructions, indication, goals of therapy, potential side effects, importance of adherence, and safe use.  This appointment required 30 minutes  of patient care (this includes precharting, chart review, review of results, face-to-face care, etc.).  Thank you for involving pharmacy to assist in providing this patient's care.   Knox Saliva, PharmD, MPH, BCPS, CPP Clinical Pharmacist (Rheumatology and Pulmonology)

## 2022-01-22 NOTE — Addendum Note (Signed)
Addended by: Christen Butter on: 01/22/2022 10:11 AM   Modules accepted: Orders

## 2022-01-22 NOTE — Telephone Encounter (Signed)
Spoke with the pt and notified of need for rheum appt and also scheduled her to see Dr Isaiah Serge on Friday 02/26/22. She is aware rheum will call to schedule appt and she needs to be see by them. Nothing further needed.

## 2022-01-22 NOTE — Telephone Encounter (Signed)
Follow-up I called and discussed with the patient.  She is going to start Esbriet this week Will see how she does with this medication.  Hold off on starting prednisone for now  Can you make sure that she has a rheumatology consult for elevated CCP and make a follow-up visit in January with me.

## 2022-01-25 ENCOUNTER — Telehealth: Payer: Self-pay | Admitting: Pulmonary Disease

## 2022-01-25 NOTE — Telephone Encounter (Signed)
Called and spoke with Windy Fast [facility]) patient's husband.  Windy Fast stated patient has not started Esbriet, because she is afraid of possible side effects.  Windy Fast stated patient was wanting to go back on Prednisone daily, because Prednisone makes her feel better.  Ronald requested Prednisone prescription be sent to [facility]. Patient is scheduled 02/26/22 for follow up with Dr. Isaiah Serge.   Message routed to Dr. Isaiah Serge to advise on Prednisone prescription

## 2022-01-28 MED ORDER — PREDNISONE 10 MG PO TABS: 10.0000 mg | ORAL_TABLET | Freq: Every day | ORAL | 1 refills | Status: DC

## 2022-01-28 NOTE — Telephone Encounter (Signed)
Please send in order for prednisone 10mg /day

## 2022-01-28 NOTE — Telephone Encounter (Signed)
Called and spoke with Windy Fast.  Dr. Shirlee More recommendations given.  Prednisone prescription sent to requested [facility].  Nothing further at this time.

## 2022-02-26 ENCOUNTER — Encounter: Payer: Self-pay | Admitting: Pulmonary Disease

## 2022-02-26 ENCOUNTER — Ambulatory Visit: Payer: Medicare Other | Admitting: Pulmonary Disease

## 2022-02-26 VITALS — BP 126/76 | HR 91 | Temp 98.3°F | Ht 63.0 in | Wt 155.0 lb

## 2022-02-26 DIAGNOSIS — J849 Interstitial pulmonary disease, unspecified: Secondary | ICD-10-CM | POA: Diagnosis not present

## 2022-02-26 NOTE — Patient Instructions (Addendum)
I am glad you are stable with your breathing Continue prednisone at 10 mg a day for now as it is helping with your breathing Will hold off on antifibrotic therapy per your preference Follow-up in 6 months

## 2022-02-26 NOTE — Progress Notes (Signed)
Jennifer [name]    [id number]    1940/08/13  Primary Care Physician:Conroy, Tamala Julian  Referring Physician: Cyndi Bender, PA-C [address] [place],  [place] [address]  Chief complaint: Follow-up for interstitial lung disease  HPI: 82 y.o. who  has a past medical history of Anemia, Arthritis, Blood transfusion, Carotid artery occlusion, Coronary artery disease (Oct. 2011), Eyesight diminished (Change in eyesight), GERD (gastroesophageal reflux disease), Headache(784.0), Hypercholesterolemia, Hypertension, Joint pain, Kidney stone, and Shortness of breath on exertion.   She has been referred for evaluation of chest x-ray done at primary care which showed pulmonary fibrosis States that breathing is stable.  Denies any dyspnea, cough.  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  No feather pillows or comforter Smoking history: 17-pack-year history.  Quit in 2000 Travel history: No significant travel history Relevant family history: Brother had asthma  Interim history: At last visit we had started paperwork for Esbriet.  She had a chance to think about it and has declined starting any antifibrotic's due to concern for side effects She states that prednisone helps with her breathing and has requested to be placed on prednisone Currently on prednisone 10 mg/day  Outpatient Encounter Medications as of 02/26/2022  Medication Sig   ALPHA LIPOIC ACID PO Take 250 mg by mouth daily.   amLODipine (NORVASC) 10 MG tablet TAKE 1 TABLET BY MOUTH ONCE DAILY   cholecalciferol (VITAMIN D) 1000 UNITS tablet Take 1,000 Units by mouth daily.    CINNAMON PO Take 1 capsule by mouth daily.   clopidogrel (PLAVIX) 75 MG tablet TAKE 1 TABLET BY MOUTH DAILY.   co-enzyme Q-10 50 MG capsule Take 100 mg by mouth daily.    Flaxseed, Linseed, 1000 MG CAPS Take 1 capsule by mouth daily.    Garlic (GARLIQUE PO) Take 600 mg by mouth daily.    Magnesium 250 MG TABS Take 1 tablet by mouth daily.   metoprolol succinate (TOPROL-XL) 25 MG 24 hr tablet TAKE 1/2 TABLET BY MOUTH ONCE A DAY   Multiple Vitamin (MULITIVITAMIN WITH MINERALS) TABS Take 1 tablet by mouth daily.   Polyethyl Glycol-Propyl Glycol (SYSTANE OP) Place 1 drop into both eyes daily as needed. For dryt eyes   predniSONE (DELTASONE) 10 MG tablet Take 1 tablet (10 mg total) by mouth daily with breakfast.   rosuvastatin (CRESTOR) 10 MG tablet Take 10 mg by mouth 3 (three) times a week. On Monday, Wednesday, and Friday    vitamin B-12 (CYANOCOBALAMIN) 500 MCG tablet Take 500 mcg by mouth daily.   ESBRIET 267 MG TABS Take 1 tab three times daily for 7 days, then 2 tabs three times daily for 7 days, then 3 tabs three times daily thereafter. (Patient not taking: Reported on 02/26/2022)   nitroGLYCERIN (NITROSTAT) 0.4 MG SL tablet Place 1 tablet (0.4 mg total) under the tongue every 5 (five) minutes as needed for chest pain.   No facility-administered encounter medications on file as of 02/26/2022.    Physical Exam: Blood pressure 126/76, pulse 91, temperature 98.3 F (36.8 C), temperature source Oral, height 5\' 3"  (1.6 m), weight 155 lb (70.3 kg), SpO2 94 %. Gen:      No acute distress HEENT:  EOMI, sclera anicteric Neck:     No masses; no thyromegaly Lungs:  Clear to auscultation bilaterally; normal respiratory effort CV:         Regular rate and rhythm; no murmurs Abd:      + bowel sounds; soft, non-tender; no palpable masses, no distension Ext:    No edema; adequate peripheral perfusion Skin:      Warm and dry; no rash Neuro: alert and oriented x 3 Psych: normal mood and affect   Data Reviewed: Imaging: CTA 02/28/2010-bibasal opacities, atelectasis  CT renal stone 12/17/2018-visualized lung bases show interstitial fibrosis with traction  bronchiectasis  High-resolution CT 09/28/2021-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 I have reviewed the images personally  PFTs: 12/18/2021 FVC 1.95 [78%], FEV1 1.64 [89%], F/F 84, TLC 4.82 [9 9%], DLCO 6.73 [37%] Severe reduction in diffusion capacity  Labs: CTD serologies 09/17/2021-significant only for CCP of 83.  Cardiac: Echocardiogram 12/29/2021-LVEF 60 to 65%, mild concentric LVH, grade 1 diastolic dysfunction.  Normal RV systolic size and function.  Assessment:  Assessment for interstitial lung disease I have reviewed her images showing pulmonary fibrosis.  This is seen back in 2020 on a CT renal stone study with bilateral traction bronchiectasis and basal fibrosis.  Now with repeat CT scan showing progressive pulmonary fibrosis compared to 2020 with UIP pattern. There are no exposures, no signs and symptoms of connective tissue disease.  The CCP is elevated suspicion for rheumatoid arthritis is low.  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 We had a long discussion about risk/benefit and side effects of treatments and she has decided against any antifibrotic treatment. Currently on prednisone 10 mg a day which helps with her breathing and gives her increased energy.  She would like to continue this for now  Assessment for pulmonary hypertension PFTs show isolated significant diffusion impairment suggestive of pulmonary hypertension.  Echocardiogram reviewed which does not show any elevated pulmonary hypertension  Plan/Recommendations: Hold off on antifibrotics per patient preference Continue prednisone at 10 mg/day  Marshell Garfinkel MD [facility] 02/26/2022, 9:57 AM  CC: Cyndi Bender, PA-C

## 2022-03-30 ENCOUNTER — Telehealth: Payer: Self-pay | Admitting: Pulmonary Disease

## 2022-03-31 MED ORDER — PREDNISONE 10 MG PO TABS: 10.0000 mg | ORAL_TABLET | Freq: Every day | ORAL | 3 refills | Status: AC

## 2022-03-31 NOTE — Telephone Encounter (Signed)
Advised pt of prednisone refill. Nothing further needed.

## 2022-03-31 NOTE — Telephone Encounter (Signed)
Called and spoke with patient husband Jennifer [name]. Jennifer [name] stated that he wanted to know if Dr. Vaughan Browner could send a prescription in for prednisone for the patient due to her having pulmonary fibrosis. He stated that the patients PCP had mentioned to them to ask Dr. Vaughan Browner. Patients pharmacy is [facility].   PM, please advise.

## 2022-03-31 NOTE — Telephone Encounter (Signed)
I have sent in a prescription for prednisone 76m/day

## 2022-04-20 DIAGNOSIS — R7303 Prediabetes: Secondary | ICD-10-CM | POA: Diagnosis not present

## 2022-04-20 DIAGNOSIS — E7800 Pure hypercholesterolemia, unspecified: Secondary | ICD-10-CM | POA: Diagnosis not present

## 2022-04-20 DIAGNOSIS — J8410 Pulmonary fibrosis, unspecified: Secondary | ICD-10-CM | POA: Diagnosis not present

## 2022-04-20 DIAGNOSIS — I2510 Atherosclerotic heart disease of native coronary artery without angina pectoris: Secondary | ICD-10-CM | POA: Diagnosis not present

## 2022-04-20 DIAGNOSIS — N183 Chronic kidney disease, stage 3 unspecified: Secondary | ICD-10-CM | POA: Diagnosis not present

## 2022-04-20 DIAGNOSIS — I6523 Occlusion and stenosis of bilateral carotid arteries: Secondary | ICD-10-CM | POA: Diagnosis not present

## 2022-04-20 DIAGNOSIS — I10 Essential (primary) hypertension: Secondary | ICD-10-CM | POA: Diagnosis not present

## 2022-04-29 ENCOUNTER — Other Ambulatory Visit: Payer: Self-pay | Admitting: Cardiovascular Disease

## 2022-07-13 ENCOUNTER — Other Ambulatory Visit: Payer: Self-pay | Admitting: Vascular Surgery

## 2022-07-14 ENCOUNTER — Telehealth: Payer: Self-pay | Admitting: Surgery

## 2022-07-14 NOTE — Telephone Encounter (Signed)
Calling to schedule recall

## 2022-08-26 DIAGNOSIS — Z139 Encounter for screening, unspecified: Secondary | ICD-10-CM | POA: Diagnosis not present

## 2022-08-26 DIAGNOSIS — R6889 Other general symptoms and signs: Secondary | ICD-10-CM | POA: Diagnosis not present

## 2022-08-26 DIAGNOSIS — J9611 Chronic respiratory failure with hypoxia: Secondary | ICD-10-CM | POA: Diagnosis not present

## 2022-08-26 DIAGNOSIS — M545 Low back pain, unspecified: Secondary | ICD-10-CM | POA: Diagnosis not present

## 2022-08-26 DIAGNOSIS — J8410 Pulmonary fibrosis, unspecified: Secondary | ICD-10-CM | POA: Diagnosis not present

## 2022-09-03 DIAGNOSIS — J9611 Chronic respiratory failure with hypoxia: Secondary | ICD-10-CM | POA: Diagnosis not present

## 2022-09-15 DIAGNOSIS — B029 Zoster without complications: Secondary | ICD-10-CM | POA: Diagnosis not present

## 2022-09-15 DIAGNOSIS — N1831 Chronic kidney disease, stage 3a: Secondary | ICD-10-CM | POA: Diagnosis not present

## 2022-09-23 ENCOUNTER — Telehealth: Payer: Self-pay | Admitting: Pulmonary Disease

## 2022-10-04 DIAGNOSIS — J9611 Chronic respiratory failure with hypoxia: Secondary | ICD-10-CM | POA: Diagnosis not present

## 2022-11-04 DIAGNOSIS — J9611 Chronic respiratory failure with hypoxia: Secondary | ICD-10-CM | POA: Diagnosis not present

## 2022-12-04 DIAGNOSIS — J9611 Chronic respiratory failure with hypoxia: Secondary | ICD-10-CM | POA: Diagnosis not present

## 2023-01-04 DIAGNOSIS — J9611 Chronic respiratory failure with hypoxia: Secondary | ICD-10-CM | POA: Diagnosis not present

## 2023-02-03 DIAGNOSIS — J9611 Chronic respiratory failure with hypoxia: Secondary | ICD-10-CM | POA: Diagnosis not present

## 2023-02-04 ENCOUNTER — Other Ambulatory Visit: Payer: Self-pay | Admitting: Cardiovascular Disease

## 2023-03-06 DIAGNOSIS — J9611 Chronic respiratory failure with hypoxia: Secondary | ICD-10-CM | POA: Diagnosis not present

## 2023-04-06 ENCOUNTER — Other Ambulatory Visit: Payer: Self-pay | Admitting: Cardiovascular Disease

## 2023-04-06 DIAGNOSIS — J9611 Chronic respiratory failure with hypoxia: Secondary | ICD-10-CM | POA: Diagnosis not present

## 2023-04-11 ENCOUNTER — Telehealth: Payer: Self-pay | Admitting: Pulmonary Disease

## 2023-04-11 MED ORDER — PREDNISONE 10 MG PO TABS: 10.0000 mg | ORAL_TABLET | Freq: Every day | ORAL | 1 refills | Status: DC

## 2023-04-11 NOTE — Telephone Encounter (Signed)
 Patient is sick and needs prednisone. She usually gets prednisone when she feels this way. She is currently unable to leave the house.  Pharmacy: [facility]

## 2023-04-11 NOTE — Telephone Encounter (Signed)
 Called and spoke with Windy Fast, patient's husband (DPR), he states she would like to continue on the Prednisone 10 mg daily and she is about to run out and knows she should not run out. He was not calling because she was sick.  She has pulmonary fibrosis.  She is on oxygen 8L 24/7 and her oxygen levels are good.  I verified that patient is to continue prednisone and verified pharmacy.  I let him know I would send in the prescription to her pharmacy.  Advised to call back if she needed anything additionally.

## 2023-04-11 NOTE — Telephone Encounter (Signed)
 I called the patient's husband back, Jennifer [name] and reminded him that his wife has not been seen in over a year and we needed to schedule a follow up.  He stated she is not able to leave the house.  I asked if she could do a video visit and he stated he did not think so.  I advised him that I would refill the prednisone and send a message to Dr. Isaiah Serge to see how he wanted to proceed with refilling her prednisone.  He verbalized understanding.  Dr. Isaiah Serge, I have refilled her prednisone, she has not been seen in our office in over a year and her husband states she is not able to leave the house or do a video visit.  Please advise on how we are do handle refills of prednisone going forward.  Thank you.

## 2023-04-27 ENCOUNTER — Other Ambulatory Visit: Payer: Self-pay | Admitting: Cardiovascular Disease

## 2023-04-27 ENCOUNTER — Telehealth: Payer: Self-pay | Admitting: Cardiovascular Disease

## 2023-04-27 NOTE — Telephone Encounter (Signed)
 Patient identification verified by 2 forms. [name], RN     Called and spoke to patient's spouse Jennifer [name] states:  - patient needs refills of amlodipine but unable to pick up prescription as neither he or patient drives and patient is on 24/7 oxygen.  - If we are unable to get the prescription to them then he will have to call someone else.  - also unable to afford to pay for full prescription at this time.           Nurse states: - Informed Jennifer [name] that the only way for him to receive the medication is by picking it up at the pharmacy or having it mailed to him through mail order pharmacy. Instructed patient he could call his insurance company to see which pharmacy would be covered and have amlodipine filled through there moving forward.  - Informed patient he can call pharmacy and request specific amount of prescription, he does not have to pick up all 15 tablets at once.   Interventions/Plan: - This RN, will contact [employer] tomorrow to see if we can assist patient with getting mail order processed. - Patient overdue for OV but unable leave home or schedule at this time. May need referral for home health.  - Encounter forwarded to primary cardiologist for review and recommendations.    Jennifer [name] agrees with plan, no questions at this time

## 2023-04-27 NOTE — Telephone Encounter (Signed)
 Pt c/o medication issue:  1. Name of Medication:   2. How are you currently taking this medication (dosage and times per day)?  amLODipine (NORVASC) 10 MG tablet   3. Are you having a reaction (difficulty breathing--STAT)? no  4. What is your medication issue? Husband called [facility] for a refill but appt need to be made in order to get refill. Husband states that they are unable to come. Patient is on 24/7 oxygen and neither one of them drives. Husband state patient really does need medication. Please advise

## 2023-04-28 NOTE — Telephone Encounter (Signed)
This is a Nurse, mental health pt

## 2023-04-29 NOTE — Telephone Encounter (Signed)
 Spoke with husband.  Jennifer [name] is now on hospice care and they are taking care of her meds.  Husband states meds have arrived and he has no further questions

## 2023-11-16 DEATH — deceased
